# Patient Record
Sex: Male | Born: 1950
Health system: Southern US, Community
[De-identification: ages and names within clinical notes are randomized; demographics above are authoritative.]

## PROBLEM LIST (undated history)

## (undated) DIAGNOSIS — J40 Bronchitis, not specified as acute or chronic: Secondary | ICD-10-CM

## (undated) DIAGNOSIS — F5104 Psychophysiologic insomnia: Secondary | ICD-10-CM

## (undated) DIAGNOSIS — R609 Edema, unspecified: Secondary | ICD-10-CM

## (undated) DIAGNOSIS — M25512 Pain in left shoulder: Secondary | ICD-10-CM

## (undated) DIAGNOSIS — I10 Essential (primary) hypertension: Secondary | ICD-10-CM

## (undated) DIAGNOSIS — Z9889 Other specified postprocedural states: Secondary | ICD-10-CM

## (undated) DIAGNOSIS — Z87442 Personal history of urinary calculi: Secondary | ICD-10-CM

## (undated) DIAGNOSIS — M1990 Unspecified osteoarthritis, unspecified site: Secondary | ICD-10-CM

## (undated) DIAGNOSIS — I499 Cardiac arrhythmia, unspecified: Secondary | ICD-10-CM

## (undated) DIAGNOSIS — G4733 Obstructive sleep apnea (adult) (pediatric): Secondary | ICD-10-CM

## (undated) DIAGNOSIS — R030 Elevated blood-pressure reading, without diagnosis of hypertension: Secondary | ICD-10-CM

## (undated) DIAGNOSIS — I4891 Unspecified atrial fibrillation: Secondary | ICD-10-CM

## (undated) DIAGNOSIS — R6889 Other general symptoms and signs: Secondary | ICD-10-CM

## (undated) DIAGNOSIS — G8929 Other chronic pain: Secondary | ICD-10-CM

## (undated) DIAGNOSIS — R112 Nausea with vomiting, unspecified: Secondary | ICD-10-CM

## (undated) DIAGNOSIS — J449 Chronic obstructive pulmonary disease, unspecified: Secondary | ICD-10-CM

## (undated) DIAGNOSIS — J45909 Unspecified asthma, uncomplicated: Secondary | ICD-10-CM

## (undated) DIAGNOSIS — G4730 Sleep apnea, unspecified: Secondary | ICD-10-CM

## (undated) DIAGNOSIS — M1710 Unilateral primary osteoarthritis, unspecified knee: Secondary | ICD-10-CM

## (undated) HISTORY — DX: Other chronic pain: G89.29

## (undated) HISTORY — DX: Sleep apnea, unspecified: G47.30

## (undated) HISTORY — DX: Other general symptoms and signs: R68.89

## (undated) HISTORY — DX: Chronic obstructive pulmonary disease, unspecified: J44.9

## (undated) HISTORY — PX: OTHER SURGICAL HISTORY: SHX169

## (undated) HISTORY — DX: Pain in left shoulder: M25.512

## (undated) HISTORY — DX: Unilateral primary osteoarthritis, unspecified knee: M17.10

## (undated) HISTORY — DX: Unspecified asthma, uncomplicated: J45.909

## (undated) HISTORY — PX: BILATERAL KNEE ARTHROSCOPY: SUR91

## (undated) HISTORY — DX: Unspecified atrial fibrillation: I48.91

## (undated) HISTORY — DX: Elevated blood-pressure reading, without diagnosis of hypertension: R03.0

## (undated) HISTORY — PX: TONSILLECTOMY AND ADENOIDECTOMY: SHX28

## (undated) HISTORY — DX: Psychophysiologic insomnia: F51.04

## (undated) HISTORY — DX: Morbid (severe) obesity due to excess calories: E66.01

---

## 2002-05-05 ENCOUNTER — Emergency Department (HOSPITAL_COMMUNITY): Admission: EM | Admit: 2002-05-05 | Discharge: 2002-05-05 | Payer: Self-pay | Admitting: Emergency Medicine

## 2002-05-05 ENCOUNTER — Encounter: Payer: Self-pay | Admitting: Emergency Medicine

## 2004-10-08 ENCOUNTER — Ambulatory Visit (HOSPITAL_BASED_OUTPATIENT_CLINIC_OR_DEPARTMENT_OTHER): Admission: RE | Admit: 2004-10-08 | Discharge: 2004-10-08 | Payer: Self-pay | Admitting: Orthopedic Surgery

## 2004-10-08 ENCOUNTER — Ambulatory Visit (HOSPITAL_COMMUNITY): Admission: RE | Admit: 2004-10-08 | Discharge: 2004-10-08 | Payer: Self-pay | Admitting: Orthopedic Surgery

## 2007-04-10 ENCOUNTER — Encounter: Payer: Self-pay | Admitting: Internal Medicine

## 2007-11-30 ENCOUNTER — Ambulatory Visit (HOSPITAL_BASED_OUTPATIENT_CLINIC_OR_DEPARTMENT_OTHER): Admission: RE | Admit: 2007-11-30 | Discharge: 2007-11-30 | Payer: Self-pay | Admitting: Orthopedic Surgery

## 2008-02-26 ENCOUNTER — Encounter: Payer: Self-pay | Admitting: Internal Medicine

## 2008-02-29 ENCOUNTER — Encounter: Payer: Self-pay | Admitting: Pulmonary Disease

## 2008-02-29 DIAGNOSIS — R6889 Other general symptoms and signs: Secondary | ICD-10-CM

## 2008-03-14 ENCOUNTER — Ambulatory Visit: Payer: Self-pay | Admitting: Pulmonary Disease

## 2008-03-14 DIAGNOSIS — J449 Chronic obstructive pulmonary disease, unspecified: Secondary | ICD-10-CM

## 2008-03-14 DIAGNOSIS — J4489 Other specified chronic obstructive pulmonary disease: Secondary | ICD-10-CM

## 2008-03-14 HISTORY — DX: Chronic obstructive pulmonary disease, unspecified: J44.9

## 2008-03-14 HISTORY — DX: Other specified chronic obstructive pulmonary disease: J44.89

## 2008-04-10 ENCOUNTER — Ambulatory Visit: Payer: Self-pay | Admitting: Internal Medicine

## 2008-04-18 ENCOUNTER — Ambulatory Visit: Payer: Self-pay | Admitting: Internal Medicine

## 2008-05-23 ENCOUNTER — Ambulatory Visit: Payer: Self-pay | Admitting: Internal Medicine

## 2008-05-24 ENCOUNTER — Telehealth (INDEPENDENT_AMBULATORY_CARE_PROVIDER_SITE_OTHER): Payer: Self-pay | Admitting: *Deleted

## 2009-01-15 ENCOUNTER — Telehealth (INDEPENDENT_AMBULATORY_CARE_PROVIDER_SITE_OTHER): Payer: Self-pay | Admitting: *Deleted

## 2009-01-24 ENCOUNTER — Telehealth (INDEPENDENT_AMBULATORY_CARE_PROVIDER_SITE_OTHER): Payer: Self-pay | Admitting: *Deleted

## 2010-06-30 NOTE — Op Note (Signed)
NAME:  Brent Dougherty, Brent Dougherty NO.:  [id number]   MEDICAL RECORD NO.:  [id number]          PATIENT TYPE:  AMB   LOCATION:  DSC                          FACILITY:  [facility]   PHYSICIAN:  Loreta Ave, M.D. DATE OF BIRTH:  03/08/1950   DATE OF PROCEDURE:  11/30/2007  DATE OF DISCHARGE:                               OPERATIVE REPORT   PREOPERATIVE DIAGNOSIS:  Right knee medial meniscus tear.   POSTOPERATIVE DIAGNOSIS:  Right knee medial and lateral meniscus tears  with tricompartmental grade 3 chondromalacia and grade 4 changes of  lateral compartment.   PROCEDURES:  Right knee exam under anesthesia, arthroscopy, partial  medial and lateral meniscectomy.  Tricompartmental chondroplasty,  partial synovectomy.  Removal of chondral loose bodies.   SURGEON:  Loreta Ave, MD   ASSISTANT:  Genene Churn. Barry Dienes, PA   ANESTHESIA:  General.   ESTIMATED BLOOD LOSS:  Minimal.   SPECIMENS:  None.   COUNTS:  None.   COMPLICATIONS:  None.   DRESSING:  Sterile compressive.   TOURNIQUET:  Not employed.   PROCEDURE IN DETAIL:  The patient was brought to the operating room and  placed on operating table in supine position.  After adequate anesthesia  had been obtained, the knee was examined.  Because of his size, it was  difficult but eventually full motion with stable knee.  Tourniquet and  leg holder applied.  Leg prepped and draped in the usual sterile  fashion.  Three portals were created, one superolateral, one each medial  and lateral parapatellar.  Inflow catheter was introduced into the knee.  Standard arthroscope was introduced and the knee was inspected.  Extensive grade 3 changes in all 3 compartments.  Chondroplasty to a  stable surface in all compartments.  Most marked changes were laterally  where he actually has some grade 4 about the tibia and femur on the  medial half.  Medial meniscus, he had a previous partial medial  meniscectomy.  What was left on the  posterior middle third had some  degenerative tearing and fraying throughout.  Taken out to a stable rim  and tapered down smoothly.  Cruciate ligament was intact.  A lot of  periarticular spurring throughout the knee.  On the lateral side,  detachment of the posterior and lateral meniscus with intrameniscal  tearing throughout.  Most of posterior half removed and tapered into  remaining meniscus removing all uneven unstable flaps.  Chondroplasty to  a stable surface throughout.  At completion, all recess examined to make  sure all meniscal fragments and loose bodies were removed.  On the  lateral  side, the changes were on both sides.  The joint and microfracturing not  indicated.  Instruments and fluid removed.  Portals of knee injected  with Marcaine.  Portals were closed with 4-0 nylon.  Sterile compressive  dressing applied.  Anesthesia was reversed.  Brought to recovery room.  Tolerated the surgery well.  No complications.      Loreta Ave, M.D.  Electronically Signed     DFM/MEDQ  D:  11/30/2007  T:  11/30/2007  Job:  [id number]

## 2010-07-03 NOTE — Op Note (Signed)
NAME:  Brent Dougherty, Brent Dougherty NO.:  [id number]   MEDICAL RECORD NO.:  [id number]          PATIENT TYPE:  AMB   LOCATION:  DSC                          FACILITY:  [facility]   PHYSICIAN:  Loreta Ave, M.D. DATE OF BIRTH:  06-Jun-1950   DATE OF PROCEDURE:  10/08/2004  DATE OF DISCHARGE:                                 OPERATIVE REPORT   PREOPERATIVE DIAGNOSIS:  Medial meniscal tear, left knee.   POSTOPERATIVE DIAGNOSIS:  Medial meniscal tear, left knee, with fraying of  anterior horn of lateral meniscus and grade 2/mild grade 3 changes, lateral  compartment.  Also focal grade 2-3 chondromalacia of lateral patellar facet.   PROCEDURE:  Left knee exam under anesthesia, arthroscopy with debridement of  medial and lateral meniscus tear.  Chondroplasty of lateral tibial plateau  and patella.   SURGEON:  Loreta Ave, M.D.   ASSISTANT:  Genene Churn. Denton Meek.   ANESTHESIA:  Knee block with sedation.   SPECIMENS:  None.   CULTURES:  None.   COMPLICATIONS:  None.   DRESSINGS:  Soft compressive.   PROCEDURE:  Patient brought to the operating room and placed on operating  table in supine position.  After adequate anesthesia had been obtained, left  knee examined.  Full motion, good stability.  A little patellofemoral  crepitus and positive medial McMurray's.  Tourniquet and leg holder applied,  leg prepped and draped in the usual sterile fashion.  Three portals created,  one superolateral, one each medial and lateral parapatellar.  Inflow  catheter introduced, knee distended, arthroscope introduced, knee inspected.  Some hypertrophic grade 3 chondromalacia of lateral patellar facet debrided.  Trochlea looked good.  Remaining cartilage looked good.  Tracking good.  Cruciate ligament was intact.  Medial compartment had some grade 2 changes,  not extensive.  Marked complex tear of medial meniscus, entire posterior  half, with some of the fragments flipped up behind the  posterior cruciate  ligament in the notch.  Entire posterior half removed after a stable rim  tapered into remaining meniscus.  Laterally, there were some grade 3 changes  of the plateau debrided.  Frayed flap tears off the anterior horn, lateral  meniscus debrided.  Remaining meniscus entire.  Lateral femoral condyle  looked good.  At completion, all recess examined.  No findings  appreciated.  Instruments and fluid removed.  Portals in knee injected with  Marcaine.  Portals closed with 4-0 nylon.  Sterile compressive dressing  applied.  Anesthesia reversed.  Brought to recovery room.  Tolerated surgery  well with no complications.      Loreta Ave, M.D.  Electronically Signed     DFM/MEDQ  D:  10/08/2004  T:  10/09/2004  Job:  [id number]

## 2010-11-16 LAB — POCT HEMOGLOBIN-HEMACUE: Hemoglobin: 15.6

## 2011-11-18 ENCOUNTER — Other Ambulatory Visit: Payer: Self-pay | Admitting: Orthopedic Surgery

## 2011-11-18 MED ORDER — BUPIVACAINE 0.25 % ON-Q PUMP SINGLE CATH 300ML: 300.0000 mL | INJECTION | Status: DC

## 2011-11-18 MED ORDER — DEXAMETHASONE SODIUM PHOSPHATE 10 MG/ML IJ SOLN: 10.0000 mg | Freq: Once | INTRAMUSCULAR | Status: DC

## 2011-11-18 NOTE — Progress Notes (Signed)
Preoperative surgical orders have been place into the [facility] hospital system for Brent Dougherty on 11/18/2011, 2:09 PM  by Patrica Duel for surgery on 12/27/2011.  Preop Total Knee orders including Bupivacaine On-Q pump, IV Tylenol, and IV Decadron as long as there are no contraindications to the above medications. Avel Peace, PA-C

## 2012-02-07 ENCOUNTER — Other Ambulatory Visit: Payer: Self-pay | Admitting: Orthopedic Surgery

## 2012-02-07 MED ORDER — DEXAMETHASONE SODIUM PHOSPHATE 10 MG/ML IJ SOLN: 10.0000 mg | Freq: Once | INTRAMUSCULAR | Status: DC

## 2012-02-07 MED ORDER — BUPIVACAINE LIPOSOME 1.3 % IJ SUSP: 20.0000 mL | Freq: Once | INTRAMUSCULAR | Status: DC

## 2012-02-07 NOTE — Progress Notes (Signed)
Preoperative surgical orders have been place into the Epic hospital system for Brent Dougherty on 02/07/2012, 12:55 PM  by Patrica Duel for surgery on 03/08/2012.  Preop Total Knee orders including Experal, IV Tylenol, and IV Decadron as long as there are no contraindications to the above medications. Avel Peace, PA-C

## 2012-05-23 ENCOUNTER — Other Ambulatory Visit: Payer: Self-pay | Admitting: Orthopedic Surgery

## 2012-05-23 MED ORDER — TRANEXAMIC ACID 100 MG/ML IV SOLN: 1000.0000 mg | INTRAVENOUS | Status: DC

## 2012-05-31 NOTE — Progress Notes (Signed)
Need orders please - pt coming for preop 06/13/12 thank you 

## 2012-06-01 ENCOUNTER — Other Ambulatory Visit: Payer: Self-pay | Admitting: Orthopedic Surgery

## 2012-06-01 MED ORDER — DEXAMETHASONE SODIUM PHOSPHATE 10 MG/ML IJ SOLN: 10.0000 mg | Freq: Once | INTRAMUSCULAR | Status: DC

## 2012-06-01 MED ORDER — BUPIVACAINE LIPOSOME 1.3 % IJ SUSP: 20.0000 mL | Freq: Once | INTRAMUSCULAR | Status: DC

## 2012-06-01 NOTE — Progress Notes (Signed)
Preoperative surgical orders have been place into the Epic hospital system for Brent Dougherty on 06/01/2012, 1:01 PM  by Patrica Duel for surgery on 06/19/2012.  Preop Total Knee orders including Experal, IV Tylenol, and IV Decadron as long as there are no contraindications to the above medications. Avel Peace, PA-C

## 2012-06-02 ENCOUNTER — Encounter (HOSPITAL_COMMUNITY): Payer: Self-pay | Admitting: Pharmacy Technician

## 2012-06-13 ENCOUNTER — Encounter (HOSPITAL_COMMUNITY)
Admission: RE | Admit: 2012-06-13 | Discharge: 2012-06-13 | Disposition: A | Discharge status: Home / Self Care | Payer: BC Managed Care – PPO | Source: Ambulatory Visit | Attending: Orthopedic Surgery | Admitting: Orthopedic Surgery

## 2012-06-13 ENCOUNTER — Ambulatory Visit (HOSPITAL_COMMUNITY)
Admission: RE | Admit: 2012-06-13 | Discharge: 2012-06-13 | Disposition: A | Discharge status: Home / Self Care | Payer: BC Managed Care – PPO | Source: Ambulatory Visit | Attending: Orthopedic Surgery | Admitting: Orthopedic Surgery

## 2012-06-13 ENCOUNTER — Encounter (HOSPITAL_COMMUNITY): Payer: Self-pay

## 2012-06-13 DIAGNOSIS — Z01810 Encounter for preprocedural cardiovascular examination: Secondary | ICD-10-CM | POA: Insufficient documentation

## 2012-06-13 DIAGNOSIS — Z01818 Encounter for other preprocedural examination: Secondary | ICD-10-CM | POA: Insufficient documentation

## 2012-06-13 DIAGNOSIS — M47814 Spondylosis without myelopathy or radiculopathy, thoracic region: Secondary | ICD-10-CM | POA: Insufficient documentation

## 2012-06-13 DIAGNOSIS — I10 Essential (primary) hypertension: Secondary | ICD-10-CM | POA: Insufficient documentation

## 2012-06-13 HISTORY — DX: Other specified postprocedural states: Z98.890

## 2012-06-13 HISTORY — DX: Personal history of urinary calculi: Z87.442

## 2012-06-13 HISTORY — DX: Unspecified osteoarthritis, unspecified site: M19.90

## 2012-06-13 HISTORY — DX: Other specified postprocedural states: R11.2

## 2012-06-13 HISTORY — DX: Bronchitis, not specified as acute or chronic: J40

## 2012-06-13 LAB — COMPREHENSIVE METABOLIC PANEL
ALT: 18 U/L (ref 0–53)
AST: 18 U/L (ref 0–37)
Alkaline Phosphatase: 56 U/L (ref 39–117)
CO2: 29 mEq/L (ref 19–32)
Calcium: 9.7 mg/dL (ref 8.4–10.5)
GFR calc non Af Amer: >90 mL/min (ref >90)
Glucose, Bld: 93 mg/dL (ref 70–99)
Potassium: 4.4 mEq/L (ref 3.5–5.1)
Sodium: 140 mEq/L (ref 135–145)

## 2012-06-13 LAB — URINALYSIS, ROUTINE W REFLEX MICROSCOPIC
Glucose, UA: NEGATIVE mg/dL
Hgb urine dipstick: NEGATIVE
Ketones, ur: NEGATIVE mg/dL
Leukocytes, UA: NEGATIVE
pH: 6.5 (ref 5.0–8.0)

## 2012-06-13 LAB — APTT: aPTT: 33 seconds (ref 24–37)

## 2012-06-13 LAB — CBC
Hemoglobin: 15.2 g/dL (ref 13.0–17.0)
MCH: 29.8 pg (ref 26.0–34.0)
Platelets: 306 10*3/uL (ref 150–400)
RBC: 5.1 MIL/uL (ref 4.22–5.81)
WBC: 6.2 10*3/uL (ref 4.0–10.5)

## 2012-06-13 LAB — SURGICAL PCR SCREEN
MRSA, PCR: NEGATIVE
Staphylococcus aureus: POSITIVE — AB

## 2012-06-13 NOTE — Patient Instructions (Signed)
YOUR SURGERY IS SCHEDULED AT [facility]  ON:  Monday 5/5  REPORT TO [place] [facility] AT:  5:00 AM      PHONE # FOR SHORT STAY IS [phone]  DO NOT EAT OR DRINK ANYTHING AFTER MIDNIGHT THE NIGHT BEFORE YOUR SURGERY.  YOU MAY BRUSH YOUR TEETH, RINSE OUT YOUR MOUTH--BUT NO WATER, NO FOOD, NO CHEWING GUM, NO MINTS, NO CANDIES, NO CHEWING TOBACCO.  PLEASE TAKE THE FOLLOWING MEDICATIONS THE AM OF YOUR SURGERY WITH A FEW SIPS OF WATER:  NO MEDICINES TO TAKE    DO NOT BRING VALUABLES, MONEY, CREDIT CARDS.  DO NOT WEAR JEWELRY, MAKE-UP, NAIL POLISH AND NO METAL PINS OR CLIPS IN YOUR HAIR. CONTACT LENS, DENTURES / PARTIALS, GLASSES SHOULD NOT BE WORN TO SURGERY AND IN MOST CASES-HEARING AIDS WILL NEED TO BE REMOVED.  BRING YOUR GLASSES CASE, ANY EQUIPMENT NEEDED FOR YOUR CONTACT LENS. FOR PATIENTS ADMITTED TO THE HOSPITAL--CHECK OUT TIME THE DAY OF DISCHARGE IS 11:00 AM.  ALL INPATIENT ROOMS ARE PRIVATE - WITH BATHROOM, TELEPHONE, TELEVISION AND WIFI INTERNET.                              PLEASE READ OVER ANY  FACT SHEETS THAT YOU WERE GIVEN: MRSA INFORMATION, BLOOD TRANSFUSION INFORMATION, INCENTIVE SPIROMETER INFORMATION. FAILURE TO FOLLOW THESE INSTRUCTIONS MAY RESULT IN THE CANCELLATION OF YOUR SURGERY.   PATIENT SIGNATURE_________________________________

## 2012-06-13 NOTE — Progress Notes (Signed)
06/13/12 1627  OBSTRUCTIVE SLEEP APNEA  Have you ever been diagnosed with sleep apnea through a sleep study? No  Do you snore loudly (loud enough to be heard through closed doors)?  0  Do you often feel tired, fatigued, or sleepy during the daytime? 0  Has anyone observed you stop breathing during your sleep? 0  Do you have, or are you being treated for high blood pressure? 0  BMI more than 35 kg/m2? 1  Age over 61 years old? 1  Neck circumference greater than 40 cm/18 inches? 1  Gender: 1  Obstructive Sleep Apnea Score 4  Score 4 or greater  Results sent to PCP

## 2012-06-13 NOTE — Pre-Procedure Instructions (Signed)
EKG AND CXR WERE DONE TODAY - PREOP - AT [facility] - PT'S BMI 44.34, B/P ELEVATED AT 147/92 - NOT ON ANY B/P MEDICATIONS, HX OF PROBLEMS WITH BRONCHITIS.

## 2012-06-15 NOTE — Pre-Procedure Instructions (Signed)
NOTE OF MEDICAL CLEARANCE FROM DR. Andrey Campanile  FOR KNEE SURGERY -FAXED BY DR. ALUISIO'S OFFICE AND PLACED ON PT'S CHART.

## 2012-06-18 ENCOUNTER — Other Ambulatory Visit: Payer: Self-pay | Admitting: Orthopedic Surgery

## 2012-06-18 MED ORDER — TRANEXAMIC ACID 100 MG/ML IV SOLN: 1000.0000 mg | INTRAVENOUS | Status: DC

## 2012-06-18 NOTE — H&P (Signed)
Clarene Dougherty. Dura  DOB: December 07, 1950 Married / Language: English / Race: White Male  Date of Admission:  06/19/2012  Chief Complaint:  Right Knee Pain  History of Present Illness( The patient is a 62 year old male who comes in for a preoperative History and Physical. The patient is scheduled for a right total knee arthroplasty to be performed by Dr. Gus Rankin. Aluisio, MD at [facility] on 06/19/2012. The patient is a 62 year old male who presents with knee complaints. The patient is seen for a second opinion. The patient reports right knee symptoms including: pain and swelling which began 13 year(s) ago following a specific injury. Note for "Knee pain": He had a knee scope by Dr. Eulah Pont in 2009. More recently, he had a cortisone injection last June and was told he would need to have a total knee surgery. He was initially seen by Dr. Eulah Pont under workman's comp. He had the scope to try to prolong total knee replacement. He only got relief for a year with the scope. He had cortisone injections over the course of a year and 1/2. He states he had aspirations each time, but he would swell up the very next day. He is here now, under his personal insurance, seeking help. He states that the knee is getting progressively worse. It hurts at all times. It is starting to limit what he can and cannot do. The knee wants to give out at times. He has had cortisone and viscosupplement injections and they have not helped. He states that the knee occasionally will swell up on him. It feels like it wants to give out. It has not actually locked up or completely given out. Left knee hurts some but no where near as bad as the right. He is not having any hip pain, back pain, lower extremity weakness or paresthesia currently. He is ready to proceed with surgery. They have been treated conservatively in the past for the above stated problem and despite conservative measures, they continue to have  progressive pain and severe functional limitations and dysfunction. They have failed non-operative management including home exercise, medications, and injections. It is felt that they would benefit from undergoing total joint replacement. Risks and benefits of the procedure have been discussed with the patient and they elect to proceed with surgery. There are no active contraindications to surgery such as ongoing infection or rapidly progressive neurological disease.     Problem List Osteoarthritis, Knee (715.96)   Allergies Morphine Sulfate (Concentrate) *ANALGESICS - OPIOID*. Mild Nausea   Family History Rheumatoid Arthritis. sister Heart disease in male family member before age 54 Heart Disease. 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 4-5 Marital status. married Illicit drug use. no Living situation. live with spouse Tobacco use. never smoker Pain Contract. no Previously in rehab. no Drug/Alcohol Rehab (Currently). no Exercise. Exercises weekly; does other Current work status. working full time Alcohol use. never consumed alcohol Children. 3   Medication History Advil (200MG  Capsule, Oral) Active. (PRN)   Past Surgical History Arthroscopy of Knee. bilateral   Medical History Kidney Stone Bronchitis Measles   Review of Systems General:Not Present- Chills, Fever, Night Sweats, Fatigue, Weight Gain, Weight Loss and Memory Loss. Skin:Not Present- Hives, Itching, Rash, Eczema and Lesions. HEENT:Not Present- Tinnitus, Headache, Double Vision, Visual Loss, Hearing Loss and Dentures. Respiratory:Not Present- Shortness of breath with exertion, Shortness of breath at rest, Allergies,  Coughing up blood and Chronic Cough. Cardiovascular:Not Present- Chest Pain, Racing/skipping heartbeats, Difficulty Breathing Lying Down, Murmur, Swelling  and Palpitations. Gastrointestinal:Not Present- Bloody Stool, Heartburn, Abdominal Pain, Vomiting, Nausea, Constipation, Diarrhea, Difficulty Swallowing, Jaundice and Loss of appetitie. Male Genitourinary:Not Present- Urinary frequency, Blood in Urine, Weak urinary stream, Discharge, Flank Pain, Incontinence, Painful Urination, Urgency, Urinary Retention and Urinating at Night. Musculoskeletal:Present- Joint Swelling and Joint Pain. Not Present- Muscle Weakness, Muscle Pain, Back Pain, Morning Stiffness and Spasms. Neurological:Not Present- Tremor, Dizziness, Blackout spells, Paralysis, Difficulty with balance and Weakness. Psychiatric:Not Present- Insomnia.   Vitals Weight: 335 lb Height: 72 in Weight was reported by patient. Height was reported by patient. Body Surface Area: 2.78 m Body Mass Index: 45.43 kg/m Pulse: 64 (Regular) Resp.: 12 (Unlabored) BP: 150/78 (Sitting, Right Arm, Standard)    Physical Exam The physical exam findings are as follows:  Note: Patient is a 62 year old male with continued knee pain. Patient is accompanied today by his wife, Neysa Bonito.   General Mental Status - Alert, cooperative and good historian. General Appearance- pleasant. Not in acute distress. Orientation- Oriented X3. Build & Nutrition- Well nourished and Well developed.   Head and Neck Head- normocephalic, atraumatic . Neck Global Assessment- supple. no bruit auscultated on the right and no bruit auscultated on the left.   Eye Pupil- Bilateral- Regular and Round. Motion- Bilateral- EOMI.   Chest and Lung Exam Auscultation: Breath sounds:- clear at anterior chest wall and - clear at posterior chest wall. Adventitious sounds:- No Adventitious sounds.   Cardiovascular Auscultation:Rhythm- Regular rate and rhythm. Heart Sounds- S1 WNL and S2 WNL. Murmurs & Other Heart Sounds:Auscultation of the heart reveals - No  Murmurs.   Abdomen Inspection:Contour- Generalized moderate distention. Palpation/Percussion:Tenderness- Abdomen is non-tender to palpation. Rigidity (guarding)- Abdomen is soft. Auscultation:Auscultation of the abdomen reveals - Bowel sounds normal.   Male Genitourinary Not done, not pertinent to present illness  Musculoskeletal Alert and oriented in no apparent distress. Both hips have normal range of motion with no discomfort. Left knee no effusion. Range is 0-125. Minimal crepitus on range of motion. He has no joint line tenderness or instability. Right knee no effusion. There is some soft tissue thickening about the knee. Range is about 5-115. Marked crepitus on range of motion with tenderness medial greater than lateral and no instability noted. He has 1+ peripheral edema both legs. There is no evidence of any stasis changes.  RADIOGRAPHS: AP both knees and lateral show advanced end stage arthritis of the right knee. He is bone on bone medial and significant narrowing laterally and bone on bone patellofemoral. The left knee has some changes but no where near as bad.  Assessment & Plan Osteoarthritis, Knee (715.96) Impression: Right Knee  Note: Plan is for a Right Total Knee Replacement by Dr. Lequita Halt.  Plan is to go home.   Signed electronically by Roberts Gaudy, PA-C

## 2012-06-19 ENCOUNTER — Encounter (HOSPITAL_COMMUNITY): Payer: Self-pay | Admitting: *Deleted

## 2012-06-19 ENCOUNTER — Inpatient Hospital Stay (HOSPITAL_COMMUNITY): Payer: BC Managed Care – PPO | Admitting: *Deleted

## 2012-06-19 ENCOUNTER — Inpatient Hospital Stay (HOSPITAL_COMMUNITY)
Admission: RE | Admit: 2012-06-19 | Discharge: 2012-06-21 | DRG: 209 | Disposition: A | Discharge status: Home Health | Payer: BC Managed Care – PPO | Source: Ambulatory Visit | Attending: Orthopedic Surgery | Admitting: Orthopedic Surgery

## 2012-06-19 ENCOUNTER — Encounter (HOSPITAL_COMMUNITY)
Admission: RE | Disposition: A | Discharge status: Home Health | Payer: Self-pay | Source: Ambulatory Visit | Attending: Orthopedic Surgery

## 2012-06-19 DIAGNOSIS — M1710 Unilateral primary osteoarthritis, unspecified knee: Principal | ICD-10-CM | POA: Diagnosis present

## 2012-06-19 DIAGNOSIS — M179 Osteoarthritis of knee, unspecified: Secondary | ICD-10-CM | POA: Diagnosis present

## 2012-06-19 DIAGNOSIS — Z96651 Presence of right artificial knee joint: Secondary | ICD-10-CM

## 2012-06-19 HISTORY — PX: TOTAL KNEE ARTHROPLASTY: SHX125

## 2012-06-19 LAB — TYPE AND SCREEN: ABO/RH(D): A POS

## 2012-06-19 LAB — ABO/RH: ABO/RH(D): A POS

## 2012-06-19 SURGERY — ARTHROPLASTY, KNEE, TOTAL
Anesthesia: Spinal | Site: Knee | Laterality: Right | Wound class: Clean

## 2012-06-19 MED ORDER — ONDANSETRON HCL 4 MG PO TABS: 4.0000 mg | ORAL_TABLET | Freq: Four times a day (QID) | ORAL | Status: DC | PRN

## 2012-06-19 MED ORDER — OXYCODONE HCL 5 MG PO TABS
5.0000 mg | ORAL_TABLET | ORAL | Status: DC | PRN
  Administered 2012-06-19: 10 mg via ORAL
  Administered 2012-06-19 (×2): 5 mg via ORAL
  Administered 2012-06-19 – 2012-06-20 (×7): 10 mg via ORAL
  Administered 2012-06-21 (×2): 5 mg via ORAL
  Filled 2012-06-19 (×2): qty 2
  Filled 2012-06-19: qty 1
  Filled 2012-06-19 (×3): qty 2
  Filled 2012-06-19: qty 1
  Filled 2012-06-19 (×2): qty 2
  Filled 2012-06-19 (×2): qty 1
  Filled 2012-06-19 (×2): qty 2

## 2012-06-19 MED ORDER — METHOCARBAMOL 500 MG PO TABS
500.0000 mg | ORAL_TABLET | Freq: Four times a day (QID) | ORAL | Status: DC | PRN
  Administered 2012-06-19 – 2012-06-21 (×6): 500 mg via ORAL
  Filled 2012-06-19 (×6): qty 1

## 2012-06-19 MED ORDER — METOCLOPRAMIDE HCL 10 MG PO TABS: 5.0000 mg | ORAL_TABLET | Freq: Three times a day (TID) | ORAL | Status: DC | PRN

## 2012-06-19 MED ORDER — FLEET ENEMA 7-19 GM/118ML RE ENEM: 1.0000 | ENEMA | Freq: Once | RECTAL | Status: AC | PRN

## 2012-06-19 MED ORDER — PHENOL 1.4 % MT LIQD: 1.0000 | OROMUCOSAL | Status: DC | PRN

## 2012-06-19 MED ORDER — FENTANYL CITRATE 0.05 MG/ML IJ SOLN
INTRAMUSCULAR | Status: DC | PRN
  Administered 2012-06-19: 100 ug via INTRAVENOUS

## 2012-06-19 MED ORDER — DEXAMETHASONE SODIUM PHOSPHATE 10 MG/ML IJ SOLN
10.0000 mg | Freq: Every day | INTRAMUSCULAR | Status: AC
  Administered 2012-06-20: 10 mg via INTRAVENOUS
  Filled 2012-06-19: qty 1

## 2012-06-19 MED ORDER — CEFAZOLIN SODIUM-DEXTROSE 2-3 GM-% IV SOLR
2.0000 g | Freq: Four times a day (QID) | INTRAVENOUS | Status: AC
  Administered 2012-06-19 (×2): 2 g via INTRAVENOUS
  Filled 2012-06-19 (×2): qty 50

## 2012-06-19 MED ORDER — ACETAMINOPHEN 10 MG/ML IV SOLN
1000.0000 mg | Freq: Once | INTRAVENOUS | Status: AC
  Administered 2012-06-19: 1000 mg via INTRAVENOUS

## 2012-06-19 MED ORDER — BISACODYL 10 MG RE SUPP: 10.0000 mg | Freq: Every day | RECTAL | Status: DC | PRN

## 2012-06-19 MED ORDER — SODIUM CHLORIDE 0.9 % IR SOLN
Status: DC | PRN
  Administered 2012-06-19: 1000 mL

## 2012-06-19 MED ORDER — TRAMADOL HCL 50 MG PO TABS: 50.0000 mg | ORAL_TABLET | Freq: Four times a day (QID) | ORAL | Status: DC | PRN

## 2012-06-19 MED ORDER — RIVAROXABAN 10 MG PO TABS
10.0000 mg | ORAL_TABLET | Freq: Every day | ORAL | Status: DC
  Administered 2012-06-20 – 2012-06-21 (×2): 10 mg via ORAL
  Filled 2012-06-19 (×3): qty 1

## 2012-06-19 MED ORDER — DOCUSATE SODIUM 100 MG PO CAPS
100.0000 mg | ORAL_CAPSULE | Freq: Two times a day (BID) | ORAL | Status: DC
  Administered 2012-06-19 – 2012-06-21 (×5): 100 mg via ORAL

## 2012-06-19 MED ORDER — DEXAMETHASONE 6 MG PO TABS
10.0000 mg | ORAL_TABLET | Freq: Every day | ORAL | Status: AC
  Filled 2012-06-19: qty 1

## 2012-06-19 MED ORDER — PROPOFOL 10 MG/ML IV BOLUS
INTRAVENOUS | Status: DC | PRN
  Administered 2012-06-19: 50 mg via INTRAVENOUS

## 2012-06-19 MED ORDER — MEPERIDINE HCL 50 MG/ML IJ SOLN: 6.2500 mg | INTRAMUSCULAR | Status: DC | PRN

## 2012-06-19 MED ORDER — LACTATED RINGERS IV SOLN: INTRAVENOUS | Status: DC

## 2012-06-19 MED ORDER — ONDANSETRON HCL 4 MG/2ML IJ SOLN
4.0000 mg | Freq: Four times a day (QID) | INTRAMUSCULAR | Status: DC | PRN
  Administered 2012-06-19: 4 mg via INTRAVENOUS
  Filled 2012-06-19: qty 2

## 2012-06-19 MED ORDER — POLYETHYLENE GLYCOL 3350 17 G PO PACK: 17.0000 g | PACK | Freq: Every day | ORAL | Status: DC | PRN

## 2012-06-19 MED ORDER — BUPIVACAINE HCL 0.25 % IJ SOLN
INTRAMUSCULAR | Status: DC | PRN
  Administered 2012-06-19: 20 mL

## 2012-06-19 MED ORDER — PROPOFOL 10 MG/ML IV EMUL
INTRAVENOUS | Status: DC | PRN
  Administered 2012-06-19: 100 ug/kg/min via INTRAVENOUS

## 2012-06-19 MED ORDER — DIPHENHYDRAMINE HCL 12.5 MG/5ML PO ELIX: 12.5000 mg | ORAL_SOLUTION | ORAL | Status: DC | PRN

## 2012-06-19 MED ORDER — MENTHOL 3 MG MT LOZG: 1.0000 | LOZENGE | OROMUCOSAL | Status: DC | PRN

## 2012-06-19 MED ORDER — HYDROMORPHONE HCL PF 1 MG/ML IJ SOLN
0.5000 mg | INTRAMUSCULAR | Status: DC | PRN
  Administered 2012-06-19: 0.5 mg via INTRAVENOUS
  Administered 2012-06-19: 1 mg via INTRAVENOUS
  Filled 2012-06-19 (×2): qty 1

## 2012-06-19 MED ORDER — BUPIVACAINE LIPOSOME 1.3 % IJ SUSP
20.0000 mL | Freq: Once | INTRAMUSCULAR | Status: DC
  Filled 2012-06-19: qty 20

## 2012-06-19 MED ORDER — DEXTROSE 5 % IV SOLN
3.0000 g | INTRAVENOUS | Status: AC
  Administered 2012-06-19: 3 g via INTRAVENOUS
  Filled 2012-06-19: qty 3000

## 2012-06-19 MED ORDER — ACETAMINOPHEN 650 MG RE SUPP: 650.0000 mg | Freq: Four times a day (QID) | RECTAL | Status: DC | PRN

## 2012-06-19 MED ORDER — HYDROMORPHONE HCL PF 1 MG/ML IJ SOLN: 0.2500 mg | INTRAMUSCULAR | Status: DC | PRN

## 2012-06-19 MED ORDER — DEXTROSE-NACL 5-0.9 % IV SOLN
INTRAVENOUS | Status: DC
  Administered 2012-06-19 – 2012-06-20 (×3): via INTRAVENOUS

## 2012-06-19 MED ORDER — ACETAMINOPHEN 10 MG/ML IV SOLN
1000.0000 mg | Freq: Four times a day (QID) | INTRAVENOUS | Status: AC
  Administered 2012-06-19 – 2012-06-20 (×4): 1000 mg via INTRAVENOUS
  Filled 2012-06-19 (×9): qty 100

## 2012-06-19 MED ORDER — PROMETHAZINE HCL 25 MG/ML IJ SOLN: 6.2500 mg | INTRAMUSCULAR | Status: DC | PRN

## 2012-06-19 MED ORDER — CHLORHEXIDINE GLUCONATE 4 % EX LIQD
60.0000 mL | Freq: Once | CUTANEOUS | Status: DC
  Filled 2012-06-19: qty 60

## 2012-06-19 MED ORDER — SODIUM CHLORIDE 0.9 % IJ SOLN
INTRAMUSCULAR | Status: DC | PRN
  Administered 2012-06-19: 08:00:00

## 2012-06-19 MED ORDER — ACETAMINOPHEN 325 MG PO TABS: 650.0000 mg | ORAL_TABLET | Freq: Four times a day (QID) | ORAL | Status: DC | PRN

## 2012-06-19 MED ORDER — METHOCARBAMOL 100 MG/ML IJ SOLN: 500.0000 mg | Freq: Four times a day (QID) | INTRAVENOUS | Status: DC | PRN

## 2012-06-19 MED ORDER — SODIUM CHLORIDE 0.9 % IV SOLN: INTRAVENOUS | Status: DC

## 2012-06-19 MED ORDER — TRANEXAMIC ACID 100 MG/ML IV SOLN
1000.0000 mg | INTRAVENOUS | Status: DC
  Filled 2012-06-19: qty 10

## 2012-06-19 MED ORDER — METOCLOPRAMIDE HCL 5 MG/ML IJ SOLN: 5.0000 mg | Freq: Three times a day (TID) | INTRAMUSCULAR | Status: DC | PRN

## 2012-06-19 MED ORDER — SCOPOLAMINE 1 MG/3DAYS TD PT72
MEDICATED_PATCH | TRANSDERMAL | Status: DC | PRN
  Administered 2012-06-19: 1 via TRANSDERMAL

## 2012-06-19 MED ORDER — 0.9 % SODIUM CHLORIDE (POUR BTL) OPTIME
TOPICAL | Status: DC | PRN
  Administered 2012-06-19: 1000 mL

## 2012-06-19 MED ORDER — MIDAZOLAM HCL 5 MG/5ML IJ SOLN
INTRAMUSCULAR | Status: DC | PRN
  Administered 2012-06-19 (×2): 2 mg via INTRAVENOUS

## 2012-06-19 MED ORDER — BUPIVACAINE IN DEXTROSE 0.75-8.25 % IT SOLN
INTRATHECAL | Status: DC | PRN
  Administered 2012-06-19: 2 mL via INTRATHECAL

## 2012-06-19 MED ORDER — LACTATED RINGERS IV SOLN
INTRAVENOUS | Status: DC | PRN
  Administered 2012-06-19 (×2): via INTRAVENOUS

## 2012-06-19 SURGICAL SUPPLY — 53 items
BAG SPEC THK2 15X12 ZIP CLS (MISCELLANEOUS) ×1
BAG ZIPLOCK 12X15 (MISCELLANEOUS) ×2 IMPLANT
BANDAGE ELASTIC 6 VELCRO ST LF (GAUZE/BANDAGES/DRESSINGS) ×2 IMPLANT
BANDAGE ESMARK 6X9 LF (GAUZE/BANDAGES/DRESSINGS) ×1 IMPLANT
BLADE SAG 18X100X1.27 (BLADE) ×2 IMPLANT
BLADE SAW SGTL 11.0X1.19X90.0M (BLADE) ×2 IMPLANT
BNDG CMPR 9X6 STRL LF SNTH (GAUZE/BANDAGES/DRESSINGS) ×1
BNDG ESMARK 6X9 LF (GAUZE/BANDAGES/DRESSINGS) ×2
BOWL SMART MIX CTS (DISPOSABLE) ×2 IMPLANT
CEMENT HV SMART SET (Cement) ×4 IMPLANT
CLOTH BEACON ORANGE TIMEOUT ST (SAFETY) ×2 IMPLANT
CUFF TOURN SGL QUICK 34 (TOURNIQUET CUFF) ×2
CUFF TRNQT CYL 34X4X40X1 (TOURNIQUET CUFF) ×1 IMPLANT
DRAPE EXTREMITY T 121X128X90 (DRAPE) ×2 IMPLANT
DRAPE POUCH INSTRU U-SHP 10X18 (DRAPES) ×2 IMPLANT
DRAPE U-SHAPE 47X51 STRL (DRAPES) ×2 IMPLANT
DRSG ADAPTIC 3X8 NADH LF (GAUZE/BANDAGES/DRESSINGS) ×2 IMPLANT
DRSG PAD ABDOMINAL 8X10 ST (GAUZE/BANDAGES/DRESSINGS) ×1 IMPLANT
DURAPREP 26ML APPLICATOR (WOUND CARE) ×2 IMPLANT
ELECT REM PT RETURN 9FT ADLT (ELECTROSURGICAL) ×2
ELECTRODE REM PT RTRN 9FT ADLT (ELECTROSURGICAL) ×1 IMPLANT
EVACUATOR 1/8 PVC DRAIN (DRAIN) ×2 IMPLANT
FACESHIELD LNG OPTICON STERILE (SAFETY) ×10 IMPLANT
GLOVE BIO SURGEON STRL SZ8 (GLOVE) ×2 IMPLANT
GLOVE BIOGEL PI IND STRL 8 (GLOVE) ×2 IMPLANT
GLOVE BIOGEL PI INDICATOR 8 (GLOVE) ×1
GLOVE SURG SS PI 6.5 STRL IVOR (GLOVE) ×4 IMPLANT
GOWN STRL NON-REIN LRG LVL3 (GOWN DISPOSABLE) ×4 IMPLANT
GOWN STRL REIN XL XLG (GOWN DISPOSABLE) ×3 IMPLANT
HANDPIECE INTERPULSE COAX TIP (DISPOSABLE) ×2
IMMOBILIZER KNEE 22 UNIV (SOFTGOODS) ×1 IMPLANT
KIT BASIN OR (CUSTOM PROCEDURE TRAY) ×2 IMPLANT
MANIFOLD NEPTUNE II (INSTRUMENTS) ×2 IMPLANT
NDL SAFETY ECLIPSE 18X1.5 (NEEDLE) ×1 IMPLANT
NEEDLE HYPO 18GX1.5 SHARP (NEEDLE) ×2
NS IRRIG 1000ML POUR BTL (IV SOLUTION) ×2 IMPLANT
PACK TOTAL JOINT (CUSTOM PROCEDURE TRAY) ×2 IMPLANT
PADDING CAST COTTON 6X4 STRL (CAST SUPPLIES) ×6 IMPLANT
POSITIONER SURGICAL ARM (MISCELLANEOUS) ×2 IMPLANT
SET HNDPC FAN SPRY TIP SCT (DISPOSABLE) ×1 IMPLANT
SPONGE GAUZE 4X4 12PLY (GAUZE/BANDAGES/DRESSINGS) ×2 IMPLANT
STRIP CLOSURE SKIN 1/2X4 (GAUZE/BANDAGES/DRESSINGS) ×4 IMPLANT
SUCTION FRAZIER 12FR DISP (SUCTIONS) ×2 IMPLANT
SUT MNCRL AB 4-0 PS2 18 (SUTURE) ×2 IMPLANT
SUT VIC AB 2-0 CT1 27 (SUTURE) ×6
SUT VIC AB 2-0 CT1 TAPERPNT 27 (SUTURE) ×3 IMPLANT
SUT VLOC 180 0 24IN GS25 (SUTURE) ×2 IMPLANT
SYR 20CC LL (SYRINGE) ×1 IMPLANT
SYR 50ML LL SCALE MARK (SYRINGE) ×2 IMPLANT
TOWEL OR 17X26 10 PK STRL BLUE (TOWEL DISPOSABLE) ×4 IMPLANT
TRAY FOLEY CATH 14FRSI W/METER (CATHETERS) ×2 IMPLANT
WATER STERILE IRR 1500ML POUR (IV SOLUTION) ×2 IMPLANT
WRAP KNEE MAXI GEL POST OP (GAUZE/BANDAGES/DRESSINGS) ×3 IMPLANT

## 2012-06-19 NOTE — Interval H&P Note (Signed)
History and Physical Interval Note:  06/19/2012 6:56 AM  Brent Dougherty  has presented today for surgery, with the diagnosis of osteoarthritis right knee  The various methods of treatment have been discussed with the patient and family. After consideration of risks, benefits and other options for treatment, the patient has consented to  Procedure(s): RIGHT TOTAL KNEE ARTHROPLASTY (Right) as a surgical intervention .  The patient's history has been reviewed, patient examined, no change in status, stable for surgery.  I have reviewed the patient's chart and labs.  Questions were answered to the patient's satisfaction.     [name]

## 2012-06-19 NOTE — Op Note (Signed)
Pre-operative diagnosis- Osteoarthritis  Right knee(s)  Post-operative diagnosis- Osteoarthritis Right knee(s)  Procedure-  Right  Total Knee Arthroplasty  Surgeon- Gus Rankin. Reshunda Strider, MD  Assistant- Dimitri Ped, PA-C   Anesthesia-  Spinal EBL-* No blood loss amount entered *  Drains Hemovac  Tourniquet time-  Total Tourniquet Time Documented: Thigh (Right) - 41 minutes Total: Thigh (Right) - 41 minutes    Complications- None  Condition-PACU - hemodynamically stable.   Brief Clinical Note  Brent Dougherty is a 62 y.o. year old male with end stage OA of his right knee with progressively worsening pain and dysfunction. He has constant pain, with activity and at rest and significant functional deficits with difficulties even with ADLs. He has had extensive non-op management including analgesics, injections of cortisone, and home exercise program, but remains in significant pain with significant dysfunction. Radiographs show bone on bone arthritis all 3 compartmentsHe presents now for right Total Knee Arthroplasty.    Procedure in detail---   The patient is brought into the operating room and positioned supine on the operating table. After successful administration of  Spinal,   a tourniquet is placed high on the  Right thigh(s) and the lower extremity is prepped and draped in the usual sterile fashion. Time out is performed by the operating team and then the  Right lower extremity is wrapped in Esmarch, knee flexed and the tourniquet inflated to 300 mmHg.       A midline incision is made with a ten blade through the subcutaneous tissue to the level of the extensor mechanism. A fresh blade is used to make a medial parapatellar arthrotomy. Soft tissue over the proximal medial tibia is subperiosteally elevated to the joint line with a knife and into the semimembranosus bursa with a Cobb elevator. Soft tissue over the proximal lateral tibia is elevated with attention being paid to avoiding the  patellar tendon on the tibial tubercle. The patella is everted, knee flexed 90 degrees and the ACL and PCL are removed. Findings are bone on bone all 3 compartments with large global osteophytes.        The drill is used to create a starting hole in the distal femur and the canal is thoroughly irrigated with sterile saline to remove the fatty contents. The 5 degree Right  valgus alignment guide is placed into the femoral canal and the distal femoral cutting block is pinned to remove 11 mm off the distal femur. Resection is made with an oscillating saw.      The tibia is subluxed forward and the menisci are removed. The extramedullary alignment guide is placed referencing proximally at the medial aspect of the tibial tubercle and distally along the second metatarsal axis and tibial crest. The block is pinned to remove 2mm off the more deficient medial  side. Resection is made with an oscillating saw. Size 5is the most appropriate size for the tibia and the proximal tibia is prepared with the modular drill and keel punch for that size.      The femoral sizing guide is placed and size 5 is most appropriate. Rotation is marked off the epicondylar axis and confirmed by creating a rectangular flexion gap at 90 degrees. The size 5 cutting block is pinned in this rotation and the anterior, posterior and chamfer cuts are made with the oscillating saw. The intercondylar block is then placed and that cut is made.      Trial size 5 tibial component, trial size 5 posterior stabilized femur  and a 10  mm posterior stabilized rotating platform insert trial is placed. Full extension is achieved with excellent varus/valgus and anterior/posterior balance throughout full range of motion. The patella is everted and thickness measured to be 27  mm. Free hand resection is taken to 15 mm, a 41 template is placed, lug holes are drilled, trial patella is placed, and it tracks normally. Osteophytes are removed off the posterior femur with  the trial in place. All trials are removed and the cut bone surfaces prepared with pulsatile lavage. Cement is mixed and once ready for implantation, the size 5 tibial implant, size  5 posterior stabilized femoral component, and the size 41 patella are cemented in place and the patella is held with the clamp. The trial insert is placed and the knee held in full extension. The Exparel (20 ml mixed with 50 ml saline) is injected into the extensor mechanism, posterior capsule, medial and lateral gutters and subcutaneous tissues.  All extruded cement is removed and once the cement is hard the permanent 10 mm posterior stabilized rotating platform insert is placed into the tibial tray.      The wound is copiously irrigated with saline solution and the extensor mechanism closed over a hemovac drain with #1 PDS suture. The tourniquet is released for a total tourniquet time of 40  minutes. Flexion against gravity is 140 degrees and the patella tracks normally. Subcutaneous tissue is closed with 2.0 vicryl and subcuticular with running 4.0 Monocryl. The incision is cleaned and dried and steri-strips and a bulky sterile dressing are applied. The limb is placed into a knee immobilizer and the patient is awakened and transported to recovery in stable condition.      Please note that a surgical assistant was a medical necessity for this procedure in order to perform it in a safe and expeditious manner. Surgical assistant was necessary to retract the ligaments and vital neurovascular structures to prevent injury to them and also necessary for proper positioning of the limb to allow for anatomic placement of the prosthesis.   Gus Rankin Brent Mcelhiney, MD    06/19/2012, 8:14 AM

## 2012-06-19 NOTE — H&P (View-Only) (Signed)
Brent Dougherty  DOB: 03/21/1950 Married / Language: English / Race: White Male  Date of Admission:  06/19/2012  Chief Complaint:  Right Knee Pain  History of Present Illness( The patient is a 62 year old male who comes in for a preoperative History and Physical. The patient is scheduled for a right total knee arthroplasty to be performed by Dr. Frank V. Aluisio, MD at [facility] on 06/19/2012. The patient is a 62 year old male who presents with knee complaints. The patient is seen for a second opinion. The patient reports right knee symptoms including: pain and swelling which began 13 year(s) ago following a specific injury. Note for "Knee pain": He had a knee scope by Dr. Murphy in 2009. More recently, he had a cortisone injection last June and was told he would need to have a total knee surgery. He was initially seen by Dr. Murphy under workman's comp. He had the scope to try to prolong total knee replacement. He only got relief for a year with the scope. He had cortisone injections over the course of a year and 1/2. He states he had aspirations each time, but he would swell up the very next day. He is here now, under his personal insurance, seeking help. He states that the knee is getting progressively worse. It hurts at all times. It is starting to limit what he can and cannot do. The knee wants to give out at times. He has had cortisone and viscosupplement injections and they have not helped. He states that the knee occasionally will swell up on him. It feels like it wants to give out. It has not actually locked up or completely given out. Left knee hurts some but no where near as bad as the right. He is not having any hip pain, back pain, lower extremity weakness or paresthesia currently. He is ready to proceed with surgery. They have been treated conservatively in the past for the above stated problem and despite conservative measures, they continue to have  progressive pain and severe functional limitations and dysfunction. They have failed non-operative management including home exercise, medications, and injections. It is felt that they would benefit from undergoing total joint replacement. Risks and benefits of the procedure have been discussed with the patient and they elect to proceed with surgery. There are no active contraindications to surgery such as ongoing infection or rapidly progressive neurological disease.     Problem List Osteoarthritis, Knee (715.96)   Allergies Morphine Sulfate (Concentrate) *ANALGESICS - OPIOID*. Mild Nausea   Family History Rheumatoid Arthritis. sister Heart disease in male family member before age 55 Heart Disease. 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 4-5 Marital status. married Illicit drug use. no Living situation. live with spouse Tobacco use. never smoker Pain Contract. no Previously in rehab. no Drug/Alcohol Rehab (Currently). no Exercise. Exercises weekly; does other Current work status. working full time Alcohol use. never consumed alcohol Children. 3   Medication History Advil (200MG Capsule, Oral) Active. (PRN)   Past Surgical History Arthroscopy of Knee. bilateral   Medical History Kidney Stone Bronchitis Measles   Review of Systems General:Not Present- Chills, Fever, Night Sweats, Fatigue, Weight Gain, Weight Loss and Memory Loss. Skin:Not Present- Hives, Itching, Rash, Eczema and Lesions. HEENT:Not Present- Tinnitus, Headache, Double Vision, Visual Loss, Hearing Loss and Dentures. Respiratory:Not Present- Shortness of breath with exertion, Shortness of breath at rest, Allergies,   Coughing up blood and Chronic Cough. Cardiovascular:Not Present- Chest Pain, Racing/skipping heartbeats, Difficulty Breathing Lying Down, Murmur, Swelling  and Palpitations. Gastrointestinal:Not Present- Bloody Stool, Heartburn, Abdominal Pain, Vomiting, Nausea, Constipation, Diarrhea, Difficulty Swallowing, Jaundice and Loss of appetitie. Male Genitourinary:Not Present- Urinary frequency, Blood in Urine, Weak urinary stream, Discharge, Flank Pain, Incontinence, Painful Urination, Urgency, Urinary Retention and Urinating at Night. Musculoskeletal:Present- Joint Swelling and Joint Pain. Not Present- Muscle Weakness, Muscle Pain, Back Pain, Morning Stiffness and Spasms. Neurological:Not Present- Tremor, Dizziness, Blackout spells, Paralysis, Difficulty with balance and Weakness. Psychiatric:Not Present- Insomnia.   Vitals Weight: 335 lb Height: 72 in Weight was reported by patient. Height was reported by patient. Body Surface Area: 2.78 m Body Mass Index: 45.43 kg/m Pulse: 64 (Regular) Resp.: 12 (Unlabored) BP: 150/78 (Sitting, Right Arm, Standard)    Physical Exam The physical exam findings are as follows:  Note: Patient is a 62 year old male with continued knee pain. Patient is accompanied today by his wife, Christy.   General Mental Status - Alert, cooperative and good historian. General Appearance- pleasant. Not in acute distress. Orientation- Oriented X3. Build & Nutrition- Well nourished and Well developed.   Head and Neck Head- normocephalic, atraumatic . Neck Global Assessment- supple. no bruit auscultated on the right and no bruit auscultated on the left.   Eye Pupil- Bilateral- Regular and Round. Motion- Bilateral- EOMI.   Chest and Lung Exam Auscultation: Breath sounds:- clear at anterior chest wall and - clear at posterior chest wall. Adventitious sounds:- No Adventitious sounds.   Cardiovascular Auscultation:Rhythm- Regular rate and rhythm. Heart Sounds- S1 WNL and S2 WNL. Murmurs & Other Heart Sounds:Auscultation of the heart reveals - No  Murmurs.   Abdomen Inspection:Contour- Generalized moderate distention. Palpation/Percussion:Tenderness- Abdomen is non-tender to palpation. Rigidity (guarding)- Abdomen is soft. Auscultation:Auscultation of the abdomen reveals - Bowel sounds normal.   Male Genitourinary Not done, not pertinent to present illness  Musculoskeletal Alert and oriented in no apparent distress. Both hips have normal range of motion with no discomfort. Left knee no effusion. Range is 0-125. Minimal crepitus on range of motion. He has no joint line tenderness or instability. Right knee no effusion. There is some soft tissue thickening about the knee. Range is about 5-115. Marked crepitus on range of motion with tenderness medial greater than lateral and no instability noted. He has 1+ peripheral edema both legs. There is no evidence of any stasis changes.  RADIOGRAPHS: AP both knees and lateral show advanced end stage arthritis of the right knee. He is bone on bone medial and significant narrowing laterally and bone on bone patellofemoral. The left knee has some changes but no where near as bad.  Assessment & Plan Osteoarthritis, Knee (715.96) Impression: Right Knee  Note: Plan is for a Right Total Knee Replacement by Dr. Aluisio.  Plan is to go home.   Signed electronically by DREW L Katti Pelle, PA-C  

## 2012-06-19 NOTE — Transfer of Care (Signed)
Immediate Anesthesia Transfer of Care Note  Patient: Brent Dougherty  Procedure(s) Performed: Procedure(s): RIGHT TOTAL KNEE ARTHROPLASTY (Right)  Patient Location: PACU  Anesthesia Type:Spinal  Level of Consciousness: awake, alert , oriented and patient cooperative  Airway & Oxygen Therapy: Patient Spontanous Breathing and Patient connected to face mask oxygen  Post-op Assessment: Report given to PACU RN and Post -op Vital signs reviewed and stable  Post vital signs: stable  Complications: No apparent anesthesia complications

## 2012-06-19 NOTE — Anesthesia Preprocedure Evaluation (Addendum)
Anesthesia Evaluation  Patient identified by MRN, date of birth, ID band Patient awake    Reviewed: Allergy & Precautions, H&P , NPO status , Patient's Chart, lab work & pertinent test results  History of Anesthesia Complications (+) PONV  Airway Mallampati: II TM Distance: >3 FB Neck ROM: Full    Dental no notable dental hx.    Pulmonary neg pulmonary ROS,  breath sounds clear to auscultation  Pulmonary exam normal       Cardiovascular negative cardio ROS  Rhythm:Regular Rate:Normal     Neuro/Psych negative neurological ROS  negative psych ROS   GI/Hepatic negative GI ROS, Neg liver ROS,   Endo/Other  negative endocrine ROS  Renal/GU negative Renal ROS  negative genitourinary   Musculoskeletal negative musculoskeletal ROS (+)   Abdominal   Peds negative pediatric ROS (+)  Hematology negative hematology ROS (+)   Anesthesia Other Findings   Reproductive/Obstetrics negative OB ROS                           Anesthesia Physical Anesthesia Plan  ASA: II  Anesthesia Plan: Spinal   Post-op Pain Management:    Induction:   Airway Management Planned: Simple Face Mask  Additional Equipment:   Intra-op Plan:   Post-operative Plan:   Informed Consent: I have reviewed the patients History and Physical, chart, labs and discussed the procedure including the risks, benefits and alternatives for the proposed anesthesia with the patient or authorized representative who has indicated his/her understanding and acceptance.   Dental advisory given  Plan Discussed with: CRNA  Anesthesia Plan Comments:         Anesthesia Quick Evaluation

## 2012-06-19 NOTE — Anesthesia Postprocedure Evaluation (Deleted)
  Anesthesia Post-op Note  Patient: Brent Dougherty  Procedure(s) Performed: Procedure(s) (LRB): RIGHT TOTAL KNEE ARTHROPLASTY (Right)  Patient Location: PACU  Anesthesia Type: General  Level of Consciousness: awake and alert   Airway and Oxygen Therapy: Patient Spontanous Breathing  Post-op Pain: mild  Post-op Assessment: Post-op Vital signs reviewed, Patient's Cardiovascular Status Stable, Respiratory Function Stable, Patent Airway and No signs of Nausea or vomiting  Last Vitals:  Filed Vitals:   06/19/12 1130  BP: 146/76  Pulse: 63  Temp: 36.7 C  Resp: 15    Post-op Vital Signs: stable   Complications: No apparent anesthesia complications

## 2012-06-19 NOTE — Anesthesia Procedure Notes (Signed)
Spinal  Patient location during procedure: OR End time: 06/19/2012 7:09 AM Staffing CRNA/Resident: Annalysa Mohammad E Performed by: anesthesiologist and resident/CRNA  Preanesthetic Checklist Completed: patient identified, site marked, surgical consent, pre-op evaluation, timeout performed, IV checked, risks and benefits discussed and monitors and equipment checked Spinal Block Patient position: sitting Prep: Betadine Patient monitoring: heart rate, continuous pulse ox and blood pressure Approach: midline Location: L3-4 Injection technique: single-shot Needle Needle type: Spinocan  Needle gauge: 22 G Needle length: 9 cm Assessment Sensory level: T6 Additional Notes Expiration date of kit checked and confirmed. Patient tolerated procedure well, without complications.

## 2012-06-19 NOTE — Evaluation (Signed)
Physical Therapy Evaluation Patient Details Name: Brent Dougherty MRN: [id number] DOB: [birth year] Today's Date: 06/19/2012 Time: 1530-1600 PT Time Calculation (min): 30 min  PT Assessment / Plan / Recommendation Clinical Impression  62 yo male admitted 06/19/12 for RTKA. Pt. able to ambulate today x 140 '. Pt will benefit from PT while in acute care. Pt. plans DC to home with caregivers.    PT Assessment  Patient needs continued PT services    Follow Up Recommendations  Home health PT    Does the patient have the potential to tolerate intense rehabilitation      Barriers to Discharge        Equipment Recommendations  Rolling walker with 5" wheels;Other (comment) (crutches)    Recommendations for Other Services     Frequency 7X/week    Precautions / Restrictions Precautions Precautions: Knee Required Braces or Orthoses: Knee Immobilizer - Right Knee Immobilizer - Right: Discontinue once straight leg raise with < 10 degree lag   Pertinent Vitals/Pain Pain 2 R knee.      Mobility  Bed Mobility Bed Mobility: Supine to Sit;Sitting - Scoot to Edge of Bed Supine to Sit: 4: Min assist;With rails;HOB elevated Sitting - Scoot to [place] of Bed: With rail;5: Supervision Transfers Transfers: Sit to Stand;Stand to Sit Sit to Stand: 4: Min assist;From bed;With upper extremity assist;From elevated surface Stand to Sit: To chair/3-in-1;With armrests;With upper extremity assist;4: Min assist Details for Transfer Assistance: cues for RLE position, hand placement. Ambulation/Gait Ambulation/Gait Assistance: 4: Min assist Ambulation Distance (Feet): 140 Feet Assistive device: Rolling walker Ambulation/Gait Assistance Details: cues to slow pace, pt walks qickly Gait Pattern: Step-through pattern    Exercises     PT Diagnosis: Difficulty walking  PT Problem List: Decreased strength;Decreased range of motion;Decreased activity tolerance;Decreased mobility;Decreased knowledge of  precautions;Decreased safety awareness;Decreased knowledge of use of DME PT Treatment Interventions: DME instruction;Gait training;Stair training;Functional mobility training;Therapeutic activities;Therapeutic exercise;Patient/family education   PT Goals Acute Rehab PT Goals PT Goal Formulation: With patient/family Time For Goal Achievement: 06/26/12 Potential to Achieve Goals: Good Pt will go Supine/Side to Sit: with supervision PT Goal: Supine/Side to Sit - Progress: Goal set today Pt will go Sit to Supine/Side: with supervision PT Goal: Sit to Supine/Side - Progress: Goal set today Pt will go Sit to Stand: with supervision PT Goal: Sit to Stand - Progress: Goal set today Pt will go Stand to Sit: with supervision PT Goal: Stand to Sit - Progress: Goal set today Pt will Ambulate: >150 feet;with supervision;with least restrictive assistive device PT Goal: Ambulate - Progress: Goal set today Pt will Go Up / Down Stairs: 3-5 stairs;with min assist;with least restrictive assistive device PT Goal: Up/Down Stairs - Progress: Goal set today Pt will Perform Home Exercise Program: with supervision, verbal cues required/provided PT Goal: Perform Home Exercise Program - Progress: Goal set today  Visit Information  Last PT Received On: 06/19/12 Assistance Needed: +1    Subjective Data  Subjective: I am doing better 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   Prior Functioning  Home Living Lives With: Spouse Available Help at Discharge: Family Type of Home: House Home Access: Stairs to enter Secretary/administrator of Steps: 4 Entrance Stairs-Rails: Right;Left Home Layout: One level Bathroom Shower/Tub: Engineer, manufacturing systems: Standard Home Adaptive Equipment: Straight cane Prior Function Level of Independence: Independent Able to Take Stairs?: Yes Driving: Yes Vocation: Full time employment Communication Communication: No difficulties    Cognition   Cognition Arousal/Alertness: Awake/alert  Behavior During Therapy: WFL for tasks assessed/performed Overall Cognitive Status: Within Functional Limits for tasks assessed    Extremity/Trunk Assessment Right Lower Extremity Assessment RLE ROM/Strength/Tone: Deficits RLE ROM/Strength/Tone Deficits: able to perform SLR   Balance    End of Session PT - End of Session Equipment Utilized During Treatment: Right knee immobilizer Activity Tolerance: Patient tolerated treatment well Patient left: in chair;with call bell/phone within reach Nurse Communication: Mobility status  GP     Rada Hay 06/19/2012, 4:40 PM  Blanchard Kelch PT [phone]

## 2012-06-19 NOTE — Anesthesia Postprocedure Evaluation (Signed)
  Anesthesia Post-op Note  Patient: Brent Dougherty  Procedure(s) Performed: Procedure(s) (LRB): RIGHT TOTAL KNEE ARTHROPLASTY (Right)  Patient Location: PACU  Anesthesia Type: Spinal  Level of Consciousness: awake and alert   Airway and Oxygen Therapy: Patient Spontanous Breathing  Post-op Pain: mild  Post-op Assessment: Post-op Vital signs reviewed, Patient's Cardiovascular Status Stable, Respiratory Function Stable, Patent Airway and No signs of Nausea or vomiting  Last Vitals:  Filed Vitals:   06/19/12 1130  BP: 146/76  Pulse: 63  Temp: 36.7 C  Resp: 15    Post-op Vital Signs: stable   Complications: No apparent anesthesia complications

## 2012-06-19 NOTE — Plan of Care (Signed)
Problem: Consults Goal: Diagnosis- Total Joint Replacement Primary Total Knee     

## 2012-06-20 ENCOUNTER — Encounter (HOSPITAL_COMMUNITY): Payer: Self-pay | Admitting: Orthopedic Surgery

## 2012-06-20 LAB — CBC
HCT: 35.8 % — ABNORMAL LOW (ref 39.0–52.0)
Hemoglobin: 11.7 g/dL — ABNORMAL LOW (ref 13.0–17.0)
MCV: 88.6 fL (ref 78.0–100.0)
RBC: 4.04 MIL/uL — ABNORMAL LOW (ref 4.22–5.81)
RDW: 12.7 % (ref 11.5–15.5)
WBC: 8.3 10*3/uL (ref 4.0–10.5)

## 2012-06-20 LAB — BASIC METABOLIC PANEL
BUN: 9 mg/dL (ref 6–23)
CO2: 29 mEq/L (ref 19–32)
Chloride: 105 mEq/L (ref 96–112)
Creatinine, Ser: 0.84 mg/dL (ref 0.50–1.35)
Glucose, Bld: 122 mg/dL — ABNORMAL HIGH (ref 70–99)
Potassium: 3.8 mEq/L (ref 3.5–5.1)

## 2012-06-20 NOTE — Progress Notes (Signed)
Received orders for rw and commode.  Will be delivered to patients room prior to d/c.

## 2012-06-20 NOTE — Evaluation (Signed)
Occupational Therapy Evaluation Patient Details Name: DIAMONTE STAVELY MRN: [id number] DOB: 08/07/1950 Today's Date: 06/20/2012 Time: 2130-8657 OT Time Calculation (min): 17 min  OT Assessment / Plan / Recommendation Clinical Impression  Pt presents to OT s/p TKR. All education regarding ADL activity complete. Will sign off    OT Assessment  Patient does not need any further OT services    Follow Up Recommendations  No OT follow up       Equipment Recommendations  Toilet riser          Precautions / Restrictions Precautions Precautions: Knee Required Braces or Orthoses: Knee Immobilizer - Right Knee Immobilizer - Right: Discontinue once straight leg raise with < 10 degree lag       ADL  Lower Body Dressing: Simulated;Minimal assistance;Other (comment) (wife will assist as needed) Where Assessed - Lower Body Dressing: Unsupported sit to stand Toilet Transfer: Simulated;Supervision/safety Toilet Transfer Method: Sit to stand Toilet Transfer Equipment: Comfort height toilet Toileting - Clothing Manipulation and Hygiene: Performed;Supervision/safety Where Assessed - Toileting Clothing Manipulation and Hygiene: Standing Transfers/Ambulation Related to ADLs: Pt will benefit from an elevated toilet seat. Wife will A with ADL activity as needed. instructed on safety with ADL activity ADL Comments: Pt does not feel he needs any further OT          Visit Information  Last OT Received On: 06/20/12 Assistance Needed: +1    Subjective Data  Subjective: I will need something to elevate my toilet   Prior Functioning     Home Living Lives With: Spouse Available Help at Discharge: Family Type of Home: House Home Access: Stairs to enter Secretary/administrator of Steps: 4 Entrance Stairs-Rails: Right;Left Home Layout: One level Bathroom Shower/Tub: Engineer, manufacturing systems: Standard Home Adaptive Equipment: Straight cane Prior Function Level of Independence:  Independent Able to Take Stairs?: Yes Driving: Yes Vocation: Full time employment Communication Communication: No difficulties         Vision/Perception Vision - History Patient Visual Report: No change from baseline   Cognition  Cognition Arousal/Alertness: Awake/alert Behavior During Therapy: WFL for tasks assessed/performed Overall Cognitive Status: Within Functional Limits for tasks assessed    Extremity/Trunk Assessment Right Upper Extremity Assessment RUE ROM/Strength/Tone: Within functional levels Left Upper Extremity Assessment LUE ROM/Strength/Tone: Within functional levels     Mobility Bed Mobility Supine to Sit: 4: Min assist;HOB elevated;With rails Sitting - Scoot to Edge of Bed: 5: Supervision;With rail Details for Bed Mobility Assistance: encouraged to try not to use the bar but pt does have difficulty coming up to sitting. will practice with bed flat next visit. Transfers Transfers: Sit to Stand;Stand to Sit Sit to Stand: 5: Supervision;With upper extremity assist;From chair/3-in-1 Stand to Sit: 5: Supervision;With upper extremity assist;To chair/3-in-1 Details for Transfer Assistance: cues for RLE position, hand placement.     Exercise Total Joint Exercises Quad Sets: AROM;Both;10 reps;Supine Heel Slides: AAROM;Right;10 reps;Supine Straight Leg Raises: AAROM;Right;10 reps;Supine Goniometric ROM: 10-50 knee ROM      End of Session OT - End of Session Equipment Utilized During Treatment: Right knee immobilizer Activity Tolerance: Patient tolerated treatment well Patient left: in chair;with call bell/phone within reach  GO     Sophia Sperry, [place] Kung 06/20/2012, 2:20 PM

## 2012-06-20 NOTE — Progress Notes (Signed)
   Subjective: 1 Day Post-Op Procedure(s) (LRB): RIGHT TOTAL KNEE ARTHROPLASTY (Right) Patient reports pain as mild.   Patient seen in rounds with Dr. Lequita Halt. Patient is well, and has had no acute complaints or problems We will start therapy today.  Plan is to go Home after hospital stay.  Objective: Vital signs in last 24 hours: Temp:  [96.5 F (35.8 C)-99 F (37.2 C)] 98 F (36.7 C) (05/06 0637) Pulse Rate:  [48-77] 64 (05/06 0637) Resp:  [14-21] 16 (05/06 0637) BP: (111-146)/(64-82) 118/64 mmHg (05/06 0637) SpO2:  [96 %-100 %] 99 % (05/06 0637) Weight:  [148.326 kg (327 lb)] 148.326 kg (327 lb) (05/05 1030)  Intake/Output from previous day:  Intake/Output Summary (Last 24 hours) at 06/20/12 0820 Last data filed at 06/20/12 0555  Gross per 24 hour  Intake   3920 ml  Output   3020 ml  Net    900 ml    Intake/Output this shift: UOP 500 since MN +1900  Labs:  Recent Labs  06/20/12 0452  HGB 11.7*    Recent Labs  06/20/12 0452  WBC 8.3  RBC 4.04*  HCT 35.8*  PLT 204    Recent Labs  06/20/12 0452  NA 139  K 3.8  CL 105  CO2 29  BUN 9  CREATININE 0.84  GLUCOSE 122*  CALCIUM 8.3*   No results found for this basename: LABPT, INR,  in the last 72 hours  EXAM General - Patient is Alert, Appropriate and Oriented Extremity - Neurovascular intact Sensation intact distally Dorsiflexion/Plantar flexion intact Dressing - dressing C/D/I Motor Function - intact, moving foot and toes well on exam.  Hemovac pulled without difficulty.  Past Medical History  Diagnosis Date  . History of kidney stones   . Bronchitis     LAST TIME WAS JAN 2014  . Arthritis     BOTH KNEES  . PONV (postoperative nausea and vomiting)     Assessment/Plan: 1 Day Post-Op Procedure(s) (LRB): RIGHT TOTAL KNEE ARTHROPLASTY (Right) Principal Problem:   OA (osteoarthritis) of knee  Estimated body mass index is 44.34 kg/(m^2) as calculated from the following:   Height as of  this encounter: 6' (1.829 m).   Weight as of this encounter: 148.326 kg (327 lb). Advance diet Up with therapy Plan for discharge tomorrow Discharge home with home health  DVT Prophylaxis - Xarelto Weight-Bearing as tolerated to right leg No vaccines. D/C O2 and Pulse OX and try on Room Air  Rieley Hausman 06/20/2012, 8:20 AM

## 2012-06-20 NOTE — Progress Notes (Signed)
Physical Therapy Treatment Patient Details Name: Brent Dougherty MRN: [id number] DOB: [birth year] Today's Date: 06/20/2012 Time: 1000-1026 PT Time Calculation (min): 26 min  PT Assessment / Plan / Recommendation Comments on Treatment Session  Pt not able to perform I SLR today. tends to keep R leg in external rotation with knee flexed. Instructed pt/wife on how to prop R leg to facilitate extension. continue PT. will need to practice steps before DC tomorrow.    Follow Up Recommendations  Home health PT     Does the patient have the potential to tolerate intense rehabilitation     Barriers to Discharge        Equipment Recommendations  Rolling walker with 5" wheels;Other (comment)    Recommendations for Other Services    Frequency 7X/week   Plan Discharge plan remains appropriate;Frequency remains appropriate    Precautions / Restrictions Precautions Precautions: Knee Required Braces or Orthoses: Knee Immobilizer - Right Knee Immobilizer - Right: Discontinue once straight leg raise with < 10 degree lag   Pertinent Vitals/Pain 2 at rest, increased with weight.    Mobility  Bed Mobility Supine to Sit: 4: Min assist;HOB elevated;With rails Sitting - Scoot to Edge of Bed: 5: Supervision;With rail Details for Bed Mobility Assistance: encouraged to try not to use the bar but pt does have difficulty coming up to sitting. will practice with bed flat next visit. Transfers Sit to Stand: 4: Min assist;From bed;With upper extremity assist;From elevated surface Stand to Sit: To chair/3-in-1;With armrests;With upper extremity assist;4: Min assist Details for Transfer Assistance: cues for RLE position, hand placement. Ambulation/Gait Ambulation/Gait Assistance: 4: Min guard Ambulation Distance (Feet): 140 Feet Assistive device: Rolling walker Ambulation/Gait Assistance Details: removed KI after 70 ft, decreased weight tolerated, knee flexed in stance. Gait Pattern: Step-through  pattern;Antalgic General Gait Details: encouraged to slow speed.    Exercises Total Joint Exercises Quad Sets: AROM;Both;10 reps;Supine Heel Slides: AAROM;Right;10 reps;Supine Straight Leg Raises: AAROM;Right;10 reps;Supine Goniometric ROM: 10-50 knee ROM   PT Diagnosis:    PT Problem List:   PT Treatment Interventions:     PT Goals Acute Rehab PT Goals Pt will go Supine/Side to Sit: with supervision PT Goal: Supine/Side to Sit - Progress: Progressing toward goal Pt will go Sit to Stand: with supervision PT Goal: Sit to Stand - Progress: Progressing toward goal Pt will go Stand to Sit: with supervision PT Goal: Stand to Sit - Progress: Progressing toward goal Pt will Ambulate: >150 feet;with supervision;with least restrictive assistive device PT Goal: Ambulate - Progress: Progressing toward goal Pt will Perform Home Exercise Program: with supervision, verbal cues required/provided PT Goal: Perform Home Exercise Program - Progress: Progressing toward goal  Visit Information  Last PT Received On: 06/20/12 Assistance Needed: +1    Subjective Data  Subjective: I am ready to go.   Cognition  Cognition Arousal/Alertness: Awake/alert    Balance     End of Session PT - End of Session Equipment Utilized During Treatment: Right knee immobilizer Activity Tolerance: Patient tolerated treatment well Patient left: in chair;with call bell/phone within reach   GP     Rada Hay 06/20/2012, 11:38 AM

## 2012-06-20 NOTE — Progress Notes (Signed)
Utilization review completed.  

## 2012-06-20 NOTE — Progress Notes (Signed)
Physical Therapy Treatment Patient Details Name: Brent Dougherty MRN: [id number] DOB: 02-Oct-1950 Today's Date: 06/20/2012 Time: 5621-3086 PT Time Calculation (min): 23 min  PT Assessment / Plan / Recommendation Comments on Treatment Session  Pt is improving in weight oon R leg , knee continues to be flexed. Plans DC tomorrow. needs to practice steps.    Follow Up Recommendations  Home health PT     Does the patient have the potential to tolerate intense rehabilitation     Barriers to Discharge        Equipment Recommendations  Rolling walker with 5" wheels;Other (comment)    Recommendations for Other Services    Frequency 7X/week   Plan Discharge plan remains appropriate;Frequency remains appropriate    Precautions / Restrictions Precautions Precautions: Knee Required Braces or Orthoses: Knee Immobilizer - Right Knee Immobilizer - Right: Discontinue once straight leg raise with < 10 degree lag   Pertinent Vitals/Pain     Mobility  Bed Mobility Bed Mobility: Not assessed Transfers Sit to Stand: 5: Supervision;With upper extremity assist;From chair/3-in-1 Stand to Sit: 5: Supervision;With upper extremity assist;To chair/3-in-1 Details for Transfer Assistance: cues for RLE position, hand placement. Ambulation/Gait Ambulation/Gait Assistance: 4: Min guard Ambulation Distance (Feet): 140 Feet Assistive device: Rolling walker Ambulation/Gait Assistance Details: ambulated with no KI , encouraged heel strike, decr step length R, and step past with L Gait Pattern: Step-through pattern;Decreased stance time - right;Antalgic Gait velocity: improved speed--slower.    Exercises Total Joint Exercises Quad Sets: AROM;Both;10 reps;Supine Heel Slides: AAROM;Right;10 reps;Supine Straight Leg Raises: AAROM;Right;10 reps;Supine Long Arc Quad: AAROM;Right;10 reps;Seated Knee Flexion: AAROM;Right;20 reps;Seated Goniometric ROM: 10-50 knee ROM   PT Diagnosis:    PT Problem List:   PT  Treatment Interventions:     PT Goals Acute Rehab PT Goals Pt will go Supine/Side to Sit: with supervision PT Goal: Supine/Side to Sit - Progress: Progressing toward goal Pt will go Sit to Stand: with supervision PT Goal: Sit to Stand - Progress: Progressing toward goal Pt will go Stand to Sit: with supervision PT Goal: Stand to Sit - Progress: Progressing toward goal Pt will Ambulate: >150 feet;with supervision;with least restrictive assistive device PT Goal: Ambulate - Progress: Progressing toward goal Pt will Perform Home Exercise Program: with supervision, verbal cues required/provided PT Goal: Perform Home Exercise Program - Progress: Progressing toward goal  Visit Information  Last PT Received On: 06/20/12 Assistance Needed: +1    Subjective Data  Subjective: I feel it is better. How should I walk?   Cognition  Cognition Arousal/Alertness: Awake/alert Behavior During Therapy: WFL for tasks assessed/performed Overall Cognitive Status: Within Functional Limits for tasks assessed    Balance     End of Session PT - End of Session Equipment Utilized During Treatment: Right knee immobilizer Activity Tolerance: Patient tolerated treatment well Patient left: in chair;with call bell/phone within reach   GP     Rada Hay 06/20/2012, 3:31 PM

## 2012-06-21 LAB — BASIC METABOLIC PANEL
BUN: 8 mg/dL (ref 6–23)
Creatinine, Ser: 0.75 mg/dL (ref 0.50–1.35)
GFR calc Af Amer: >90 mL/min (ref >90)
GFR calc non Af Amer: >90 mL/min (ref >90)
Glucose, Bld: 114 mg/dL — ABNORMAL HIGH (ref 70–99)

## 2012-06-21 LAB — CBC
HCT: 37 % — ABNORMAL LOW (ref 39.0–52.0)
Hemoglobin: 12.2 g/dL — ABNORMAL LOW (ref 13.0–17.0)
MCHC: 33 g/dL (ref 30.0–36.0)
MCV: 87.1 fL (ref 78.0–100.0)
RDW: 12.4 % (ref 11.5–15.5)

## 2012-06-21 MED ORDER — TRAMADOL HCL 50 MG PO TABS: 50.0000 mg | ORAL_TABLET | Freq: Four times a day (QID) | ORAL | Status: DC | PRN

## 2012-06-21 MED ORDER — METHOCARBAMOL 500 MG PO TABS: 500.0000 mg | ORAL_TABLET | Freq: Four times a day (QID) | ORAL | Status: DC | PRN

## 2012-06-21 MED ORDER — RIVAROXABAN 10 MG PO TABS: 10.0000 mg | ORAL_TABLET | Freq: Every day | ORAL | Status: DC

## 2012-06-21 MED ORDER — OXYCODONE HCL 5 MG PO TABS: 5.0000 mg | ORAL_TABLET | ORAL | Status: DC | PRN

## 2012-06-21 NOTE — Progress Notes (Signed)
Discharge summary sent to payer through [facility]  

## 2012-06-21 NOTE — Progress Notes (Signed)
   Subjective: 2 Days Post-Op Procedure(s) (LRB): RIGHT TOTAL KNEE ARTHROPLASTY (Right) Patient reports pain as mild.   Patient seen in rounds with Dr. Lequita Halt.  Wife in room. Patient is well, and has had no acute complaints or problems Patient is ready to go home later today.  Objective: Vital signs in last 24 hours: Temp:  [98.3 F (36.8 C)-99.6 F (37.6 C)] 98.3 F (36.8 C) (05/07 0601) Pulse Rate:  [65-81] 81 (05/07 0601) Resp:  [16-18] 16 (05/07 0601) BP: (126-143)/(66-78) 142/76 mmHg (05/07 0601) SpO2:  [93 %-100 %] 94 % (05/07 0601)  Intake/Output from previous day:  Intake/Output Summary (Last 24 hours) at 06/21/12 0733 Last data filed at 06/21/12 0601  Gross per 24 hour  Intake 1408.33 ml  Output   4410 ml  Net -3001.67 ml    Intake/Output this shift:    Labs:  Recent Labs  06/20/12 0452 06/21/12 0455  HGB 11.7* 12.2*    Recent Labs  06/20/12 0452 06/21/12 0455  WBC 8.3 10.8*  RBC 4.04* 4.25  HCT 35.8* 37.0*  PLT 204 231    Recent Labs  06/20/12 0452 06/21/12 0455  NA 139 138  K 3.8 4.7  CL 105 103  CO2 29 31  BUN 9 8  CREATININE 0.84 0.75  GLUCOSE 122* 114*  CALCIUM 8.3* 9.2   No results found for this basename: LABPT, INR,  in the last 72 hours  EXAM: General - Patient is Alert, Appropriate and Oriented Extremity - Neurovascular intact Sensation intact distally Dorsiflexion/Plantar flexion intact No cellulitis present Incision - clean, dry, no drainage, healing Motor Function - intact, moving foot and toes well on exam.   Assessment/Plan: 2 Days Post-Op Procedure(s) (LRB): RIGHT TOTAL KNEE ARTHROPLASTY (Right) Procedure(s) (LRB): RIGHT TOTAL KNEE ARTHROPLASTY (Right) Past Medical History  Diagnosis Date  . History of kidney stones   . Bronchitis     LAST TIME WAS JAN 2014  . Arthritis     BOTH KNEES  . PONV (postoperative nausea and vomiting)    Principal Problem:   OA (osteoarthritis) of knee  Estimated body mass  index is 44.34 kg/(m^2) as calculated from the following:   Height as of this encounter: 6' (1.829 m).   Weight as of this encounter: 148.326 kg (327 lb). Up with therapy Discharge home with home health Diet - Regular diet Follow up - in 2 weeks Activity - WBAT Disposition - Home Condition Upon Discharge - Good D/C Meds - See DC Summary DVT Prophylaxis - Xarelto  PERKINS, ALEXZANDREW 06/21/2012, 7:33 AM

## 2012-06-21 NOTE — Progress Notes (Signed)
Physical Therapy Treatment Patient Details Name: Brent Dougherty MRN: [id number] DOB: June 11, 1950 Today's Date: 06/21/2012 Time: 4098-1191 PT Time Calculation (min): 48 min  PT Assessment / Plan / Recommendation Comments on Treatment Session  POD # 2 R TKR pt plans to D/C to home today with spouse.  Amb in hallway, instructed on KI benefits, practiced stairs then performed HEP.  Pt given a handout HEP.  Pt issued on crutch for stairs. All therapy questions addressed.    Follow Up Recommendations  Home health PT     Does the patient have the potential to tolerate intense rehabilitation     Barriers to Discharge        Equipment Recommendations  Rolling walker with 5" wheels;Other (comment) (beriatric 3:1)    Recommendations for Other Services    Frequency 7X/week   Plan Discharge plan remains appropriate;Frequency remains appropriate    Precautions / Restrictions Precautions Precautions: Knee Precaution Comments: Instructed pt and spouse on KI use for amb and stairs plus proper application Required Braces or Orthoses: Knee Immobilizer - Right Knee Immobilizer - Right: Discontinue once straight leg raise with < 10 degree lag Restrictions Weight Bearing Restrictions: No Other Position/Activity Restrictions: WBAT   Pertinent Vitals/Pain C/o 6/10 R knee pain during TE's meds requested ICE applied    Mobility  Bed Mobility Bed Mobility: Sit to Supine Sit to Supine: 4: Min guard Details for Bed Mobility Assistance: MinGuard assist to support R LE on bed Transfers Transfers: Sit to Stand;Stand to Sit Sit to Stand: 5: Supervision;With upper extremity assist;From chair/3-in-1 Stand to Sit: 5: Supervision;With upper extremity assist;To chair/3-in-1 Details for Transfer Assistance: cues for RLE position, hand placement.to increase safety. Ambulation/Gait Ambulation/Gait Assistance: 4: Min guard Ambulation Distance (Feet): 20 Feet Assistive device: Rolling walker Ambulation/Gait  Assistance Details: 75% VC's to decrease gait speed and increase WB thru R LE. Instructed on benefits of equal  weightshift and safety of slower gait speed.  Gait Pattern: Step-through pattern;Decreased stance time - right;Antalgic Gait velocity: improved speed--slower. General Gait Details: encouraged to slow speed. Stairs: Yes Stairs Assistance: 5: Supervision Stairs Assistance Details (indicate cue type and reason): with spouse and VC's on safety esp to slow down and caution to foot placement on step Stair Management Technique: One rail Right;With crutches;Forwards Number of Stairs: 4    Exercises   Total Knee Replacement TE's 10 reps B LE ankle pumps 10 reps knee presses 10 reps heel slides  10 reps SAQ's 10 reps SLR's 10 reps ABD Followed by ICE    PT Goals                                                    progressing    Visit Information  Last PT Received On: 06/21/12 Assistance Needed: +1    Subjective Data      Cognition    good   Balance   fair  End of Session PT - End of Session Equipment Utilized During Treatment: Right knee immobilizer;Gait belt Activity Tolerance: Patient tolerated treatment well Patient left: in bed;with call bell/phone within reach Nurse Communication: Mobility status   Felecia Shelling  PTA [facility]  Acute  Rehab Pager      [phone]

## 2012-06-21 NOTE — Care Management Note (Signed)
    Page 1 of 2   06/21/2012     5:03:29 PM   CARE MANAGEMENT NOTE 06/21/2012  Patient:  ZYLAN, ALMQUIST   Account Number:  [id number]  Date Initiated:  06/21/2012  Documentation initiated by:  Colleen Can  Subjective/Objective Assessment:   dx total knee replacemnt right     Action/Plan:   Cm spoke with patient. Plans are for patient to to return to his home in [place] ,[place] where spouse and daughter will be caregivers. Genevieve Norlander will provide [facility] services. RW has been delivered to pt's room and 3n1 will be delivered to home.   Anticipated DC Date:  06/21/2012   Anticipated DC Plan:  HOME W HOME HEALTH SERVICES      DC Planning Services  CM consult      [facility] Choice  HOME HEALTH  DURABLE MEDICAL EQUIPMENT   Choice offered to / List presented to:  C-3 Spouse   DME arranged  3-N-1  Levan Hurst      DME agency  [facility]     HH arranged  HH-2 PT      [facility] agency  [facility]   Status of service:  Completed, signed off Medicare Important Message given?   (If response is "NO", the following Medicare IM given date fields will be blank) Date Medicare IM given:   Date Additional Medicare IM given:    Discharge Disposition:  HOME W HOME HEALTH SERVICES  Per UR Regulation:    If discussed at Long Length of Stay Meetings, dates discussed:    Comments:  06/21/2012 Colleen Can BSN RN CCM [phone] Genevieve Norlander will start HHpt services tomorrow 06/22/2012.

## 2012-06-21 NOTE — Discharge Summary (Signed)
Physician Discharge Summary   Patient ID: Brent Dougherty MRN: [id number] DOB/AGE: [birth year] 62 y.o.  Admit date: 06/19/2012 Discharge date: 06/21/2012  Primary Diagnosis: Osteoarthritis Right knee   Admission Diagnoses:  Past Medical History  Diagnosis Date  . History of kidney stones   . Bronchitis     LAST TIME WAS JAN 2014  . Arthritis     BOTH KNEES  . PONV (postoperative nausea and vomiting)    Discharge Diagnoses:   Principal Problem:   OA (osteoarthritis) of knee  Estimated body mass index is 44.34 kg/(m^2) as calculated from the following:   Height as of this encounter: 6' (1.829 m).   Weight as of this encounter: 148.326 kg (327 lb).  Procedure:  Procedure(s) (LRB): RIGHT TOTAL KNEE ARTHROPLASTY (Right)   Consults: None  HPI: Brent Dougherty is a 62 y.o. year old male with end stage OA of his right knee with progressively worsening pain and dysfunction. He has constant pain, with activity and at rest and significant functional deficits with difficulties even with ADLs. He has had extensive non-op management including analgesics, injections of cortisone, and home exercise program, but remains in significant pain with significant dysfunction. Radiographs show bone on bone arthritis all 3 compartmentsHe presents now for right Total Knee Arthroplasty.   Laboratory Data: Admission on 06/19/2012  Component Date Value Range Status  . ABO/RH(D) 06/19/2012 A POS   Final  . Antibody Screen 06/19/2012 NEG   Final  . Sample Expiration 06/19/2012 06/22/2012   Final  . ABO/RH(D) 06/19/2012 A POS   Final  . WBC 06/20/2012 8.3  4.0 - 10.5 K/uL Final  . RBC 06/20/2012 4.04* 4.22 - 5.81 MIL/uL Final  . Hemoglobin 06/20/2012 11.7* 13.0 - 17.0 g/dL Final  . HCT 30/86/5784 35.8* 39.0 - 52.0 % Final  . MCV 06/20/2012 88.6  78.0 - 100.0 fL Final  . MCH 06/20/2012 29.0  26.0 - 34.0 pg Final  . MCHC 06/20/2012 32.7  30.0 - 36.0 g/dL Final  . RDW 69/62/9528 12.7  11.5 - 15.5 % Final    . Platelets 06/20/2012 204  150 - 400 K/uL Final  . Sodium 06/20/2012 139  135 - 145 mEq/L Final  . Potassium 06/20/2012 3.8  3.5 - 5.1 mEq/L Final  . Chloride 06/20/2012 105  96 - 112 mEq/L Final  . CO2 06/20/2012 29  19 - 32 mEq/L Final  . Glucose, Bld 06/20/2012 122* 70 - 99 mg/dL Final  . BUN 41/32/4401 9  6 - 23 mg/dL Final  . Creatinine, Ser 06/20/2012 0.84  0.50 - 1.35 mg/dL Final  . Calcium 02/72/5366 8.3* 8.4 - 10.5 mg/dL Final  . GFR calc non Af Amer 06/20/2012 >90  >90 mL/min Final  . GFR calc Af Amer 06/20/2012 >90  >90 mL/min Final   Comment:                                 The eGFR has been calculated                          using the CKD EPI equation.                          This calculation has not been  validated in all clinical                          situations.                          eGFR's persistently                          <90 mL/min signify                          possible Chronic Kidney Disease.  . WBC 06/21/2012 10.8* 4.0 - 10.5 K/uL Final  . RBC 06/21/2012 4.25  4.22 - 5.81 MIL/uL Final  . Hemoglobin 06/21/2012 12.2* 13.0 - 17.0 g/dL Final  . HCT 16/11/9602 37.0* 39.0 - 52.0 % Final  . MCV 06/21/2012 87.1  78.0 - 100.0 fL Final  . MCH 06/21/2012 28.7  26.0 - 34.0 pg Final  . MCHC 06/21/2012 33.0  30.0 - 36.0 g/dL Final  . RDW 54/10/8117 12.4  11.5 - 15.5 % Final  . Platelets 06/21/2012 231  150 - 400 K/uL Final  . Sodium 06/21/2012 138  135 - 145 mEq/L Final  . Potassium 06/21/2012 4.7  3.5 - 5.1 mEq/L Final   Comment: RESULT REPEATED AND VERIFIED                          DELTA CHECK NOTED  . Chloride 06/21/2012 103  96 - 112 mEq/L Final  . CO2 06/21/2012 31  19 - 32 mEq/L Final  . Glucose, Bld 06/21/2012 114* 70 - 99 mg/dL Final  . BUN 14/78/2956 8  6 - 23 mg/dL Final  . Creatinine, Ser 06/21/2012 0.75  0.50 - 1.35 mg/dL Final  . Calcium 21/30/8657 9.2  8.4 - 10.5 mg/dL Final  . GFR calc non Af Amer 06/21/2012 >90   >90 mL/min Final  . GFR calc Af Amer 06/21/2012 >90  >90 mL/min Final   Comment:                                 The eGFR has been calculated                          using the CKD EPI equation.                          This calculation has not been                          validated in all clinical                          situations.                          eGFR's persistently                          <90 mL/min signify                          possible Chronic Kidney Disease.  Hospital Outpatient Visit on 06/13/2012  Component Date Value Range Status  . MRSA, PCR 06/13/2012 NEGATIVE  NEGATIVE Final  . Staphylococcus aureus 06/13/2012 POSITIVE* NEGATIVE Final   Comment:                                 The Xpert SA Assay (FDA                          approved for NASAL specimens                          in patients over 68 years of age),                          is one component of                          a comprehensive surveillance                          program.  Test performance has                          been validated by [employer] for patients greater                          than or equal to 10 year old.                          It is not intended                          to diagnose infection nor to                          guide or monitor treatment.  Marland Kitchen aPTT 06/13/2012 33  24 - 37 seconds Final  . WBC 06/13/2012 6.2  4.0 - 10.5 K/uL Final  . RBC 06/13/2012 5.10  4.22 - 5.81 MIL/uL Final  . Hemoglobin 06/13/2012 15.2  13.0 - 17.0 g/dL Final  . HCT 04/54/0981 43.6  39.0 - 52.0 % Final  . MCV 06/13/2012 85.5  78.0 - 100.0 fL Final  . MCH 06/13/2012 29.8  26.0 - 34.0 pg Final  . MCHC 06/13/2012 34.9  30.0 - 36.0 g/dL Final  . RDW 19/14/7829 12.3  11.5 - 15.5 % Final  . Platelets 06/13/2012 306  150 - 400 K/uL Final  . Sodium 06/13/2012 140  135 - 145 mEq/L Final  . Potassium 06/13/2012 4.4  3.5 - 5.1 mEq/L Final  . Chloride 06/13/2012 104   96 - 112 mEq/L Final  . CO2 06/13/2012 29  19 - 32 mEq/L Final  . Glucose, Bld 06/13/2012 93  70 - 99 mg/dL Final  . BUN 56/21/3086 10  6 - 23 mg/dL Final  . Creatinine, Ser 06/13/2012 0.86  0.50 - 1.35 mg/dL Final  . Calcium 57/84/6962 9.7  8.4 - 10.5 mg/dL Final  . Total Protein 06/13/2012  7.0  6.0 - 8.3 g/dL Final  . Albumin 16/11/9602 3.8  3.5 - 5.2 g/dL Final  . AST 54/10/8117 18  0 - 37 U/L Final  . ALT 06/13/2012 18  0 - 53 U/L Final  . Alkaline Phosphatase 06/13/2012 56  39 - 117 U/L Final  . Total Bilirubin 06/13/2012 0.6  0.3 - 1.2 mg/dL Final  . GFR calc non Af Amer 06/13/2012 >90  >90 mL/min Final  . GFR calc Af Amer 06/13/2012 >90  >90 mL/min Final   Comment:                                 The eGFR has been calculated                          using the CKD EPI equation.                          This calculation has not been                          validated in all clinical                          situations.                          eGFR's persistently                          <90 mL/min signify                          possible Chronic Kidney Disease.  Marland Kitchen Prothrombin Time 06/13/2012 12.7  11.6 - 15.2 seconds Final  . INR 06/13/2012 0.96  0.00 - 1.49 Final  . Color, Urine 06/13/2012 YELLOW  YELLOW Final  . APPearance 06/13/2012 CLEAR  CLEAR Final  . Specific Gravity, Urine 06/13/2012 1.010  1.005 - 1.030 Final  . pH 06/13/2012 6.5  5.0 - 8.0 Final  . Glucose, UA 06/13/2012 NEGATIVE  NEGATIVE mg/dL Final  . Hgb urine dipstick 06/13/2012 NEGATIVE  NEGATIVE Final  . Bilirubin Urine 06/13/2012 NEGATIVE  NEGATIVE Final  . Ketones, ur 06/13/2012 NEGATIVE  NEGATIVE mg/dL Final  . Protein, ur 14/78/2956 NEGATIVE  NEGATIVE mg/dL Final  . Urobilinogen, UA 06/13/2012 0.2  0.0 - 1.0 mg/dL Final  . Nitrite 21/30/8657 NEGATIVE  NEGATIVE Final  . Leukocytes, UA 06/13/2012 NEGATIVE  NEGATIVE Final   MICROSCOPIC NOT DONE ON URINES WITH NEGATIVE PROTEIN, BLOOD, LEUKOCYTES, NITRITE,  OR GLUCOSE <1000 mg/dL.     X-Rays:Dg Chest 2 View  06/13/2012  *RADIOLOGY REPORT*  Clinical Data: Preop for right total knee arthroplasty.  Frequent bronchitis.  Hypertension.  CHEST - 2 VIEW  Comparison: None.  Findings: Hyperinflation.  Moderate thoracic spondylosis.  Patient rotated right on the frontal. Midline trachea.  Normal heart size and mediastinal contours. No pleural effusion or pneumothorax. Mild left hemidiaphragm elevation, with volume loss in the adjacent left lung base.  Right lung clear.  IMPRESSION: No acute cardiopulmonary disease.   Original Report Authenticated By: Jeronimo Greaves, M.D.     EKG: Orders placed during the hospital encounter of 06/13/12  . EKG 12-LEAD  . EKG  12-LEAD     Hospital Course: Brent Dougherty is a 62 y.o. who was admitted to [facility]. They were brought to the operating room on 06/19/2012 and underwent Procedure(s): RIGHT TOTAL KNEE ARTHROPLASTY.  Patient tolerated the procedure well and was later transferred to the recovery room and then to the orthopaedic floor for postoperative care.  They were given PO and IV analgesics for pain control following their surgery.  They were given 24 hours of postoperative antibiotics of  Anti-infectives   Start     Dose/Rate Route Frequency Ordered Stop   06/19/12 1400  ceFAZolin (ANCEF) IVPB 2 g/50 mL premix     2 g 100 mL/hr over 30 Minutes Intravenous Every 6 hours 06/19/12 0951 06/19/12 2002   06/19/12 0512  ceFAZolin (ANCEF) 3 g in dextrose 5 % 50 mL IVPB     3 g 160 mL/hr over 30 Minutes Intravenous On call to O.R. 06/19/12 1610 06/19/12 0703     and started on DVT prophylaxis in the form of Xarelto.   PT and OT were ordered for total joint protocol.  Discharge planning consulted to help with postop disposition and equipment needs.  Patient had a decent night on the evening of surgery.  They started to get up OOB with therapy on day one. Hemovac drain was pulled without difficulty.  Continued to work  with therapy into day two.  Dressing was changed on day two and the incision was healing well.  Patient was seen in rounds by Dr. Lequita Halt on day 2 and was ready to go home.   Discharge Medications: Prior to Admission medications   Medication Sig Start Date End Date Taking? Authorizing Provider  methocarbamol (ROBAXIN) 500 MG tablet Take 1 tablet (500 mg total) by mouth every 6 (six) hours as needed. 06/21/12   Milessa Hogan, PA-C  oxyCODONE (OXY IR/ROXICODONE) 5 MG immediate release tablet Take 1-2 tablets (5-10 mg total) by mouth every 3 (three) hours as needed. 06/21/12   Jhonny Calixto Julien Girt, PA-C  rivaroxaban (XARELTO) 10 MG TABS tablet Take 1 tablet (10 mg total) by mouth daily with breakfast. 06/21/12   Dequante Tremaine Julien Girt, PA-C  traMADol (ULTRAM) 50 MG tablet Take 1-2 tablets (50-100 mg total) by mouth every 6 (six) hours as needed (mild pain). 06/21/12   Mattheo Swindle Julien Girt, PA-C    Diet: Regular diet Activity:WBAT Follow-up:in 2 weeks Disposition - Home Discharged Condition: good   Discharge Orders   Future Orders Complete By Expires     Call MD / Call 911  As directed     Comments:      If you experience chest pain or shortness of breath, CALL 911 and be transported to the hospital emergency room.  If you develope a fever above 101 F, pus (white drainage) or increased drainage or redness at the wound, or calf pain, call your surgeon's office.    Change dressing  As directed     Comments:      Change dressing daily with sterile 4 x 4 inch gauze dressing and apply TED hose. Do not submerge the incision under water.    Constipation Prevention  As directed     Comments:      Drink plenty of fluids.  Prune juice may be helpful.  You may use a stool softener, such as Colace (over the counter) 100 mg twice a day.  Use MiraLax (over the counter) for constipation as needed.    Diet general  As directed  Discharge instructions  As directed     Comments:      Pick up stool softner and  laxative for home. Do not submerge incision under water. May shower. Continue to use ice for pain and swelling from surgery.  Take Xarelto for two and a half more weeks, then discontinue Xarelto.    Do not put a pillow under the knee. Place it under the heel.  As directed     Do not sit on low chairs, stoools or toilet seats, as it may be difficult to get up from low surfaces  As directed     Driving restrictions  As directed     Comments:      No driving until released by the physician.    Increase activity slowly as tolerated  As directed     Lifting restrictions  As directed     Comments:      No lifting until released by the physician.    Patient may shower  As directed     Comments:      You may shower without a dressing once there is no drainage.  Do not wash over the wound.  If drainage remains, do not shower until drainage stops.    TED hose  As directed     Comments:      Use stockings (TED hose) for 3 weeks on both leg(s).  You may remove them at night for sleeping.    Weight bearing as tolerated  As directed         Medication List    STOP taking these medications       ibuprofen 200 MG tablet  Commonly known as:  ADVIL,MOTRIN      TAKE these medications       methocarbamol 500 MG tablet  Commonly known as:  ROBAXIN  Take 1 tablet (500 mg total) by mouth every 6 (six) hours as needed.     oxyCODONE 5 MG immediate release tablet  Commonly known as:  Oxy IR/ROXICODONE  Take 1-2 tablets (5-10 mg total) by mouth every 3 (three) hours as needed.     rivaroxaban 10 MG Tabs tablet  Commonly known as:  XARELTO  Take 1 tablet (10 mg total) by mouth daily with breakfast.     traMADol 50 MG tablet  Commonly known as:  ULTRAM  Take 1-2 tablets (50-100 mg total) by mouth every 6 (six) hours as needed (mild pain).           Follow-up Information   Follow up with [name], MD. Schedule an appointment as soon as possible for a visit on 07/04/2012.   Contact  information:   [address], [address] [place] [address] [phone]       Signed: Patrica Duel 06/21/2012, 8:01 AM

## 2013-06-14 ENCOUNTER — Encounter (HOSPITAL_COMMUNITY): Payer: Self-pay | Admitting: Emergency Medicine

## 2013-06-14 ENCOUNTER — Emergency Department (HOSPITAL_COMMUNITY)
Admission: EM | Admit: 2013-06-14 | Discharge: 2013-06-14 | Disposition: A | Discharge status: Home / Self Care | Payer: BC Managed Care – PPO | Attending: Emergency Medicine | Admitting: Emergency Medicine

## 2013-06-14 DIAGNOSIS — Z96659 Presence of unspecified artificial knee joint: Secondary | ICD-10-CM | POA: Insufficient documentation

## 2013-06-14 DIAGNOSIS — S199XXA Unspecified injury of neck, initial encounter: Secondary | ICD-10-CM

## 2013-06-14 DIAGNOSIS — Y9389 Activity, other specified: Secondary | ICD-10-CM | POA: Insufficient documentation

## 2013-06-14 DIAGNOSIS — S0993XA Unspecified injury of face, initial encounter: Secondary | ICD-10-CM | POA: Insufficient documentation

## 2013-06-14 DIAGNOSIS — Z87442 Personal history of urinary calculi: Secondary | ICD-10-CM | POA: Insufficient documentation

## 2013-06-14 DIAGNOSIS — Z79899 Other long term (current) drug therapy: Secondary | ICD-10-CM | POA: Insufficient documentation

## 2013-06-14 DIAGNOSIS — R110 Nausea: Secondary | ICD-10-CM | POA: Insufficient documentation

## 2013-06-14 DIAGNOSIS — Z7901 Long term (current) use of anticoagulants: Secondary | ICD-10-CM | POA: Insufficient documentation

## 2013-06-14 DIAGNOSIS — IMO0002 Reserved for concepts with insufficient information to code with codable children: Secondary | ICD-10-CM | POA: Insufficient documentation

## 2013-06-14 DIAGNOSIS — Z8709 Personal history of other diseases of the respiratory system: Secondary | ICD-10-CM | POA: Insufficient documentation

## 2013-06-14 DIAGNOSIS — Y9289 Other specified places as the place of occurrence of the external cause: Secondary | ICD-10-CM | POA: Insufficient documentation

## 2013-06-14 DIAGNOSIS — M1710 Unilateral primary osteoarthritis, unspecified knee: Secondary | ICD-10-CM | POA: Insufficient documentation

## 2013-06-14 MED ORDER — NAPROXEN 500 MG PO TABS: 500.0000 mg | ORAL_TABLET | Freq: Two times a day (BID) | ORAL | Status: DC

## 2013-06-14 MED ORDER — HYDROCODONE-ACETAMINOPHEN 5-325 MG PO TABS: 1.0000 | ORAL_TABLET | ORAL | Status: DC | PRN
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
## 2013-06-15 NOTE — ED Provider Notes (Signed)
Medical screening examination/treatment/procedure(s) were performed by non-physician practitioner and as supervising physician I was immediately available for consultation/collaboration.  Chantea Surace T Tausha Milhoan, MD 06/15/13 2311 

## 2014-11-05 ENCOUNTER — Encounter: Payer: Self-pay | Admitting: Internal Medicine

## 2014-11-05 ENCOUNTER — Ambulatory Visit (INDEPENDENT_AMBULATORY_CARE_PROVIDER_SITE_OTHER): Payer: BLUE CROSS/BLUE SHIELD | Admitting: Internal Medicine

## 2014-11-05 VITALS — BP 144/80 | HR 102 | Ht 73.0 in | Wt 344.6 lb

## 2014-11-05 DIAGNOSIS — G4700 Insomnia, unspecified: Secondary | ICD-10-CM | POA: Diagnosis not present

## 2014-11-05 DIAGNOSIS — J4489 Other specified chronic obstructive pulmonary disease: Secondary | ICD-10-CM

## 2014-11-05 DIAGNOSIS — J449 Chronic obstructive pulmonary disease, unspecified: Secondary | ICD-10-CM

## 2014-11-05 DIAGNOSIS — E669 Obesity, unspecified: Secondary | ICD-10-CM

## 2014-11-05 DIAGNOSIS — G4733 Obstructive sleep apnea (adult) (pediatric): Secondary | ICD-10-CM | POA: Diagnosis not present

## 2014-11-05 NOTE — Assessment & Plan Note (Signed)
BMI 45.7. Weight loss is advised.

## 2014-11-05 NOTE — Assessment & Plan Note (Signed)
He complains of difficulty maintaining sleep times many years. Work concern was that his supervisor found in falling asleep at his desk and he feels his job is not secure. Physical exam strongly suggests risk for obstructive sleep apnea which may contribute to nighttime waking. We will arrange a sleep study. His wife doesn't notice his snoring but is admitted deep sleeper. At best he is only getting 6 hours of sleep he goes to bed at 9 and gets up at 3 AM so insufficient nighttime sleep may be part of the problem. Plan-sleep hygiene education, continue trazodone as prescribed by his physician for nighttime sleep, schedule sleep study, try to get at least 30 minutes more sleep per night. Letter written for him to show his employer that he is making an effort to seek professional help for his sleep issues.

## 2014-11-05 NOTE — Assessment & Plan Note (Addendum)
Symptoms are well controlled now. Never smoked.

## 2014-11-05 NOTE — Progress Notes (Signed)
:  03/14/08- Chief Complaint: Allergy Consult-Dr. Wilson-"extreme SOB with Rattles"-sleeps sitting up. Able to cough up green sputum yesterday; normally has a dry cough(usually happens mid afternoon and at night). Uses Nebulizer; has relief however pt doesnt have a rescue inhaler in hand. . History of Present Illness: 03/14/08- 57 yoM seen in allergy/pulmonary consult for Dr Andrey Campanile because of persistent wheeze and cough. Each year in Fall he catches colds, going back to 1990. There has usually been chest and head congestion. Strong odors and smoke tend to add irritation. He feels well once the episode clears. This year a similar acute illness is finally clearing after Avelox ending 1 week ago. Sputum is clearing from yellow. He was having to sleep sitting up. He has used a sample Advair and started a prednisone taper 2-3 weeks ago.he says usually a prednisone tapeer and depo shot have been sufficient before.  05/23/08- Dyspnea, bronchitis Hx of recurrent asthmatic bronchits each Fall with cold, persisting for long periods each winter. Got steadily better after initial visit in January. Finds now that he gets chest congestion quickly if he eats ice cream or a bagel. No problem with milk. Not bothered by pollen. Reviewed PFT- Mild reversible asthma /bronchitis pattern in small airways. He was off prednisone by that time and has remaned off. He ran out of Advair. Discussed pneumonia vaccine- never had it.  CONSULT 11/05/14- 64 yo M never smoker referred courtesy of Dr Benedetto Goad for sleep medicine evaluation; pt dozes off at any time-has been noticed at work.  Pt has hard time staying asleep-wakes up often and hard to get back to sleep (sometimes). Wife states pt snores and gasps for air; has never heard or seen patient stop breathing.  Wife here Previously seen at this office for asthmatic bronchitis LOV 2010  gets flu shot at work He complains of poor quality sleep. 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 He has had a chronic problem for years of repeated waking. Sleep onset is easy but he wakes 3 or 4 times each night without recognized discomfort. He may lie awake for hours. No sleep medicines until recently Dr. Andrey Campanile gave trazodone to try. No naps area daytime sleepiness if he sits quietly and he can fall asleep without realizing it. Denies problems driving. 4-5 cups of coffee before noon. ENT surgery-may be tonsils. Past history of bronchitis but no significant ongoing lung disease and no heart disease.  Prior to Admission medications   Medication Sig Start Date End Date Taking? Authorizing Provider  Ibuprofen (ADVIL) 200 MG CAPS Take by mouth.   Yes Historical Provider, MD  traZODone (DESYREL) 50 MG tablet Take 1-2 tablets at bedtime as needed for insomnia 11/04/14  Yes Historical Provider, MD   Past Medical History  Diagnosis Date  . History of kidney stones   . Bronchitis     LAST TIME WAS JAN 2014  . Arthritis     BOTH KNEES  . PONV (postoperative nausea and vomiting)    Past Surgical History  Procedure Laterality Date  . Bilateral knee arthroscopy    . 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 Total knee arthroplasty Right 06/19/2012    Procedure: RIGHT TOTAL KNEE ARTHROPLASTY;  Surgeon: [name], MD;  Location: [facility];  Service: Orthopedics;  Laterality: Right;   Family History  Problem Relation Age of Onset  . Heart disease Father   . Cancer Mother    Social History   Social History  . Marital Status: Married    Spouse Name: N/A  . Number of Children: N/A  . Years of Education: N/A   Occupational History  . Nature conservation officer    Social History Main Topics  . Smoking status: Never Smoker   . Smokeless tobacco: Never Used  . Alcohol Use: No  . Drug Use: No   . Sexual Activity: Not on file   Other Topics Concern  . Not on file   Social History Narrative   ROS-see HPI   Negative unless "+" Constitutional:    weight loss, night sweats, fevers, chills, + fatigue, lassitude. HEENT:    headaches, difficulty swallowing, tooth/dental problems, sore throat,       sneezing, itching, ear ache, nasal congestion, post nasal drip, snoring CV:    chest pain, orthopnea, PND, swelling in lower extremities, anasarca,                                                       dizziness, palpitations Resp:   shortness of breath with exertion or at rest.                productive cough,   non-productive cough, coughing up of blood.              change in color of mucus.  wheezing.   Skin:    rash or lesions. GI:  No-   heartburn, indigestion, abdominal pain, nausea, vomiting, diarrhea,                 change in bowel habits, loss of appetite GU: dysuria, change in color of urine, no urgency or frequency.   flank pain. MS:   joint pain, stiffness, decreased range of motion, back pain. Neuro-     nothing unusual Psych:  change in mood or affect.  depression or anxiety.   memory loss.  OBJ- Physical Exam General- Alert, Oriented, Affect-appropriate, Distress- none acute, + obese Skin- rash-none, lesions- none, excoriation- none Lymphadenopathy- none Head- atraumatic            Eyes- Gross vision intact, PERRLA, conjunctivae and secretions clear            Ears- Hearing, canals-normal            Nose- Clear, no-Septal dev, mucus, polyps, erosion, perforation             Throat- Mallampati IV, mucosa clear , drainage- none, tonsils- atrophic Neck- flexible , trachea midline, no stridor , thyroid nl, carotid no bruit Chest - symmetrical excursion , unlabored           Heart/CV- RRR , no murmur , no gallop  , no rub, nl s1 s2                           - JVD- none , edema- none, stasis changes- none, varices- none           Lung- clear to P&A, wheeze- none, cough-  none , dullness-none, rub- none  Chest wall-  Abd-  Br/ Gen/ Rectal- Not done, not indicated Extrem- cyanosis- none, clubbing, none, atrophy- none, strength- nl Neuro- grossly intact to observation

## 2014-11-05 NOTE — Patient Instructions (Addendum)
Order- Unattended home sleep test      Dx OSA  Ok to continue the trazodone and see how it affects your daytime sleepiness  I suggest you start trying to get an additional 30-60 minutes sleep per night.  Letter for you to show your employer

## 2014-11-23 DIAGNOSIS — G4733 Obstructive sleep apnea (adult) (pediatric): Secondary | ICD-10-CM | POA: Diagnosis not present

## 2014-11-25 ENCOUNTER — Other Ambulatory Visit: Payer: Self-pay | Admitting: *Deleted

## 2014-11-25 DIAGNOSIS — G4733 Obstructive sleep apnea (adult) (pediatric): Secondary | ICD-10-CM | POA: Diagnosis not present

## 2014-12-06 ENCOUNTER — Encounter: Payer: Self-pay | Admitting: Internal Medicine

## 2014-12-06 ENCOUNTER — Ambulatory Visit (INDEPENDENT_AMBULATORY_CARE_PROVIDER_SITE_OTHER): Payer: BLUE CROSS/BLUE SHIELD | Admitting: Internal Medicine

## 2014-12-06 VITALS — BP 128/72 | HR 57 | Ht 73.0 in | Wt 340.8 lb

## 2014-12-06 DIAGNOSIS — G4733 Obstructive sleep apnea (adult) (pediatric): Secondary | ICD-10-CM

## 2014-12-06 DIAGNOSIS — G4700 Insomnia, unspecified: Secondary | ICD-10-CM | POA: Diagnosis not present

## 2014-12-06 MED ORDER — ZOLPIDEM TARTRATE 10 MG PO TABS: 10.0000 mg | ORAL_TABLET | Freq: Every evening | ORAL | Status: DC | PRN

## 2014-12-06 NOTE — Assessment & Plan Note (Signed)
Treatment options, sleep hygiene, medical concerns, weight and driving responsibility reviewed. Plan-start CPAP with auto titration

## 2014-12-06 NOTE — Patient Instructions (Signed)
Order- new DME, new CPAP auto 4-2-, mask of choice, humidifier, supplies     Dx OSA  Script for ambien to try for sleep as needed  Please call as needed

## 2014-12-06 NOTE — Assessment & Plan Note (Signed)
He now says Main problem is waking after sleep onset with difficulty regaining sleep. I anticipate some difficulty adjusting to CPAP because he doesn't like things on his face. We will try Ambien to help him during the adaptation phase. Medication talk done.

## 2014-12-06 NOTE — Progress Notes (Signed)
:  03/14/08- Chief Complaint: Allergy Consult-Dr. Wilson-"extreme SOB with Rattles"-sleeps sitting up. Able to cough up green sputum yesterday; normally has a dry cough(usually happens mid afternoon and at night). Uses Nebulizer; has relief however pt doesnt have a rescue inhaler in hand. . History of Present Illness: 03/14/08- 57 yoM seen in allergy/pulmonary consult for Dr Andrey Campanile because of persistent wheeze and cough. Each year in Fall he catches colds, going back to 1990. There has usually been chest and head congestion. Strong odors and smoke tend to add irritation. He feels well once the episode clears. This year a similar acute illness is finally clearing after Avelox ending 1 week ago. Sputum is clearing from yellow. He was having to sleep sitting up. He has used a sample Advair and started a prednisone taper 2-3 weeks ago.he says usually a prednisone tapeer and depo shot have been sufficient before.  05/23/08- Dyspnea, bronchitis Hx of recurrent asthmatic bronchits each Fall with cold, persisting for long periods each winter. Got steadily better after initial visit in January. Finds now that he gets chest congestion quickly if he eats ice cream or a bagel. No problem with milk. Not bothered by pollen. Reviewed PFT- Mild reversible asthma /bronchitis pattern in small airways. He was off prednisone by that time and has remaned off. He ran out of Advair. Discussed pneumonia vaccine- never had it.  CONSULT 11/05/14- 5 yo M never smoker referred courtesy of Dr Benedetto Goad for sleep medicine evaluation; pt dozes off at any time-has been noticed at work.  Pt has hard time staying asleep-wakes up often and hard to get back to sleep (sometimes). Wife states pt snores and gasps for air; has never heard or seen patient stop breathing.  Wife here Previously seen at this office for asthmatic bronchitis LOV 2010  gets flu shot at work He complains of poor quality sleep. 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 He has had a chronic problem for years of repeated waking. Sleep onset is easy but he wakes 3 or 4 times each night without recognized discomfort. He may lie awake for hours. No sleep medicines until recently Dr. Andrey Campanile gave trazodone to try. No naps area daytime sleepiness if he sits quietly and he can fall asleep without realizing it. Denies problems driving. 4-5 cups of coffee before noon. ENT surgery-may be tonsils. Past history of bronchitis but no significant ongoing lung disease and no heart disease.  12/06/14- 63 yoM never smoker followed for obstructive sleep apnea, insomnia, complicated by morbid obesity FOLLOWS FOR: Review HST results with patient. Pt states that 1 tab of Trazodone did not help enough and 2 tabs make him drowsy the next morning. Unattended Home Sleep Test- 11/23/14- severe obstructive sleep apnea, AHI 68.3/ hr, desat to 63%, weight 344 lbs We discussed treatment options and/or starting CPAP with auto titration.  ROS-see HPI   Negative unless "+" Constitutional:    weight loss, night sweats, fevers, chills, + fatigue, lassitude. HEENT:    headaches, difficulty swallowing, tooth/dental problems, sore throat,       sneezing, itching, ear ache, nasal congestion, post nasal drip, snoring CV:    chest pain, orthopnea, PND, swelling in lower extremities, anasarca,  dizziness, palpitations Resp:   shortness of breath with exertion or at rest.                productive cough,   non-productive cough, coughing up of blood.              change in color of mucus.  wheezing.   Skin:    rash or lesions. GI:  No-   heartburn, indigestion, abdominal pain, nausea, vomiting,  GU:  MS:   joint pain, stiffness, decreased range of motion, back  pain. Neuro-     nothing unusual Psych:  change in mood or affect.  depression or anxiety.   memory loss.  OBJ- Physical Exam General- Alert, Oriented, Affect-appropriate, Distress- none acute, + morbidly obese Skin- rash-none, lesions- none, excoriation- none Lymphadenopathy- none Head- atraumatic            Eyes- Gross vision intact, PERRLA, conjunctivae and secretions clear            Ears- Hearing, canals-normal            Nose- Clear, no-Septal dev, mucus, polyps, erosion, perforation             Throat- Mallampati IV, mucosa clear , drainage- none, tonsils- atrophic Neck- flexible , trachea midline, no stridor , thyroid nl, carotid no bruit Chest - symmetrical excursion , unlabored           Heart/CV- RRR , no murmur , no gallop  , no rub, nl s1 s2                           - JVD- none , edema- none, stasis changes- none, varices- none           Lung- clear to P&A, wheeze- none, cough- none , dullness-none, rub- none           Chest wall-  Abd-  Br/ Gen/ Rectal- Not done, not indicated Extrem- cyanosis- none, clubbing, none, atrophy- none, strength- nl Neuro- grossly intact to observation

## 2014-12-06 NOTE — Assessment & Plan Note (Signed)
Once seated abscess to CPAP we will consider bariatric referral

## 2015-01-27 ENCOUNTER — Inpatient Hospital Stay (HOSPITAL_COMMUNITY)
Admission: RE | Admit: 2015-01-27 | Discharge: 2015-01-27 | Disposition: A | Discharge status: Home / Self Care | Payer: BLUE CROSS/BLUE SHIELD | Source: Ambulatory Visit

## 2015-01-27 NOTE — Progress Notes (Signed)
Pt not here for his PAT appointment message left for him to return call.

## 2015-02-06 ENCOUNTER — Encounter (HOSPITAL_COMMUNITY): Payer: Self-pay

## 2015-02-06 ENCOUNTER — Ambulatory Visit (HOSPITAL_COMMUNITY): Admit: 2015-02-06 | Payer: No Typology Code available for payment source | Admitting: Orthopedic Surgery

## 2015-02-06 SURGERY — ARTHROPLASTY, SHOULDER, TOTAL
Anesthesia: General | Site: Shoulder | Laterality: Left

## 2015-02-19 ENCOUNTER — Ambulatory Visit: Payer: BLUE CROSS/BLUE SHIELD | Admitting: Internal Medicine

## 2015-05-29 ENCOUNTER — Encounter: Payer: Self-pay | Admitting: Internal Medicine

## 2015-05-29 ENCOUNTER — Ambulatory Visit (INDEPENDENT_AMBULATORY_CARE_PROVIDER_SITE_OTHER): Payer: BLUE CROSS/BLUE SHIELD | Admitting: Internal Medicine

## 2015-05-29 VITALS — BP 126/74 | HR 84 | Ht 73.0 in | Wt 347.6 lb

## 2015-05-29 DIAGNOSIS — G4733 Obstructive sleep apnea (adult) (pediatric): Secondary | ICD-10-CM | POA: Diagnosis not present

## 2015-05-29 DIAGNOSIS — G4700 Insomnia, unspecified: Secondary | ICD-10-CM | POA: Diagnosis not present

## 2015-05-29 MED ORDER — ZOLPIDEM TARTRATE 10 MG PO TABS: 10.0000 mg | ORAL_TABLET | Freq: Every evening | ORAL | Status: DC | PRN

## 2015-05-29 NOTE — Progress Notes (Signed)
:  03/14/08- Chief Complaint: Allergy Consult-Dr. Wilson-"extreme SOB with Rattles"-sleeps sitting up. Able to cough up green sputum yesterday; normally has a dry cough(usually happens mid afternoon and at night). Uses Nebulizer; has relief however pt doesnt have a rescue inhaler in hand. . History of Present Illness: 03/14/08- 57 yoM seen in allergy/pulmonary consult for Dr Andrey Campanile because of persistent wheeze and cough. Each year in Fall he catches colds, going back to 1990. There has usually been chest and head congestion. Strong odors and smoke tend to add irritation. He feels well once the episode clears. This year a similar acute illness is finally clearing after Avelox ending 1 week ago. Sputum is clearing from yellow. He was having to sleep sitting up. He has used a sample Advair and started a prednisone taper 2-3 weeks ago.he says usually a prednisone tapeer and depo shot have been sufficient before.  05/23/08- Dyspnea, bronchitis Hx of recurrent asthmatic bronchits each Fall with cold, persisting for long periods each winter. Got steadily better after initial visit in January. Finds now that he gets chest congestion quickly if he eats ice cream or a bagel. No problem with milk. Not bothered by pollen. Reviewed PFT- Mild reversible asthma /bronchitis pattern in small airways. He was off prednisone by that time and has remaned off. He ran out of Advair. Discussed pneumonia vaccine- never had it.  CONSULT 11/05/14- 44 yo M never smoker referred courtesy of Dr Benedetto Goad for sleep medicine evaluation; pt dozes off at any time-has been noticed at work.  Pt has hard time staying asleep-wakes up often and hard to get back to sleep (sometimes). Wife states pt snores and gasps for air; has never heard or seen patient stop breathing.  Wife here Previously seen at this office for asthmatic bronchitis LOV 2010  gets flu shot at work He complains of poor quality sleep. 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 He has had a chronic problem for years of repeated waking. Sleep onset is easy but he wakes 3 or 4 times each night without recognized discomfort. He may lie awake for hours. No sleep medicines until recently Dr. Andrey Campanile gave trazodone to try. No naps area daytime sleepiness if he sits quietly and he can fall asleep without realizing it. Denies problems driving. 4-5 cups of coffee before noon. ENT surgery-may be tonsils. Past history of bronchitis but no significant ongoing lung disease and no heart disease.  12/06/14- 63 yoM never smoker followed for obstructive sleep apnea, insomnia, complicated by morbid obesity FOLLOWS FOR: Review HST results with patient. Pt states that 1 tab of Trazodone did not help enough and 2 tabs make him drowsy the next morning. Unattended Home Sleep Test- 11/23/14- severe obstructive sleep apnea, AHI 68.3/ hr, desat to 63%, weight 344 lbs We discussed treatment options and/or starting CPAP with auto titration.  05/29/2015-65 year old male never smoker followed for OSA, insomnia, complicated by morbid obesity CPAP auto 4-20/[facility] FOLLOWS FOR: Pt wears CPAP every night for about 3- 4 hours-increased stress at this time. Able to rest better with Ambien. DME [employer]. Reminded importance of weight loss.  ROS-see HPI   Negative unless "+" Constitutional:    weight loss, night sweats, fevers, chills, + fatigue, lassitude. HEENT:    headaches, difficulty  swallowing, tooth/dental problems, sore throat,       sneezing, itching, ear ache, nasal congestion, post nasal drip, snoring CV:    chest pain, orthopnea, PND, swelling in lower extremities, anasarca,                                                       dizziness, palpitations Resp:   shortness of breath with  exertion or at rest.                productive cough,   non-productive cough, coughing up of blood.              change in color of mucus.  wheezing.   Skin:    rash or lesions. GI:  No-   heartburn, indigestion, abdominal pain, nausea, vomiting,  GU:  MS:   joint pain, stiffness, decreased range of motion, back pain. Neuro-     nothing unusual Psych:  change in mood or affect.  depression or anxiety.   memory loss.  OBJ- Physical Exam General- Alert, Oriented, Affect-appropriate, Distress- none acute, + morbidly obese Skin- rash-none, lesions- none, excoriation- none Lymphadenopathy- none Head- atraumatic            Eyes- Gross vision intact, PERRLA, conjunctivae and secretions clear            Ears- Hearing, canals-normal            Nose- Clear, no-Septal dev, mucus, polyps, erosion, perforation             Throat- Mallampati IV, mucosa clear , drainage- none, tonsils- atrophic Neck- flexible , trachea midline, no stridor , thyroid nl, carotid no bruit Chest - symmetrical excursion , unlabored           Heart/CV- RRR , no murmur , no gallop  , no rub, nl s1 s2                           - JVD- none , edema- none, stasis changes- none, varices- none           Lung- clear to P&A, wheeze- none, cough- none , dullness-none, rub- none           Chest wall-  Abd-  Br/ Gen/ Rectal- Not done, not indicated Extrem- cyanosis- none, clubbing, none, atrophy- none, strength- nl Neuro- grossly intact to observation

## 2015-05-29 NOTE — Patient Instructions (Signed)
Our goal is to use CPAP any time you are sleeping  We can leave the pressure set auto 4-20 for now  Please call if we can do anything to make CPAP more comfortable for you

## 2015-06-01 NOTE — Assessment & Plan Note (Signed)
Discussed importance of basic sleep hygiene and support role of occasional Ambien use.

## 2015-06-01 NOTE — Assessment & Plan Note (Signed)
We discussed guidelines for compliance and control, emphasizing that he will cope better if he gets enough sleep.

## 2015-06-06 ENCOUNTER — Encounter: Payer: Self-pay | Admitting: Internal Medicine

## 2015-10-09 ENCOUNTER — Encounter: Payer: Self-pay | Admitting: Internal Medicine

## 2015-10-09 ENCOUNTER — Ambulatory Visit (INDEPENDENT_AMBULATORY_CARE_PROVIDER_SITE_OTHER): Payer: BLUE CROSS/BLUE SHIELD | Admitting: Internal Medicine

## 2015-10-09 VITALS — BP 128/74 | HR 96 | Ht 73.0 in | Wt 354.6 lb

## 2015-10-09 DIAGNOSIS — G4733 Obstructive sleep apnea (adult) (pediatric): Secondary | ICD-10-CM | POA: Diagnosis not present

## 2015-10-09 MED ORDER — ZOLPIDEM TARTRATE 10 MG PO TABS: 10.0000 mg | ORAL_TABLET | Freq: Every evening | ORAL | 5 refills | Status: DC | PRN

## 2015-10-09 NOTE — Assessment & Plan Note (Addendum)
He needs better compliance with CPAP but is comfortable with auto titration 4-20 and gets excellent control. We reviewed goals. Plan-he is encouraged to be more compliant

## 2015-10-09 NOTE — Assessment & Plan Note (Signed)
He is much too heavy but doesn't seem motivated to make lifestyle changes needed to improve this.

## 2015-10-09 NOTE — Progress Notes (Signed)
:  03/14/08- Chief Complaint: Allergy Consult-Dr. Wilson-"extreme SOB with Rattles"-sleeps sitting up. Able to cough up green sputum yesterday; normally has a dry cough(usually happens mid afternoon and at night). Uses Nebulizer; has relief however pt doesnt have a rescue inhaler in hand. . History of Present Illness: 03/14/08- 57 yoM seen in allergy/pulmonary consult for Dr Andrey CampanileWilson because of persistent wheeze and cough. Each year in Fall he catches colds, going back to 1990. There has usually been chest and head congestion. Strong odors and smoke tend to add irritation. He feels well once the episode clears. This year a similar acute illness is finally clearing after Avelox ending 1 week ago. Sputum is clearing from yellow. He was having to sleep sitting up. He has used a sample Advair and started a prednisone taper 2-3 weeks ago.he says usually a prednisone tapeer and depo shot have been sufficient before.  05/23/08- Dyspnea, bronchitis Hx of recurrent asthmatic bronchits each Fall with cold, persisting for long periods each winter. Got steadily better after initial visit in January. Finds now that he gets chest congestion quickly if he eats ice cream or a bagel. No problem with milk. Not bothered by pollen. Reviewed PFT- Mild reversible asthma /bronchitis pattern in small airways. He was off prednisone by that time and has remaned off. He ran out of Advair. Discussed pneumonia vaccine- never had it.  CONSULT 11/05/14- 65 yo M never smoker referred courtesy of Dr Benedetto GoadFred Wilson for sleep medicine evaluation; pt dozes off at any time-has been noticed at work.  Pt has hard time staying asleep-wakes up often and hard to get back to sleep (sometimes). Wife states pt snores and gasps for air; has never heard or seen patient stop breathing.  Wife here Previously seen at this office for asthmatic bronchitis LOV 2010  gets flu shot at work He complains of poor quality sleep. 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 He has had a chronic problem for years of repeated waking. Sleep onset is easy but he wakes 3 or 4 times each night without recognized discomfort. He may lie awake for hours. No sleep medicines until recently Dr. Andrey CampanileWilson gave trazodone to try. No naps area daytime sleepiness if he sits quietly and he can fall asleep without realizing it. Denies problems driving. 4-5 cups of coffee before noon. ENT surgery-may be tonsils. Past history of bronchitis but no significant ongoing lung disease and no heart disease.  12/06/14- 63 yoM never smoker followed for obstructive sleep apnea, insomnia, complicated by morbid obesity FOLLOWS FOR: Review HST results with patient. Pt states that 1 tab of Trazodone did not help enough and 2 tabs make him drowsy the next morning. Unattended Home Sleep Test- 11/23/14- severe obstructive sleep apnea, AHI 68.3/ hr, desat to 63%, weight 344 lbs We discussed treatment options and/or starting CPAP with auto titration.  05/29/2015-65 year old male never smoker followed for OSA, insomnia, complicated by morbid obesity, asthma/bronchitis CPAP auto 4-20/[facility] FOLLOWS FOR: Pt wears CPAP every night for about 3- 4 hours-increased stress at this time. Able to rest better with Ambien. DME [facility]. Reminded importance of weight loss.  10/09/2015-65 year old male never smoker followed for OSA, insomnia, complicated by morbid obesity, asthma/bronchitis CPAP auto 4-20/[facility] FOLLOWS FOR: WRU:[id number]:[facility]; DL attached. Pt states  he wears CPAP nightly for about 4-5 hours. Pt needs order for new supplies and nasal pillows. He says life is better with CPAP but offers various reasons why download compliance is less than desired-53%/4 hour. Control is good-AHI  1.7. Sometimes falls asleep before he puts it on. Uses an occasional Ambien. Says many nights sleep time is short, limited by his work schedule and work clear doing to build new home.  ROS-see HPI   Negative unless "+" Constitutional:    weight loss, night sweats, fevers, chills, + fatigue, lassitude. HEENT:    headaches, difficulty swallowing, tooth/dental problems, sore throat,       sneezing, itching, ear ache, nasal congestion, post nasal drip, snoring CV:    chest pain, orthopnea, PND, swelling in lower extremities, anasarca,                                                       dizziness, palpitations Resp:   shortness of breath with exertion or at rest.                productive cough,   non-productive cough, coughing up of blood.              change in color of mucus.  wheezing.   Skin:    rash or lesions. GI:  No-   heartburn, indigestion, abdominal pain, nausea, vomiting,  GU:  MS:   joint pain, stiffness, decreased range of motion, back pain. Neuro-     nothing unusual Psych:  change in mood or affect.  depression or anxiety.   memory loss.  OBJ- Physical Exam General- Alert, Oriented, Affect-appropriate, Distress- none acute, + morbidly obese Skin- rash-none, lesions- none, excoriation- none Lymphadenopathy- none Head- atraumatic            Eyes- Gross vision intact, PERRLA, conjunctivae and secretions clear            Ears- Hearing, canals-normal            Nose- Clear, no-Septal dev, mucus, polyps, erosion, perforation             Throat- Mallampati IV, mucosa clear , drainage- none, tonsils- atrophic Neck- flexible , trachea midline, no stridor , thyroid nl, carotid no bruit Chest - symmetrical excursion , unlabored           Heart/CV- RRR , no murmur , no gallop  , no rub, nl s1 s2                           - JVD- none , edema- none, stasis changes- none, varices- none           Lung- clear to P&A, wheeze- none, cough- none , dullness-none, rub- none           Chest  wall-  Abd-  Br/ Gen/ Rectal- Not done, not indicated Extrem- cyanosis- none, clubbing, none, atrophy- none, strength- nl Neuro- grossly intact to observation

## 2015-10-09 NOTE — Patient Instructions (Signed)
Keep trying to use the CPAP whenever you sleep- our minimum goal is at least 4 hours on at least 70% of nights.  Order- DME [facility]-  continue CPAP auto 5-20,needing replacement mask of choice and supplies, humidifier, AirView   Please call if needed

## 2015-11-03 ENCOUNTER — Encounter: Payer: Self-pay | Admitting: Internal Medicine

## 2015-11-14 DIAGNOSIS — F5104 Psychophysiologic insomnia: Secondary | ICD-10-CM

## 2015-11-14 DIAGNOSIS — M1710 Unilateral primary osteoarthritis, unspecified knee: Secondary | ICD-10-CM

## 2015-11-14 DIAGNOSIS — M179 Osteoarthritis of knee, unspecified: Secondary | ICD-10-CM

## 2015-11-14 HISTORY — DX: Osteoarthritis of knee, unspecified: M17.9

## 2015-11-14 HISTORY — DX: Psychophysiologic insomnia: F51.04

## 2015-11-14 HISTORY — DX: Morbid (severe) obesity due to excess calories: E66.01

## 2015-11-14 HISTORY — DX: Unilateral primary osteoarthritis, unspecified knee: M17.10

## 2016-01-19 DIAGNOSIS — M25512 Pain in left shoulder: Secondary | ICD-10-CM

## 2016-01-19 DIAGNOSIS — G8929 Other chronic pain: Secondary | ICD-10-CM

## 2016-01-19 HISTORY — DX: Other chronic pain: G89.29

## 2016-04-14 ENCOUNTER — Other Ambulatory Visit: Payer: Self-pay | Admitting: Internal Medicine

## 2016-04-14 NOTE — Telephone Encounter (Signed)
CY please advise on refill. Thanks. 

## 2016-04-14 NOTE — Telephone Encounter (Signed)
Ok to refill 6 months 

## 2016-04-15 NOTE — Telephone Encounter (Signed)
Rx has been called in per CY. Nothing further was needed. 

## 2016-07-28 ENCOUNTER — Other Ambulatory Visit (HOSPITAL_COMMUNITY): Payer: Self-pay | Admitting: Orthopedic Surgery

## 2016-07-28 DIAGNOSIS — Z96651 Presence of right artificial knee joint: Secondary | ICD-10-CM

## 2016-08-03 ENCOUNTER — Ambulatory Visit (HOSPITAL_COMMUNITY): Payer: BLUE CROSS/BLUE SHIELD

## 2016-08-03 ENCOUNTER — Other Ambulatory Visit (HOSPITAL_COMMUNITY): Payer: BLUE CROSS/BLUE SHIELD

## 2016-08-09 ENCOUNTER — Encounter (HOSPITAL_COMMUNITY)
Admission: RE | Admit: 2016-08-09 | Discharge: 2016-08-09 | Disposition: A | Discharge status: Home / Self Care | Payer: Worker's Compensation | Source: Ambulatory Visit | Attending: Orthopedic Surgery | Admitting: Orthopedic Surgery

## 2016-08-09 DIAGNOSIS — Z96651 Presence of right artificial knee joint: Secondary | ICD-10-CM | POA: Diagnosis not present

## 2016-08-09 MED ORDER — TECHNETIUM TC 99M MEDRONATE IV KIT
21.8000 | PACK | Freq: Once | INTRAVENOUS | Status: AC | PRN
  Administered 2016-08-09: 21.8 via INTRAVENOUS

## 2016-08-24 ENCOUNTER — Encounter: Payer: Self-pay | Admitting: Gastroenterology

## 2016-10-26 ENCOUNTER — Encounter: Payer: Self-pay | Admitting: Gastroenterology

## 2016-11-03 ENCOUNTER — Other Ambulatory Visit: Payer: Self-pay | Admitting: Internal Medicine

## 2016-11-03 NOTE — Telephone Encounter (Signed)
OK refill total 6 months

## 2016-11-03 NOTE — Telephone Encounter (Signed)
CY please advise on refill. Thanks. 

## 2016-11-05 ENCOUNTER — Encounter: Payer: Self-pay | Admitting: Gastroenterology

## 2016-12-10 ENCOUNTER — Ambulatory Visit (AMBULATORY_SURGERY_CENTER): Payer: Self-pay | Admitting: *Deleted

## 2016-12-10 VITALS — Ht 73.0 in | Wt 349.6 lb

## 2016-12-10 DIAGNOSIS — Z1211 Encounter for screening for malignant neoplasm of colon: Secondary | ICD-10-CM

## 2016-12-10 MED ORDER — NA SULFATE-K SULFATE-MG SULF 17.5-3.13-1.6 GM/177ML PO SOLN: 1.0000 | Freq: Once | ORAL | 0 refills | Status: AC

## 2016-12-10 NOTE — Progress Notes (Signed)
No egg or soy allergy known to patient  No issues with past sedation with any surgeries  or procedures, no intubation problems  No diet pills per patient No home 02 use per patient  No blood thinners per patient  Pt denies issues with constipation  No A fib or A flutter  EMMI video sent to pt's e mail pt declined   

## 2016-12-15 ENCOUNTER — Encounter: Payer: Self-pay | Admitting: Gastroenterology

## 2016-12-24 ENCOUNTER — Ambulatory Visit: Payer: BLUE CROSS/BLUE SHIELD | Admitting: Gastroenterology

## 2016-12-24 ENCOUNTER — Encounter: Payer: Self-pay | Admitting: Gastroenterology

## 2016-12-24 VITALS — BP 159/105 | HR 90 | Temp 97.3°F | Ht 73.0 in | Wt 349.0 lb

## 2016-12-24 MED ORDER — SODIUM CHLORIDE 0.9 % IV SOLN: 500.0000 mL | INTRAVENOUS | Status: DC

## 2016-12-24 NOTE — Progress Notes (Signed)
Pt attached to monitor- - irregular fast HR-90-125- he has A Fib per the strip.  New onset- no A Fib in past medical history Notified Dr Adela LankArmbruster.  Notified CRNA-   Dr Adela LankArmbruster to bedside with pt and wife-  MD informed pt about find of new onset A Fib and need to cancel colon today for pt's safety.   Dr Merlyn AlbertFred Wilson's office called and pt to come straight to his office today to be seen . Will see PA but Dr Andrey CampanileWilson is there. Strips printed and sent with pt for the PCP office.  Dr Adela LankArmbruster aware pt to be seen now- he is to leave here and go there-  Dr Adela LankArmbruster ok with that. Wife and pt left admitting- pt in no distress at this time   Bufford SpikesMarie McCraw RN

## 2016-12-24 NOTE — Progress Notes (Signed)
Patient came in for screening colonoscopy today. He feels normal - no palpitations, shortness of breath, or chest pains. He is in new onset atrial fibrillation when on the monitor today - (HR low 100s - 100-120s), BP actually high (160/90s), he feels well. Unclear how long this has been going on as he's asymptomatic. Unfortunately he we can't proceed with colonoscopy in this setting. We have contacted his primary care physician, this does appear new, and they have agreed to see him right away in their office. If they could not see him it would be recommended he go to the ER. Discussed atrial fibrillation with the patient and his wife, workup and likely treatment that is needed. Given he is asymptomatic and feels well I think okay for him to go directly to his primary care office rather than ED. He agreed and will reschedule once this acute issue has been addressed.

## 2016-12-27 ENCOUNTER — Telehealth: Payer: Self-pay | Admitting: *Deleted

## 2016-12-27 NOTE — Telephone Encounter (Signed)
NOTES SENT TO SCHEDULING.  °

## 2016-12-29 ENCOUNTER — Telehealth: Payer: Self-pay

## 2016-12-29 NOTE — Telephone Encounter (Signed)
SENT NOTES TO SCHEDULING 

## 2017-01-05 ENCOUNTER — Encounter: Payer: Self-pay | Admitting: Cardiovascular Disease

## 2017-01-13 NOTE — Progress Notes (Signed)
Cardiology Office Note   Date:  01/17/2017   ID:  Brent Dougherty, DOB 12/06/1950, MRN [id number]  PCP:  [name], [name], MD  Cardiologist:   Charlton HawsPeter Virgen Belland, MD   No chief complaint on file.     History of Present Illness: Brent Dougherty is a 66 y.o. male who presents for consultation regarding new onset atrial fibrillation. Referred by Dr Andrey CampanileWilson and Rueben BashBreejante Williams PA-C [facility]. Reviewed office note from 12/24/16 BP  Poorly controlled 162/110 mmHg. Rhythm noted when he showed up to have colonoscopy. No symptoms of palpitations, syncope, chest pain or dyspnea Lots of caffeine and mountain dew BP has not been Rx. Sedentary with poor diet Some dependant edema. OSA wears CPAP. Father had MI with premature CAD. Started on Cardizem 120 mg but no anticoagulation   This patients CHA2DS2-VASc Score and unadjusted Ischemic Stroke Rate (% per year) is equal to 2.2 % stroke rate/year from a score of 2  Above score calculated as 1 point each if present [CHF, HTN, DM, Vascular=MI/PAD/Aortic Plaque, Age if 65-74, or Male] Above score calculated as 2 points each if present [Age > 75, or Stroke/TIA/TE]        Past Medical History:  Diagnosis Date  . Arthritis    BOTH KNEES  . Asthma    uncomplicated  . Bronchitis    LAST TIME WAS JAN 2014  . Chronic insomnia 11/14/2015  . Chronic left shoulder pain 01/19/2016  . Elevated blood pressure reading   . History of kidney stones   . Morbid obesity (HCC) 11/14/2015  . New onset a-fib (HCC)   . Obstructive chronic bronchitis without exacerbation (HCC) 03/14/2008  . Osteoarthritis of knee 11/14/2015  . Other general symptoms and signs 02/29/2008  . PONV (postoperative nausea and vomiting)   . Sleep apnea    cpap wears at times not nightly     Past Surgical History:  Procedure Laterality Date  . BILATERAL KNEE ARTHROSCOPY    . PARTIAL AMPUTATION LEFT THUMB AND TENDON REPAIR    . TONSILLECTOMY AND  ADENOIDECTOMY    . TOTAL KNEE ARTHROPLASTY Right 06/19/2012   Procedure: RIGHT TOTAL KNEE ARTHROPLASTY;  Surgeon: [name], MD;  Location: [facility];  Service: Orthopedics;  Laterality: Right;     Current Outpatient Medications  Medication Sig Dispense Refill  . diltiazem (TIAZAC) 120 MG 24 hr capsule Take 120 mg by mouth daily.    . Ibuprofen (ADVIL) 200 MG CAPS Take by mouth.    . zolpidem (AMBIEN) 10 MG tablet TAKE 1 TABLET BY MOUTH AT BEDTIME 30 tablet 5   Current Facility-Administered Medications  Medication Dose Route Frequency Provider Last Rate Last Dose  . 0.9 %  sodium chloride infusion  500 mL Intravenous Continuous Armbruster, Willaim RayasSteven P, MD        Allergies:   Morphine and related    Social History:  The patient  reports that  has never smoked. he has never used smokeless tobacco. He reports that he does not drink alcohol or use drugs.   Family History:  The patient's family history includes Cancer in his mother; Cancer - Other in his father and paternal grandfather; Diabetes in his father and paternal grandfather; Heart disease in his father and paternal grandfather; Prostate cancer in his paternal grandfather.    ROS:  Please see the history of present illness.   Otherwise, review of systems are positive for none.   All other systems are reviewed  and negative.    PHYSICAL EXAM: VS:  BP (!) 164/102   Pulse 92   Ht 6\' 1"  (1.854 m)   Wt (!) 352 lb (159.7 kg)   SpO2 97%   BMI 46.44 kg/m  , BMI Body mass index is 46.44 kg/m. Affect appropriate Healthy:  appears stated age HEENT: normal Neck supple with no adenopathy JVP normal no bruits no thyromegaly Lungs clear with no wheezing and good diaphragmatic motion Heart:  S1/S2 no murmur, no rub, gallop or click PMI normal Abdomen: benighn, BS positve, no tenderness, no AAA no bruit.  No HSM or HJR Distal pulses intact with no bruits No edema Neuro non-focal Skin warm and dry No muscular weakness    EKG:   12/24/16 Afibr rate 105 LAD Possible old IMI Q waves 3,F 12/3/8 Afib rate 89 LAD    Recent Labs: No results found for requested labs within last 8760 hours.    Lipid Panel No results found for: CHOL, TRIG, HDL, CHOLHDL, VLDL, LDLCALC, LDLDIRECT    Wt Readings from Last 3 Encounters:  01/17/17 (!) 352 lb (159.7 kg)  12/24/16 (!) 349 lb (158.3 kg)  12/10/16 (!) 349 lb 9.6 oz (158.6 kg)      Other studies Reviewed: Additional studies/ records that were reviewed today include: [facility] labs ECG.    ASSESSMENT AND PLAN:  1.  Afib:  Likely more chronic. Risk factors include OSA, obesity and poorly Rx HTN. Start xarelto, add Lopressor for rate control Echo to assess EF and atrial size and assess feasibility of [facility]  Discussion with wife and patient about diagnosis , natural history rate control, anticoagulation and conversion Strategies.  2. HTN poor control adding beta blocker and diuretic 3. Edema: from venous insuficiency and obesity with some skin breakdown and stasis Adding HCTZ Encouraged to wear compression stockings 4. Ortho: post right TKR. Larey SeatFell and needs revision with Dr [name] in February Will need to hold xarelto 2 days before surgery.  5. OSA:  Encouraged him to wear nightly and not " when I need it" Link to PAF discussed 6. Obesity:  Discussed bariatric surgery. Has lost weight in past with weight watchers.   Had labs with Dr Andrey CampanileWilson today will get to see what Cr/K and blood counts were .    Current medicines are reviewed at length with the patient today.  The patient does not have concerns regarding medicines.  The following changes have been made:  HCTZ, 12.5 , lopressor 25 bid xarelto 20 mg   Labs/ tests ordered today include: Echo  No orders of the defined types were placed in this encounter.    Disposition:   FU with me next available      Signed, Charlton HawsPeter Lyn Joens, MD  01/17/2017 4:17 PM    [facility] [address], [place], [place]  [id number] Phone: [phone]; Fax: [phone]

## 2017-01-15 DIAGNOSIS — I4891 Unspecified atrial fibrillation: Secondary | ICD-10-CM

## 2017-01-15 HISTORY — DX: Unspecified atrial fibrillation: I48.91

## 2017-01-17 ENCOUNTER — Ambulatory Visit (INDEPENDENT_AMBULATORY_CARE_PROVIDER_SITE_OTHER): Payer: BLUE CROSS/BLUE SHIELD | Admitting: Cardiovascular Disease

## 2017-01-17 ENCOUNTER — Encounter: Payer: Self-pay | Admitting: Cardiovascular Disease

## 2017-01-17 VITALS — BP 164/102 | HR 92 | Ht 73.0 in | Wt 352.0 lb

## 2017-01-17 DIAGNOSIS — I4891 Unspecified atrial fibrillation: Secondary | ICD-10-CM

## 2017-01-17 MED ORDER — HYDROCHLOROTHIAZIDE 12.5 MG PO CAPS: 12.5000 mg | ORAL_CAPSULE | Freq: Every day | ORAL | 3 refills | Status: DC

## 2017-01-17 MED ORDER — METOPROLOL TARTRATE 25 MG PO TABS: 25.0000 mg | ORAL_TABLET | Freq: Two times a day (BID) | ORAL | 3 refills | Status: DC

## 2017-01-17 MED ORDER — RIVAROXABAN 20 MG PO TABS: 20.0000 mg | ORAL_TABLET | Freq: Every day | ORAL | 3 refills | Status: DC

## 2017-01-17 NOTE — Patient Instructions (Addendum)
Medication Instructions:  Your physician has recommended you make the following change in your medication:  1-START Hydrochlorothiazide 12.5 mg by mouth daily (take when you get home from work or in the evening) 2-START Metoprolol 25 mg by mouth twice daily  (take when you wake up) 3-START Xarelto 20 mg by mouth daily (take with the biggest meal of the day) 4-Take your diltiazem in the middle of your day.  Labwork: NONE  Testing/Procedures: Your physician has requested that you have an echocardiogram. Echocardiography is a painless test that uses sound waves to create images of your heart. It provides your doctor with information about the size and shape of your heart and how well your heart's chambers and valves are working. This procedure takes approximately one hour. There are no restrictions for this procedure.  Follow-Up: Your physician wants you to follow-up next available  with Dr. Eden EmmsNishan.    If you need a refill on your cardiac medications before your next appointment, please call your pharmacy.   Hydrochlorothiazide, HCTZ capsules or tablets What is this medicine? HYDROCHLOROTHIAZIDE (hye droe klor oh THYE a zide) is a diuretic. It increases the amount of urine passed, which causes the body to lose salt and water. This medicine is used to treat high blood pressure. It is also reduces the swelling and water retention caused by various medical conditions, such as heart, liver, or kidney disease. This medicine may be used for other purposes; ask your health care provider or pharmacist if you have questions. COMMON BRAND NAME(S): Esidrix, Ezide, HydroDIURIL, Microzide, Oretic, Zide What should I tell my health care provider before I take this medicine? They need to know if you have any of these conditions: -diabetes -gout -immune system problems, like lupus -kidney disease or kidney stones -liver disease -pancreatitis -small amount of urine or difficulty passing urine -an  unusual or allergic reaction to hydrochlorothiazide, sulfa drugs, other medicines, foods, dyes, or preservatives -pregnant or trying to get pregnant -breast-feeding How should I use this medicine? Take this medicine by mouth with a glass of water. Follow the directions on the prescription label. Take your medicine at regular intervals. Remember that you will need to pass urine frequently after taking this medicine. Do not take your doses at a time of day that will cause you problems. Do not stop taking your medicine unless your doctor tells you to. Talk to your pediatrician regarding the use of this medicine in children. Special care may be needed. Overdosage: If you think you have taken too much of this medicine contact a poison control center or emergency room at once. NOTE: This medicine is only for you. Do not share this medicine with others. What if I miss a dose? If you miss a dose, take it as soon as you can. If it is almost time for your next dose, take only that dose. Do not take double or extra doses. What may interact with this medicine? -cholestyramine -colestipol -digoxin -dofetilide -lithium -medicines for blood pressure -medicines for diabetes -medicines that relax muscles for surgery -other diuretics -steroid medicines like prednisone or cortisone This list may not describe all possible interactions. Give your health care provider a list of all the medicines, herbs, non-prescription drugs, or dietary supplements you use. Also tell them if you smoke, drink alcohol, or use illegal drugs. Some items may interact with your medicine. What should I watch for while using this medicine? Visit your doctor or health care professional for regular checks on your progress. Check your  blood pressure as directed. Ask your doctor or health care professional what your blood pressure should be and when you should contact him or her. You may need to be on a special diet while taking this  medicine. Ask your doctor. Check with your doctor or health care professional if you get an attack of severe diarrhea, nausea and vomiting, or if you sweat a lot. The loss of too much body fluid can make it dangerous for you to take this medicine. You may get drowsy or dizzy. Do not drive, use machinery, or do anything that needs mental alertness until you know how this medicine affects you. Do not stand or sit up quickly, especially if you are an older patient. This reduces the risk of dizzy or fainting spells. Alcohol may interfere with the effect of this medicine. Avoid alcoholic drinks. This medicine may affect your blood sugar level. If you have diabetes, check with your doctor or health care professional before changing the dose of your diabetic medicine. This medicine can make you more sensitive to the sun. Keep out of the sun. If you cannot avoid being in the sun, wear protective clothing and use sunscreen. Do not use sun lamps or tanning beds/booths. What side effects may I notice from receiving this medicine? Side effects that you should report to your doctor or health care professional as soon as possible: -allergic reactions such as skin rash or itching, hives, swelling of the lips, mouth, tongue, or throat -changes in vision -chest pain -eye pain -fast or irregular heartbeat -feeling faint or lightheaded, falls -gout attack -muscle pain or cramps -pain or difficulty when passing urine -pain, tingling, numbness in the hands or feet -redness, blistering, peeling or loosening of the skin, including inside the mouth -unusually weak or tired Side effects that usually do not require medical attention (report to your doctor or health care professional if they continue or are bothersome): -change in sex drive or performance -dry mouth -headache -stomach upset This list may not describe all possible side effects. Call your doctor for medical advice about side effects. You may report side  effects to FDA at 1-800-FDA-1088. Where should I keep my medicine? Keep out of the reach of children. Store at room temperature between 15 and 30 degrees C (59 and 86 degrees F). Do not freeze. Protect from light and moisture. Keep container closed tightly. Throw away any unused medicine after the expiration date. NOTE: This sheet is a summary. It may not cover all possible information. If you have questions about this medicine, talk to your doctor, pharmacist, or health care provider.  2018 Elsevier/Gold Standard (2009-09-26 12:57:37)

## 2017-01-25 ENCOUNTER — Other Ambulatory Visit (HOSPITAL_COMMUNITY): Payer: Self-pay

## 2017-01-27 ENCOUNTER — Other Ambulatory Visit: Payer: Self-pay

## 2017-01-27 ENCOUNTER — Ambulatory Visit (HOSPITAL_COMMUNITY): Payer: BLUE CROSS/BLUE SHIELD | Attending: Cardiovascular Disease

## 2017-01-27 DIAGNOSIS — I4891 Unspecified atrial fibrillation: Secondary | ICD-10-CM

## 2017-01-27 MED ORDER — PERFLUTREN LIPID MICROSPHERE
1.0000 mL | INTRAVENOUS | Status: AC | PRN
  Administered 2017-01-27: 2 mL via INTRAVENOUS

## 2017-02-09 DIAGNOSIS — R112 Nausea with vomiting, unspecified: Secondary | ICD-10-CM | POA: Insufficient documentation

## 2017-02-09 DIAGNOSIS — M1990 Unspecified osteoarthritis, unspecified site: Secondary | ICD-10-CM | POA: Insufficient documentation

## 2017-02-09 DIAGNOSIS — G4730 Sleep apnea, unspecified: Secondary | ICD-10-CM | POA: Insufficient documentation

## 2017-02-09 DIAGNOSIS — R030 Elevated blood-pressure reading, without diagnosis of hypertension: Secondary | ICD-10-CM | POA: Insufficient documentation

## 2017-02-09 DIAGNOSIS — I4819 Other persistent atrial fibrillation: Secondary | ICD-10-CM | POA: Insufficient documentation

## 2017-02-09 DIAGNOSIS — J45909 Unspecified asthma, uncomplicated: Secondary | ICD-10-CM | POA: Insufficient documentation

## 2017-02-09 DIAGNOSIS — Z9889 Other specified postprocedural states: Secondary | ICD-10-CM

## 2017-02-09 DIAGNOSIS — Z87442 Personal history of urinary calculi: Secondary | ICD-10-CM | POA: Insufficient documentation

## 2017-02-09 DIAGNOSIS — J40 Bronchitis, not specified as acute or chronic: Secondary | ICD-10-CM | POA: Insufficient documentation

## 2017-02-22 NOTE — H&P (View-Only) (Signed)
Cardiology Office Note   Date:  02/25/2017   ID:  Brent, Dougherty 25-Aug-1950, MRN [id number]  PCP:  [name], MD  Cardiologist:   [name], MD   No chief complaint on file.     History of Present Illness: Brent Dougherty is a 67 y.o. male who presents for f/u  regarding new onset atrial fibrillation. Referred by Dr Andrey Campanile and [name] PA-C [facility] [place] On January 17 2017   Reviewed office note from 12/24/16 BP  Poorly controlled 162/110 mmHg. Rhythm noted when he showed up to have colonoscopy. No symptoms of palpitations, syncope, chest pain or dyspnea Lots of caffeine and mountain dew BP has not been Rx. Sedentary with poor diet Some dependant edema. OSA wears CPAP. Father had MI with premature CAD. Started on Cardizem 120 mg but no anticoagulation   This patients CHA2DS2-VASc Score and unadjusted Ischemic Stroke Rate (% per year) is equal to 2.2 % stroke rate/year from a score of 2  Above score calculated as 1 point each if present [CHF, HTN, DM, Vascular=MI/PAD/Aortic Plaque, Age if 65-74, or Male] Above score calculated as 2 points each if present [Age > 75, or Stroke/TIA/TE]   He was started on xarelto , beta blocker and diuretic   Echo reviewed 01/27/17 EF 55-60% normal atrial sizes   Labs reviewed 01/17/17   LDL 126 HDL 60 K 4.6 Cr .96 normal LFT;s Hct 44.6   Long discussion about DCC including benefits and risks CVA, pacer , intubation he would like to think about It no misses doses of xarelto   Past Medical History:  Diagnosis Date  . Arthritis    BOTH KNEES  . Asthma    uncomplicated  . Bronchitis    LAST TIME WAS JAN 2014  . Chronic insomnia 11/14/2015  . Chronic left shoulder pain 01/19/2016  . Elevated blood pressure reading   . History of kidney stones   . Morbid obesity (HCC) 11/14/2015  . New onset a-fib (HCC)   . Obstructive chronic bronchitis without exacerbation (HCC) 03/14/2008  . Osteoarthritis  of knee 11/14/2015  . Other general symptoms and signs 02/29/2008  . PONV (postoperative nausea and vomiting)   . Sleep apnea    cpap wears at times not nightly     Past Surgical History:  Procedure Laterality Date  . BILATERAL KNEE ARTHROSCOPY    . PARTIAL AMPUTATION LEFT THUMB AND TENDON REPAIR    . TONSILLECTOMY AND ADENOIDECTOMY    . TOTAL KNEE ARTHROPLASTY Right 06/19/2012   Procedure: RIGHT TOTAL KNEE ARTHROPLASTY;  Surgeon: [name], MD;  Location: [facility];  Service: Orthopedics;  Laterality: Right;     Current Outpatient Medications  Medication Sig Dispense Refill  . diltiazem (TIAZAC) 120 MG 24 hr capsule Take 120 mg by mouth daily.    . hydrochlorothiazide (MICROZIDE) 12.5 MG capsule Take 1 capsule (12.5 mg total) by mouth daily. 90 capsule 3  . metoprolol tartrate (LOPRESSOR) 25 MG tablet Take 1 tablet (25 mg total) by mouth 2 (two) times daily. 180 tablet 3  . rivaroxaban (XARELTO) 20 MG TABS tablet Take 1 tablet (20 mg total) by mouth daily with supper. 90 tablet 3  . zolpidem (AMBIEN) 10 MG tablet TAKE 1 TABLET BY MOUTH AT BEDTIME 30 tablet 5   Current Facility-Administered Medications  Medication Dose Route Frequency Provider Last Rate Last Dose  . 0.9 %  sodium chloride infusion  500 mL Intravenous Continuous Armbruster,  Willaim RayasSteven P, MD        Allergies:   Morphine and related    Social History:  The patient  reports that  has never smoked. he has never used smokeless tobacco. He reports that he does not drink alcohol or use drugs.   Family History:  The patient's family history includes Cancer in his mother; Cancer - Other in his father and paternal grandfather; Diabetes in his father and paternal grandfather; Heart disease in his father and paternal grandfather; Prostate cancer in his paternal grandfather.    ROS:  Please see the history of present illness.   Otherwise, review of systems are positive for none.   All other systems are reviewed and negative.     PHYSICAL EXAM: VS:  There were no vitals taken for this visit. , BMI There is no height or weight on file to calculate BMI. Affect appropriate Overweight white male  HEENT: normal Neck supple with no adenopathy JVP normal no bruits no thyromegaly Lungs clear with no wheezing and good diaphragmatic motion Heart:  S1/S2 no murmur, no rub, gallop or click PMI normal Abdomen: benighn, BS positve, no tenderness, no AAA no bruit.  No HSM or HJR Distal pulses intact with no bruits Plus one bilateral edema with varicose veins  Neuro non-focal Skin warm and dry Right TKR      EKG:  12/24/16 Afibr rate 105 LAD Possible old IMI Q waves 3,F 12/3/8 Afib rate 89 LAD    Recent Labs: No results found for requested labs within last 8760 hours.    Lipid Panel No results found for: CHOL, TRIG, HDL, CHOLHDL, VLDL, LDLCALC, LDLDIRECT    Wt Readings from Last 3 Encounters:  01/17/17 (!) 352 lb (159.7 kg)  12/24/16 (!) 349 lb (158.3 kg)  12/10/16 (!) 349 lb 9.6 oz (158.6 kg)      Other studies Reviewed: Additional studies/ records that were reviewed today include: [facility] ECG.    ASSESSMENT AND PLAN:  1.  Afib:  Schedule DCC when patient agrees continue rate control and anticoagulation 2. HTN Well controlled.  Continue current medications and low sodium Dash type diet.   3. Edema: from venous insuficiency and obesity with some skin breakdown and stasis Adding HCTZ Encouraged to wear compression stockings 4. Ortho: post right TKR. Larey SeatFell and needs revision with Dr [name] in February Will need to hold xarelto 2 days before surgery.  5. OSA:  Encouraged him to wear nightly and not " when I need it" Link to PAF discussed 6. Obesity:  Discussed bariatric surgery. Has lost weight in past with weight watchers.   [facility]    Current medicines are reviewed at length with the patient today.  The patient does not have concerns regarding  medicines.  The following changes have been made:     Labs/ tests ordered today include:   No orders of the defined types were placed in this encounter.    Disposition:   FU with me  [facility] or [facility]     Signed, Charlton HawsPeter Alanie Syler, MD  02/25/2017 11:29 AM    [facility] [address], [place], [place]  [address] Phone: [phone]) [phone]; Fax: [phone]) [phone]

## 2017-02-22 NOTE — Progress Notes (Signed)
Cardiology Office Note   Date:  02/25/2017   ID:  [name], [name] 25-Aug-1950, MRN [id number]  PCP:  [name], MD  Cardiologist:   [name], MD   No chief complaint on file.     History of Present Illness: Brent Dougherty is a 67 y.o. male who presents for f/u  regarding new onset atrial fibrillation. Referred by Dr Andrey Campanile and [name] PA-C [facility] [place] On January 17 2017   Reviewed office note from 12/24/16 BP  Poorly controlled 162/110 mmHg. Rhythm noted when he showed up to have colonoscopy. No symptoms of palpitations, syncope, chest pain or dyspnea Lots of caffeine and mountain dew BP has not been Rx. Sedentary with poor diet Some dependant edema. OSA wears CPAP. Father had MI with premature CAD. Started on Cardizem 120 mg but no anticoagulation   This patients CHA2DS2-VASc Score and unadjusted Ischemic Stroke Rate (% per year) is equal to 2.2 % stroke rate/year from a score of 2  Above score calculated as 1 point each if present [CHF, HTN, DM, Vascular=MI/PAD/Aortic Plaque, Age if 65-74, or Male] Above score calculated as 2 points each if present [Age > 75, or Stroke/TIA/TE]   He was started on xarelto , beta blocker and diuretic   Echo reviewed 01/27/17 EF 55-60% normal atrial sizes   Labs reviewed 01/17/17   LDL 126 HDL 60 K 4.6 Cr .96 normal LFT;s Hct 44.6   Long discussion about DCC including benefits and risks CVA, pacer , intubation he would like to think about It no misses doses of xarelto   Past Medical History:  Diagnosis Date  . Arthritis    BOTH KNEES  . Asthma    uncomplicated  . Bronchitis    LAST TIME WAS JAN 2014  . Chronic insomnia 11/14/2015  . Chronic left shoulder pain 01/19/2016  . Elevated blood pressure reading   . History of kidney stones   . Morbid obesity (HCC) 11/14/2015  . New onset a-fib (HCC)   . Obstructive chronic bronchitis without exacerbation (HCC) 03/14/2008  . Osteoarthritis  of knee 11/14/2015  . Other general symptoms and signs 02/29/2008  . PONV (postoperative nausea and vomiting)   . Sleep apnea    cpap wears at times not nightly     Past Surgical History:  Procedure Laterality Date  . BILATERAL KNEE ARTHROSCOPY    . PARTIAL AMPUTATION LEFT THUMB AND TENDON REPAIR    . TONSILLECTOMY AND ADENOIDECTOMY    . TOTAL KNEE ARTHROPLASTY Right 06/19/2012   Procedure: RIGHT TOTAL KNEE ARTHROPLASTY;  Surgeon: [name], MD;  Location: [facility];  Service: Orthopedics;  Laterality: Right;     Current Outpatient Medications  Medication Sig Dispense Refill  . diltiazem (TIAZAC) 120 MG 24 hr capsule Take 120 mg by mouth daily.    . hydrochlorothiazide (MICROZIDE) 12.5 MG capsule Take 1 capsule (12.5 mg total) by mouth daily. 90 capsule 3  . metoprolol tartrate (LOPRESSOR) 25 MG tablet Take 1 tablet (25 mg total) by mouth 2 (two) times daily. 180 tablet 3  . rivaroxaban (XARELTO) 20 MG TABS tablet Take 1 tablet (20 mg total) by mouth daily with supper. 90 tablet 3  . zolpidem (AMBIEN) 10 MG tablet TAKE 1 TABLET BY MOUTH AT BEDTIME 30 tablet 5   Current Facility-Administered Medications  Medication Dose Route Frequency Provider Last Rate Last Dose  . 0.9 %  sodium chloride infusion  500 mL Intravenous Continuous Armbruster,  Willaim RayasSteven P, MD        Allergies:   Morphine and related    Social History:  The patient  reports that  has never smoked. he has never used smokeless tobacco. He reports that he does not drink alcohol or use drugs.   Family History:  The patient's family history includes Cancer in his mother; Cancer - Other in his father and paternal grandfather; Diabetes in his father and paternal grandfather; Heart disease in his father and paternal grandfather; Prostate cancer in his paternal grandfather.    ROS:  Please see the history of present illness.   Otherwise, review of systems are positive for none.   All other systems are reviewed and negative.     PHYSICAL EXAM: VS:  There were no vitals taken for this visit. , BMI There is no height or weight on file to calculate BMI. Affect appropriate Overweight white male  HEENT: normal Neck supple with no adenopathy JVP normal no bruits no thyromegaly Lungs clear with no wheezing and good diaphragmatic motion Heart:  S1/S2 no murmur, no rub, gallop or click PMI normal Abdomen: benighn, BS positve, no tenderness, no AAA no bruit.  No HSM or HJR Distal pulses intact with no bruits Plus one bilateral edema with varicose veins  Neuro non-focal Skin warm and dry Right TKR      EKG:  12/24/16 Afibr rate 105 LAD Possible old IMI Q waves 3,F 12/3/8 Afib rate 89 LAD    Recent Labs: No results found for requested labs within last 8760 hours.    Lipid Panel No results found for: CHOL, TRIG, HDL, CHOLHDL, VLDL, LDLCALC, LDLDIRECT    Wt Readings from Last 3 Encounters:  01/17/17 (!) 352 lb (159.7 kg)  12/24/16 (!) 349 lb (158.3 kg)  12/10/16 (!) 349 lb 9.6 oz (158.6 kg)      Other studies Reviewed: Additional studies/ records that were reviewed today include: [facility] ECG.    ASSESSMENT AND PLAN:  1.  Afib:  Schedule DCC when patient agrees continue rate control and anticoagulation 2. HTN Well controlled.  Continue current medications and low sodium Dash type diet.   3. Edema: from venous insuficiency and obesity with some skin breakdown and stasis Adding HCTZ Encouraged to wear compression stockings 4. Ortho: post right TKR. Larey SeatFell and needs revision with Dr [name] in February Will need to hold xarelto 2 days before surgery.  5. OSA:  Encouraged him to wear nightly and not " when I need it" Link to PAF discussed 6. Obesity:  Discussed bariatric surgery. Has lost weight in past with weight watchers.   Will [facility] CBC[facility]PLT and [facility]    Current medicines are reviewed at length with the patient today.  The patient does not have concerns regarding  medicines.  The following changes have been made:     Labs/ tests ordered today include:   No orders of the defined types were placed in this encounter.    Disposition:   FU with me  [facility] or in [facility]     Signed, Charlton HawsPeter Samad Thon, MD  02/25/2017 11:29 AM    [facility] [address], [place], [place]  [address] Phone: [phone](336) [phone]; Fax: [phone]) [phone]

## 2017-02-25 ENCOUNTER — Ambulatory Visit (INDEPENDENT_AMBULATORY_CARE_PROVIDER_SITE_OTHER): Payer: BLUE CROSS/BLUE SHIELD | Admitting: Cardiovascular Disease

## 2017-02-25 ENCOUNTER — Encounter: Payer: Self-pay | Admitting: Cardiovascular Disease

## 2017-02-25 VITALS — BP 126/82 | HR 86 | Ht 73.0 in | Wt 330.5 lb

## 2017-02-25 DIAGNOSIS — I10 Essential (primary) hypertension: Secondary | ICD-10-CM

## 2017-02-25 DIAGNOSIS — I4891 Unspecified atrial fibrillation: Secondary | ICD-10-CM | POA: Diagnosis not present

## 2017-02-25 NOTE — Patient Instructions (Addendum)
Medication Instructions:  Your physician recommends that you continue on your current medications as directed. Please refer to the Current Medication list given to you today.  Labwork: NONE  Testing/Procedures: Your physician has recommended that you have a Cardioversion (DCCV). Electrical Cardioversion uses a jolt of electricity to your heart either through paddles or wired patches attached to your chest. This is a controlled, usually prescheduled, procedure. Defibrillation is done under light anesthesia in the hospital, and you usually go home the day of the procedure. This is done to get your heart back into a normal rhythm. You are not awake for the procedure. Please see the instruction sheet given to you today. Please give our office a call to schedule for either January 22nd and 23 rd. [phone] Ask to speak with Antonietta BreachPam Ingalls.  Follow-Up: Your physician wants you to follow-up in: 3 months with Dr. Eden EmmsNishan.   If you need a refill on your cardiac medications before your next appointment, please call your pharmacy.

## 2017-03-01 ENCOUNTER — Telehealth: Payer: Self-pay | Admitting: Cardiovascular Disease

## 2017-03-01 ENCOUNTER — Other Ambulatory Visit: Payer: Self-pay | Admitting: Cardiovascular Disease

## 2017-03-01 DIAGNOSIS — I4819 Other persistent atrial fibrillation: Secondary | ICD-10-CM

## 2017-03-01 DIAGNOSIS — I4891 Unspecified atrial fibrillation: Secondary | ICD-10-CM

## 2017-03-01 NOTE — Telephone Encounter (Signed)
Patient is calling to inform Dr. Eden EmmsNishan that he will have a cardioversion on 03/08/17. Scheduled patient with Dr. Eden EmmsNishan at 12:00 pm. Micah FlesherWent over instructions with patient. Patient will come in on Thursday for lab work. Patient verbalized understanding.

## 2017-03-01 NOTE — Telephone Encounter (Signed)
New Message   Patient is requesting a call back to discuss scheduling a cardioversion. Please call.

## 2017-03-03 ENCOUNTER — Other Ambulatory Visit: Payer: BLUE CROSS/BLUE SHIELD | Admitting: *Deleted

## 2017-03-03 DIAGNOSIS — I10 Essential (primary) hypertension: Secondary | ICD-10-CM

## 2017-03-03 DIAGNOSIS — I4891 Unspecified atrial fibrillation: Secondary | ICD-10-CM

## 2017-03-04 LAB — BASIC METABOLIC PANEL
BUN / CREAT RATIO: 15 (ref 10–24)
BUN: 14 mg/dL (ref 8–27)
CALCIUM: 8.9 mg/dL (ref 8.6–10.2)
CO2: 23 mmol/L (ref 20–29)
CREATININE: 0.95 mg/dL (ref 0.76–1.27)
Chloride: 102 mmol/L (ref 96–106)
GFR calc Af Amer: 97 mL/min/{1.73_m2} (ref >59)
GFR calc non Af Amer: 84 mL/min/{1.73_m2} (ref >59)
GLUCOSE: 82 mg/dL (ref 65–99)
Potassium: 4.1 mmol/L (ref 3.5–5.2)
Sodium: 142 mmol/L (ref 134–144)

## 2017-03-04 LAB — CBC WITH DIFFERENTIAL/PLATELET
BASOS ABS: 0 10*3/uL (ref 0.0–0.2)
Basos: 0 %
EOS (ABSOLUTE): 0 10*3/uL (ref 0.0–0.4)
Eos: 0 %
HEMOGLOBIN: 15.3 g/dL (ref 13.0–17.7)
Hematocrit: 45 % (ref 37.5–51.0)
IMMATURE GRANS (ABS): 0 10*3/uL (ref 0.0–0.1)
IMMATURE GRANULOCYTES: 0 %
LYMPHS: 40 %
Lymphocytes Absolute: 3.1 10*3/uL (ref 0.7–3.1)
MCH: 30.1 pg (ref 26.6–33.0)
MCHC: 34 g/dL (ref 31.5–35.7)
MCV: 89 fL (ref 79–97)
MONOCYTES: 8 %
Monocytes Absolute: 0.6 10*3/uL (ref 0.1–0.9)
Neutrophils Absolute: 4 10*3/uL (ref 1.4–7.0)
Neutrophils: 52 %
Platelets: 279 10*3/uL (ref 150–379)
RBC: 5.08 x10E6/uL (ref 4.14–5.80)
RDW: 13.6 % (ref 12.3–15.4)
WBC: 7.7 10*3/uL (ref 3.4–10.8)

## 2017-03-04 LAB — PROTIME-INR
INR: 1.2 (ref 0.8–1.2)
PROTHROMBIN TIME: 11.9 s (ref 9.1–12.0)

## 2017-03-08 ENCOUNTER — Ambulatory Visit (HOSPITAL_COMMUNITY)
Admission: RE | Admit: 2017-03-08 | Discharge: 2017-03-08 | Disposition: A | Discharge status: Home / Self Care | Payer: BLUE CROSS/BLUE SHIELD | Source: Ambulatory Visit | Attending: Cardiovascular Disease | Admitting: Cardiovascular Disease

## 2017-03-08 ENCOUNTER — Other Ambulatory Visit: Payer: Self-pay

## 2017-03-08 ENCOUNTER — Encounter (HOSPITAL_COMMUNITY): Payer: Self-pay | Admitting: Anesthesiology

## 2017-03-08 ENCOUNTER — Ambulatory Visit (HOSPITAL_COMMUNITY): Payer: BLUE CROSS/BLUE SHIELD | Admitting: Anesthesiology

## 2017-03-08 ENCOUNTER — Encounter (HOSPITAL_COMMUNITY)
Admission: RE | Disposition: A | Discharge status: Home / Self Care | Payer: Self-pay | Source: Ambulatory Visit | Attending: Cardiovascular Disease

## 2017-03-08 DIAGNOSIS — I998 Other disorder of circulatory system: Secondary | ICD-10-CM | POA: Insufficient documentation

## 2017-03-08 DIAGNOSIS — Z6841 Body Mass Index (BMI) 40.0 and over, adult: Secondary | ICD-10-CM | POA: Diagnosis not present

## 2017-03-08 DIAGNOSIS — I10 Essential (primary) hypertension: Secondary | ICD-10-CM | POA: Insufficient documentation

## 2017-03-08 DIAGNOSIS — G4733 Obstructive sleep apnea (adult) (pediatric): Secondary | ICD-10-CM | POA: Diagnosis not present

## 2017-03-08 DIAGNOSIS — I481 Persistent atrial fibrillation: Secondary | ICD-10-CM | POA: Diagnosis not present

## 2017-03-08 DIAGNOSIS — I4891 Unspecified atrial fibrillation: Secondary | ICD-10-CM | POA: Insufficient documentation

## 2017-03-08 DIAGNOSIS — I4819 Other persistent atrial fibrillation: Secondary | ICD-10-CM

## 2017-03-08 HISTORY — PX: CARDIOVERSION: SHX1299

## 2017-03-08 SURGERY — CARDIOVERSION
Anesthesia: General

## 2017-03-08 MED ORDER — MEPERIDINE HCL 100 MG/ML IJ SOLN: 6.2500 mg | INTRAMUSCULAR | Status: DC | PRN

## 2017-03-08 MED ORDER — LIDOCAINE 2% (20 MG/ML) 5 ML SYRINGE
INTRAMUSCULAR | Status: DC | PRN
  Administered 2017-03-08: 80 mg via INTRAVENOUS

## 2017-03-08 MED ORDER — SODIUM CHLORIDE 0.9 % IV SOLN
INTRAVENOUS | Status: AC | PRN
  Administered 2017-03-08: 500 mL via INTRAMUSCULAR
  Administered 2017-03-08: 12:00:00 via INTRAVENOUS

## 2017-03-08 MED ORDER — PROPOFOL 10 MG/ML IV BOLUS
INTRAVENOUS | Status: DC | PRN
  Administered 2017-03-08: 80 mg via INTRAVENOUS
  Administered 2017-03-08 (×2): 50 mg via INTRAVENOUS

## 2017-03-08 MED ORDER — ACETAMINOPHEN 325 MG PO TABS: 325.0000 mg | ORAL_TABLET | ORAL | Status: DC | PRN

## 2017-03-08 MED ORDER — ONDANSETRON HCL 4 MG/2ML IJ SOLN: 4.0000 mg | Freq: Once | INTRAMUSCULAR | Status: DC | PRN

## 2017-03-08 MED ORDER — ACETAMINOPHEN 160 MG/5ML PO SOLN: 325.0000 mg | ORAL | Status: DC | PRN

## 2017-03-08 MED ORDER — PROPOFOL 500 MG/50ML IV EMUL: INTRAVENOUS | Status: DC | PRN

## 2017-03-08 NOTE — Anesthesia Preprocedure Evaluation (Signed)
Anesthesia Evaluation  Patient identified by MRN, date of birth, ID band Patient awake    Reviewed: Allergy & Precautions, H&P , NPO status , Patient's Chart, lab work & pertinent test results  History of Anesthesia Complications (+) PONV and history of anesthetic complications  Airway Mallampati: II  TM Distance: >3 FB Neck ROM: Full    Dental no notable dental hx.    Pulmonary    Pulmonary exam normal breath sounds clear to auscultation       Cardiovascular negative cardio ROS Normal cardiovascular exam Rhythm:Regular Rate:Normal     Neuro/Psych negative neurological ROS  negative psych ROS   GI/Hepatic negative GI ROS, Neg liver ROS,   Endo/Other  Morbid obesity  Renal/GU negative Renal ROS  negative genitourinary   Musculoskeletal   Abdominal (+) + obese,   Peds negative pediatric ROS (+)  Hematology negative hematology ROS (+)   Anesthesia Other Findings   Reproductive/Obstetrics negative OB ROS                             Anesthesia Physical  Anesthesia Plan  ASA: III  Anesthesia Plan: General   Post-op Pain Management:    Induction:   PONV Risk Score and Plan: 3 and Ondansetron and Dexamethasone  Airway Management Planned: Mask and Natural Airway  Additional Equipment:   Intra-op Plan:   Post-operative Plan:   Informed Consent: I have reviewed the patients History and Physical, chart, labs and discussed the procedure including the risks, benefits and alternatives for the proposed anesthesia with the patient or authorized representative who has indicated his/her understanding and acceptance.   Dental advisory given  Plan Discussed with: CRNA  Anesthesia Plan Comments:         Anesthesia Quick Evaluation

## 2017-03-08 NOTE — Anesthesia Postprocedure Evaluation (Signed)
Anesthesia Post Note  Patient: Brent Dougherty  Procedure(s) Performed: CARDIOVERSION (N/A )     Patient location during evaluation: Endoscopy Anesthesia Type: General Level of consciousness: awake Pain management: pain level controlled Vital Signs Assessment: post-procedure vital signs reviewed and stable Cardiovascular status: stable Postop Assessment: no apparent nausea or vomiting Anesthetic complications: no    Last Vitals:  Vitals:   03/08/17 1150 03/08/17 1200  BP: 116/89 135/82  Pulse: 91 85  Resp: (!) 23 20  Temp: 36.7 C   SpO2: 99% 98%    Last Pain:  Vitals:   03/08/17 1150  TempSrc: Oral   Pain Goal:                 [name],JOHN Camaria Gerald

## 2017-03-08 NOTE — CV Procedure (Signed)
DCC: Anesthesia [place] Propofol and lidocaine  DCC x 4 120 J x 1 and 3x 200Joules Baseline rhythm afib rates 80-100   Failed to convert On xarelto  Will f/u in afib clinic and consider starting flecainide  Charlton HawsPeter Lamekia Nolden

## 2017-03-08 NOTE — Interval H&P Note (Signed)
History and Physical Interval Note:  03/08/2017 11:03 AM  Lia HoppingPaul W Salguero  has presented today for surgery, with the diagnosis of A-FIB  The various methods of treatment have been discussed with the patient and family. After consideration of risks, benefits and other options for treatment, the patient has consented to  Procedure(s): CARDIOVERSION (N/A) as a surgical intervention .  The patient's history has been reviewed, patient examined, no change in status, stable for surgery.  I have reviewed the patient's chart and labs.  Questions were answered to the patient's satisfaction.     Charlton HawsPeter Lachlan Mckim

## 2017-03-08 NOTE — Transfer of Care (Signed)
Immediate Anesthesia Transfer of Care Note  Patient: Brent Dougherty  Procedure(s) Performed: CARDIOVERSION (N/A )  Patient Location: Endoscopy Unit  Anesthesia Type:MAC  Level of Consciousness: awake, alert  and oriented  Airway & Oxygen Therapy: Patient Spontanous Breathing  Post-op Assessment: Report given to RN and Post -op Vital signs reviewed and stable  Post vital signs: Reviewed and stable  Last Vitals:  Vitals:   03/08/17 1015  BP: (!) 141/88  Pulse: 88  Resp: 15  Temp: 36.9 C  SpO2: 95%    Last Pain:  Vitals:   03/08/17 1015  TempSrc: Oral         Complications: No apparent anesthesia complications

## 2017-03-09 ENCOUNTER — Encounter (HOSPITAL_COMMUNITY): Payer: Self-pay | Admitting: Cardiovascular Disease

## 2017-03-15 ENCOUNTER — Telehealth: Payer: Self-pay

## 2017-03-15 NOTE — Telephone Encounter (Signed)
I have done a Xarelto PA through covermymeds. 

## 2017-05-24 ENCOUNTER — Other Ambulatory Visit: Payer: Self-pay | Admitting: Internal Medicine

## 2017-05-30 NOTE — Progress Notes (Signed)
Cardiology Office Note   Date:  06/01/2017   ID:  Brent Dougherty, DOB 03/11/1950, MRN [id number]  PCP:  [name], [name], MD  Cardiologist:   Charlton HawsPeter Hydee Fleece, MD   No chief complaint on file.     History of Present Illness:  67 y.o. first seen December 2018 for new onset afib. Had poorly controlled HTN Diet with lots of caffeine and Mountain Dew  Obese With OSA wears CPAP. CHAdVASC Score 2.   He was started on xarelto , beta blocker and diuretic   Echo reviewed 01/27/17 EF 55-60% normal atrial sizes   Labs reviewed 01/17/17   LDL 126 HDL 60 K 4.6 Cr .96 normal LFT;s Hct 44.6   Ultimately had DCC on 03/08/17 Failed to convert. Was to see afib clinic and consider starting flecainide   Discussed strategies for conversion with AAT and flecainide. He has a possible knee surgery pending and we  Discussed not doing conversion around time of surgery as this would necessitate stopping xarelto No history of CAD, normal EF and normal renal function   Past Medical History:  Diagnosis Date  . Arthritis    BOTH KNEES  . Asthma    uncomplicated  . Bronchitis    LAST TIME WAS JAN 2014  . Chronic insomnia 11/14/2015  . Chronic left shoulder pain 01/19/2016  . Elevated blood pressure reading   . History of kidney stones   . Morbid obesity (HCC) 11/14/2015  . New onset a-fib (HCC)   . Obstructive chronic bronchitis without exacerbation (HCC) 03/14/2008  . Osteoarthritis of knee 11/14/2015  . Other general symptoms and signs 02/29/2008  . PONV (postoperative nausea and vomiting)   . Sleep apnea    cpap wears at times not nightly     Past Surgical History:  Procedure Laterality Date  . BILATERAL KNEE ARTHROSCOPY    . CARDIOVERSION N/A 03/08/2017   Procedure: CARDIOVERSION;  Surgeon: Wendall StadeNishan, Trong Gosling C, MD;  Location: [facility] END[facility];  Service: Cardiovascular;  Laterality: N/A;  . PARTIAL AMPUTATION LEFT THUMB AND TENDON REPAIR    . TONSILLECTOMY AND ADENOIDECTOMY    . TOTAL KNEE  ARTHROPLASTY Right 06/19/2012   Procedure: RIGHT TOTAL KNEE ARTHROPLASTY;  Surgeon: [name], MD;  Location: [facility] ORS;  Service: Orthopedics;  Laterality: Right;     Current Outpatient Medications  Medication Sig Dispense Refill  . diltiazem (TIAZAC) 120 MG 24 hr capsule Take 120 mg by mouth daily.    . hydrochlorothiazide (MICROZIDE) 12.5 MG capsule Take 1 capsule (12.5 mg total) by mouth daily. 90 capsule 3  . Ibuprofen-Diphenhydramine Cit (ADVIL PM PO) Take 3 tablets by mouth at bedtime as needed (for sleep).    . metoprolol tartrate (LOPRESSOR) 25 MG tablet Take 1 tablet (25 mg total) by mouth 2 (two) times daily. 180 tablet 3  . rivaroxaban (XARELTO) 20 MG TABS tablet Take 1 tablet (20 mg total) by mouth daily with supper. 90 tablet 3  . zolpidem (AMBIEN) 10 MG tablet Take 1 tablet (10 mg total) by mouth at bedtime. 30 tablet 0   No current facility-administered medications for this visit.     Allergies:   Morphine and related    Social History:  The patient  reports that he has never smoked. He has never used smokeless tobacco. He reports that he does not drink alcohol or use drugs.   Family History:  The patient's family history includes Cancer in his mother; Cancer - Other in his father and  paternal grandfather; Diabetes in his father and paternal grandfather; Heart disease in his father and paternal grandfather; Prostate cancer in his paternal grandfather.    ROS:  Please see the history of present illness.   Otherwise, review of systems are positive for none.   All other systems are reviewed and negative.    PHYSICAL EXAM: VS:  BP 134/76   Pulse 81   Ht 6\' 1"  (1.854 m)   Wt (!) 327 lb (148.3 kg)   SpO2 96%   BMI 43.14 kg/m  , BMI Body mass index is 43.14 kg/m. Affect appropriate Obese male  HEENT: normal Neck supple with no adenopathy JVP normal no bruits no thyromegaly Lungs clear with no wheezing and good diaphragmatic motion Heart:  S1/S2 no murmur, no rub,  gallop or click PMI normal Abdomen: benighn, BS positve, no tenderness, no AAA no bruit.  No HSM or HJR Distal pulses intact with no bruits Plus one edema with stasis  Neuro non-focal Skin warm and dry No muscular weakness Post right TKR      EKG:  12/24/16 Afibr rate 105 LAD Possible old IMI Q waves 3,F 12/3/8 Afib rate 89 LAD  06/01/17 afib rate 61 normal otherwise QT 382   Recent Labs: 03/03/2017: BUN 14; Creatinine, Ser 0.95; Hemoglobin 15.3; Platelets 279; Potassium 4.1; Sodium 142    Lipid Panel No results found for: CHOL, TRIG, HDL, CHOLHDL, VLDL, LDLCALC, LDLDIRECT    Wt Readings from Last 3 Encounters:  06/01/17 (!) 327 lb (148.3 kg)  06/01/17 (!) 326 lb 3.2 oz (148 kg)  03/08/17 (!) 328 lb (148.8 kg)      Other studies Reviewed: Additional studies/ records that were reviewed today include: notes [facility] labs ECG.    ASSESSMENT AND PLAN:  1.  Afib:  On beta blocker and xarelto failed DCC on 03/08/17 start flecainide 75 bid ETT in 2 weeks DCC in 6 weeks or so as long as he does not schedule his elective knee surgery with Dr Despina Hick 2. HTN Well controlled.  Continue current medications and low sodium Dash type diet.   3. Edema: from venous insuficiency and obesity with some skin breakdown and stasis Continue diuretic  4. Ortho: post right TKR. Larey Seat and needs revision with Dr Despina Hick Will need to hold xarelto 2 days before surgery.  5. OSA:  Encouraged him to wear nightly and not " when I need it" Link to PAF discussed 6. Obesity:  Discussed bariatric surgery. Has lost weight in past with weight watchers.     [name]

## 2017-06-01 ENCOUNTER — Encounter: Payer: Self-pay | Admitting: Cardiovascular Disease

## 2017-06-01 ENCOUNTER — Ambulatory Visit (INDEPENDENT_AMBULATORY_CARE_PROVIDER_SITE_OTHER): Payer: BLUE CROSS/BLUE SHIELD | Admitting: Cardiovascular Disease

## 2017-06-01 ENCOUNTER — Encounter: Payer: Self-pay | Admitting: Adult Health

## 2017-06-01 ENCOUNTER — Ambulatory Visit (INDEPENDENT_AMBULATORY_CARE_PROVIDER_SITE_OTHER): Payer: BLUE CROSS/BLUE SHIELD | Admitting: Adult Health

## 2017-06-01 VITALS — BP 134/76 | HR 81 | Ht 73.0 in | Wt 327.0 lb

## 2017-06-01 DIAGNOSIS — G4700 Insomnia, unspecified: Secondary | ICD-10-CM | POA: Diagnosis not present

## 2017-06-01 DIAGNOSIS — I481 Persistent atrial fibrillation: Secondary | ICD-10-CM | POA: Diagnosis not present

## 2017-06-01 DIAGNOSIS — G4733 Obstructive sleep apnea (adult) (pediatric): Secondary | ICD-10-CM | POA: Diagnosis not present

## 2017-06-01 DIAGNOSIS — I4819 Other persistent atrial fibrillation: Secondary | ICD-10-CM

## 2017-06-01 DIAGNOSIS — I10 Essential (primary) hypertension: Secondary | ICD-10-CM

## 2017-06-01 MED ORDER — FLECAINIDE ACETATE 50 MG PO TABS: 75.0000 mg | ORAL_TABLET | Freq: Two times a day (BID) | ORAL | 11 refills | Status: DC

## 2017-06-01 MED ORDER — ZOLPIDEM TARTRATE 10 MG PO TABS: 10.0000 mg | ORAL_TABLET | Freq: Every day | ORAL | 0 refills | Status: DC

## 2017-06-01 NOTE — Assessment & Plan Note (Signed)
Wt loss  

## 2017-06-01 NOTE — Patient Instructions (Addendum)
Continue on CPAP at bedtime. CPAP download for compliance  Try to get in at least 4 hours each night. Work on healthy weight Do not drive if sleepy Follow-up in 4 months with  Dr. Maple HudsonYoung and as needed

## 2017-06-01 NOTE — Assessment & Plan Note (Addendum)
Cont on CPAP  Will check download in 1 month , If compliance is not improved, no refills on ambien . Pt aware .   Plan  Patient Instructions  Continue on CPAP at bedtime. Try to get in at least 4 hours each night. Work on healthy weight Do not drive if sleepy Follow-up in 1 year with Dr. Maple HudsonYoung and as needed

## 2017-06-01 NOTE — Addendum Note (Signed)
Addended by: Boone MasterJONES, JESSICA E on: 06/01/2017 12:44 PM   Modules accepted: Orders

## 2017-06-01 NOTE — Assessment & Plan Note (Signed)
May cont on Capacambien , pt education given  Advised on dangers of not using CPAP esp when using ambien  cpap complaince check in 1 month,  If not using will not be able to refill   Plan  Patient Instructions  Continue on CPAP at bedtime. CPAP download for compliance  Try to get in at least 4 hours each night. Work on healthy weight Do not drive if sleepy Follow-up in 4 months with  Dr. Maple HudsonYoung and as needed

## 2017-06-01 NOTE — Progress Notes (Signed)
@Patient  ID: Brent Dougherty, male    DOB: Apr 02, 1950, 68 y.o.   MRN: [id number]  Chief Complaint  Patient presents with  . Follow-up    OSA     Referring provider: [name], MD  HPI: 67 year old male followed for obstructive sleep apnea and insomnia complicated by morbid obesity  TEST  Unattended Home Sleep Test- 11/23/14- severe obstructive sleep apnea, AHI 68.3/ hr, desat to 63%, weight 344 lbs   06/01/2017 Follow up ; OSA  Returns for a follow-up for severe sleep apnea.  Patient was last seen in April 2017.  Patient is on CPAP at bedtime.  Patient says he tries to wear it each night.  He gets in about 5 hours.  Download shows good compliance at 73%.  Average usage at 5.5 hours.  Patient is on auto CPAP 4 to 20 cm H2O.  AHI 2.4.  Minimum leaks. Feels better when wearing .  We discussed improved compliance.  Pt has chronic insomnia , takes [place] each night . Advised of dangers of taking without using CPAP . Pt education given .   Allergies  Allergen Reactions  . Morphine And Related Other (See Comments)    MILD NAUSEA    Immunization History  Administered Date(s) Administered  . Influenza Split 11/14/2014, 11/15/2016  . Pneumococcal Polysaccharide-23 05/23/2008, 01/15/2017    Past Medical History:  Diagnosis Date  . Arthritis    BOTH KNEES  . Asthma    uncomplicated  . Bronchitis    LAST TIME WAS JAN 2014  . Chronic insomnia 11/14/2015  . Chronic left shoulder pain 01/19/2016  . Elevated blood pressure reading   . History of kidney stones   . Morbid obesity (HCC) 11/14/2015  . New onset a-fib (HCC)   . Obstructive chronic bronchitis without exacerbation (HCC) 03/14/2008  . Osteoarthritis of knee 11/14/2015  . Other general symptoms and signs 02/29/2008  . PONV (postoperative nausea and vomiting)   . Sleep apnea    cpap wears at times not nightly     Tobacco History: Social History   Tobacco Use  Smoking Status Never Smoker  Smokeless Tobacco  Never Used   Counseling given: Not Answered   Outpatient Encounter Medications as of 06/01/2017  Medication Sig  . diltiazem (TIAZAC) 120 MG 24 hr capsule Take 120 mg by mouth daily.  . hydrochlorothiazide (MICROZIDE) 12.5 MG capsule Take 1 capsule (12.5 mg total) by mouth daily.  . Ibuprofen-Diphenhydramine Cit (ADVIL PM PO) Take 3 tablets by mouth at bedtime as needed (for sleep).  . metoprolol tartrate (LOPRESSOR) 25 MG tablet Take 1 tablet (25 mg total) by mouth 2 (two) times daily.  . rivaroxaban (XARELTO) 20 MG TABS tablet Take 1 tablet (20 mg total) by mouth daily with supper.  . zolpidem (AMBIEN) 10 MG tablet Take 1 tablet (10 mg total) by mouth at bedtime.  . [DISCONTINUED] zolpidem (AMBIEN) 10 MG tablet TAKE 1 TABLET BY MOUTH AT BEDTIME (Patient taking differently: TAKE 10 MG BY MOUTH AT BEDTIME)   No facility-administered encounter medications on file as of 06/01/2017.      Review of Systems  Constitutional:   No  weight loss, night sweats,  Fevers, chills, fatigue, or  lassitude.  HEENT:   No headaches,  Difficulty swallowing,  Tooth/dental problems, or  Sore throat,                No sneezing, itching, ear ache, nasal congestion, post nasal drip,   CV:  No  chest pain,  Orthopnea, PND, swelling in lower extremities, anasarca, dizziness, palpitations, syncope.   GI  No heartburn, indigestion, abdominal pain, nausea, vomiting, diarrhea, change in bowel habits, loss of appetite, bloody stools.   Resp: No shortness of breath with exertion or at rest.  No excess mucus, no productive cough,  No non-productive cough,  No coughing up of blood.  No change in color of mucus.  No wheezing.  No chest wall deformity  Skin: no rash or lesions.  GU: no dysuria, change in color of urine, no urgency or frequency.  No flank pain, no hematuria   MS:  No joint pain or swelling.  No decreased range of motion.  No back pain.    Physical Exam  BP 116/76 (BP Location: Left Arm, Cuff Size:  Large)   Pulse 76   Ht 6\' 1"  (1.854 m)   Wt (!) 326 lb 3.2 oz (148 kg)   SpO2 97%   BMI 43.04 kg/m   GEN: A/Ox3; pleasant , NAD, morbid obesity    HEENT:  /AT,  EACs-clear, TMs-wnl, NOSE-clear, THROAT-clear, no lesions, no postnasal drip or exudate noted. Class 2-3 MP airway   NECK:  Supple w/ fair ROM; no JVD; normal carotid impulses w/o bruits; no thyromegaly or nodules palpated; no lymphadenopathy.    RESP  Clear  P & A; w/o, wheezes/ rales/ or rhonchi. no accessory muscle use, no dullness to percussion  CARD:  RRR, no m/r/g, no peripheral edema, pulses intact, no cyanosis or clubbing.  GI:   Soft & nt; nml bowel sounds; no organomegaly or masses detected.   Musco: Warm bil, no deformities or joint swelling noted.   Neuro: alert, no focal deficits noted.    Skin: Warm, no lesions or rashes    Lab Results:  CBC    Component Value Date/Time   WBC 7.7 03/03/2017 1531   WBC 10.8 (H) 06/21/2012 0455   RBC 5.08 03/03/2017 1531   RBC 4.25 06/21/2012 0455   HGB 15.3 03/03/2017 1531   HCT 45.0 03/03/2017 1531   PLT 279 03/03/2017 1531   MCV 89 03/03/2017 1531   MCH 30.1 03/03/2017 1531   MCH 28.7 06/21/2012 0455   MCHC 34.0 03/03/2017 1531   MCHC 33.0 06/21/2012 0455   RDW 13.6 03/03/2017 1531   LYMPHSABS 3.1 03/03/2017 1531   EOSABS 0.0 03/03/2017 1531   BASOSABS 0.0 03/03/2017 1531    BMET    Component Value Date/Time   NA 142 03/03/2017 1531   K 4.1 03/03/2017 1531   CL 102 03/03/2017 1531   CO2 23 03/03/2017 1531   GLUCOSE 82 03/03/2017 1531   GLUCOSE 114 (H) 06/21/2012 0455   BUN 14 03/03/2017 1531   CREATININE 0.95 03/03/2017 1531   CALCIUM 8.9 03/03/2017 1531   GFRNONAA 84 03/03/2017 1531   GFRAA 97 03/03/2017 1531    BNP No results found for: BNP  ProBNP No results found for: PROBNP  Imaging: No results found.   Assessment & Plan:   Obstructive sleep apnea Cont on CPAP  Will check download in 1 month , If compliance is not improved,  no refills on ambien . Pt aware .   Plan  Patient Instructions  Continue on CPAP at bedtime. Try to get in at least 4 hours each night. Work on healthy weight Do not drive if sleepy Follow-up in 1 year with Dr. Maple Hudson and as needed     Obesity, morbid (HCC) Wt loss   Insomnia May  cont on [place] , pt education given  Advised on dangers of not using CPAP esp when using ambien  cpap complaince check in 1 month,  If not using will not be able to refill   Plan  Patient Instructions  Continue on CPAP at bedtime. CPAP download for compliance  Try to get in at least 4 hours each night. Work on healthy weight Do not drive if sleepy Follow-up in 4 months with  Dr. Maple HudsonYoung and as needed        Rubye Oaksammy Parrett, NP 06/01/2017

## 2017-06-01 NOTE — Patient Instructions (Addendum)
Medication Instructions:  Your physician has recommended you make the following change in your medication:  1-START Flecainide 75 mg by mouth twice daily.  Lab work: NONE  Testing/Procedures: Your physician has requested that you have an exercise tolerance test in 2 weeks. For further information please visit [email or web address] Please also follow instruction sheet, as given.  Follow-Up: Your physician wants you to follow-up in: 6 to 8 weeks with Dr. Eden EmmsNishan to set up cardioversion.   If you need a refill on your cardiac medications before your next appointment, please call your pharmacy.

## 2017-06-02 NOTE — Addendum Note (Signed)
Addended by: Weber CooksOOPER, [place] on: 06/02/2017 11:23 AM   Modules accepted: Orders

## 2017-06-08 ENCOUNTER — Telehealth: Payer: Self-pay | Admitting: Cardiovascular Disease

## 2017-06-08 NOTE — Telephone Encounter (Signed)
[facility] Pre-operative Risk Assessment     Request for surgical clearance:   1. What type of surgery is being performed? Right knee polyethylene vs TKA revision  2. When is this surgery scheduled?  07/27/17  3. What type of clearance is required (medical clearance vs. Pharmacy clearance to hold med vs. Both)?  Both  4. Are there any medications that need to be held prior to surgery and how long? How long to hold Xarelto prior to procedure  5. Practice name and name of physician performing surgery?  Dr. Wynelle Link  6. What is your office phone number? [phone]    7.   What is your office fax number? [place]   Anesthesia type (None, local, MAC, general) ? Choice

## 2017-06-08 NOTE — Telephone Encounter (Signed)
See Dr Fabio BeringNishan's office note from 06/01/17. OK for knee surgery. Hold Xarelto 48 hours pre op.  Corine ShelterLUKE Seith Aikey PA-C 06/08/2017 4:12 PM

## 2017-06-17 ENCOUNTER — Ambulatory Visit (INDEPENDENT_AMBULATORY_CARE_PROVIDER_SITE_OTHER): Payer: BLUE CROSS/BLUE SHIELD

## 2017-06-17 ENCOUNTER — Other Ambulatory Visit: Payer: Self-pay | Admitting: Cardiovascular Disease

## 2017-06-17 DIAGNOSIS — I10 Essential (primary) hypertension: Secondary | ICD-10-CM

## 2017-06-17 DIAGNOSIS — I481 Persistent atrial fibrillation: Secondary | ICD-10-CM | POA: Diagnosis not present

## 2017-06-17 DIAGNOSIS — I4819 Other persistent atrial fibrillation: Secondary | ICD-10-CM

## 2017-06-17 DIAGNOSIS — Z5181 Encounter for therapeutic drug level monitoring: Secondary | ICD-10-CM

## 2017-06-17 DIAGNOSIS — Z79899 Other long term (current) drug therapy: Secondary | ICD-10-CM

## 2017-06-17 LAB — EXERCISE TOLERANCE TEST
CSEPED: 11 min
CSEPEW: 15 METS
CSEPHR: 82 %
Exercise duration (sec): 59 s
MPHR: 155 {beats}/min
Peak HR: 127 {beats}/min
RPE: 15
Rest HR: 66 {beats}/min

## 2017-06-27 ENCOUNTER — Other Ambulatory Visit: Payer: Self-pay | Admitting: Internal Medicine

## 2017-06-27 ENCOUNTER — Telehealth: Payer: Self-pay | Admitting: Internal Medicine

## 2017-06-27 MED ORDER — ZOLPIDEM TARTRATE 10 MG PO TABS: 10.0000 mg | ORAL_TABLET | Freq: Every day | ORAL | 5 refills | Status: DC

## 2017-06-27 NOTE — Telephone Encounter (Signed)
Spoke with patient-aware that Rx for Ambien has been called to [facility]. Nothing more needed at this time.

## 2017-06-27 NOTE — Telephone Encounter (Signed)
Ok refill total 6 months 

## 2017-06-27 NOTE — Telephone Encounter (Signed)
Pt requesting a refill of ambien.  Med last filled 06/01/17 #30 with 0 RF.  Pt last seen by TP 06/01/17.  Dr. Maple Hudson, please advise if med can be refilled for pt. Thanks!  Allergies  Allergen Reactions  . Morphine And Related Other (See Comments)    MILD NAUSEA     Current Outpatient Medications:  .  diltiazem (TIAZAC) 120 MG 24 hr capsule, Take 120 mg by mouth daily., Disp: , Rfl:  .  flecainide (TAMBOCOR) 50 MG tablet, Take 1.5 tablets (75 mg total) by mouth 2 (two) times daily., Disp: 90 tablet, Rfl: 11 .  hydrochlorothiazide (MICROZIDE) 12.5 MG capsule, Take 1 capsule (12.5 mg total) by mouth daily., Disp: 90 capsule, Rfl: 3 .  Ibuprofen-Diphenhydramine Cit (ADVIL PM PO), Take 3 tablets by mouth at bedtime as needed (for sleep)., Disp: , Rfl:  .  metoprolol tartrate (LOPRESSOR) 25 MG tablet, Take 1 tablet (25 mg total) by mouth 2 (two) times daily., Disp: 180 tablet, Rfl: 3 .  rivaroxaban (XARELTO) 20 MG TABS tablet, Take 1 tablet (20 mg total) by mouth daily with supper., Disp: 90 tablet, Rfl: 3 .  zolpidem (AMBIEN) 10 MG tablet, Take 1 tablet (10 mg total) by mouth at bedtime., Disp: 30 tablet, Rfl: 0

## 2017-07-09 ENCOUNTER — Encounter (HOSPITAL_COMMUNITY): Payer: Self-pay | Admitting: Emergency Medicine

## 2017-07-09 ENCOUNTER — Other Ambulatory Visit: Payer: Self-pay

## 2017-07-09 ENCOUNTER — Emergency Department (HOSPITAL_COMMUNITY)
Admission: EM | Admit: 2017-07-09 | Discharge: 2017-07-09 | Disposition: A | Discharge status: Home / Self Care | Payer: BLUE CROSS/BLUE SHIELD | Attending: Emergency Medicine | Admitting: Emergency Medicine

## 2017-07-09 DIAGNOSIS — Y92810 Car as the place of occurrence of the external cause: Secondary | ICD-10-CM | POA: Diagnosis not present

## 2017-07-09 DIAGNOSIS — Y999 Unspecified external cause status: Secondary | ICD-10-CM | POA: Insufficient documentation

## 2017-07-09 DIAGNOSIS — S61011A Laceration without foreign body of right thumb without damage to nail, initial encounter: Secondary | ICD-10-CM | POA: Insufficient documentation

## 2017-07-09 DIAGNOSIS — Z79899 Other long term (current) drug therapy: Secondary | ICD-10-CM | POA: Insufficient documentation

## 2017-07-09 DIAGNOSIS — Z96651 Presence of right artificial knee joint: Secondary | ICD-10-CM | POA: Diagnosis not present

## 2017-07-09 DIAGNOSIS — Y939 Activity, unspecified: Secondary | ICD-10-CM | POA: Diagnosis not present

## 2017-07-09 DIAGNOSIS — J45909 Unspecified asthma, uncomplicated: Secondary | ICD-10-CM | POA: Insufficient documentation

## 2017-07-09 DIAGNOSIS — S6991XA Unspecified injury of right wrist, hand and finger(s), initial encounter: Secondary | ICD-10-CM | POA: Diagnosis present

## 2017-07-09 DIAGNOSIS — W269XXA Contact with unspecified sharp object(s), initial encounter: Secondary | ICD-10-CM | POA: Insufficient documentation

## 2017-07-09 DIAGNOSIS — Z7901 Long term (current) use of anticoagulants: Secondary | ICD-10-CM | POA: Diagnosis not present

## 2017-07-09 DIAGNOSIS — Z23 Encounter for immunization: Secondary | ICD-10-CM | POA: Insufficient documentation

## 2017-07-09 MED ORDER — LIDOCAINE HCL (PF) 1 % IJ SOLN
INTRAMUSCULAR | Status: AC
  Administered 2017-07-09: 5 mL via INTRADERMAL
  Filled 2017-07-09: qty 5

## 2017-07-09 MED ORDER — TETANUS-DIPHTH-ACELL PERTUSSIS 5-2.5-18.5 LF-MCG/0.5 IM SUSP
0.5000 mL | Freq: Once | INTRAMUSCULAR | Status: AC
Start: 2017-07-09 — End: 2017-07-09
  Administered 2017-07-09: 0.5 mL via INTRAMUSCULAR
  Filled 2017-07-09: qty 0.5

## 2017-07-09 MED ORDER — LIDOCAINE HCL (PF) 1 % IJ SOLN
5.0000 mL | Freq: Once | INTRAMUSCULAR | Status: AC
  Administered 2017-07-09: 5 mL via INTRADERMAL

## 2017-07-09 NOTE — ED Provider Notes (Signed)
[facility] EMERGENCY DEPARTMENT Provider Note   CSN: [id number] Arrival date & time: 07/09/17  1717     History   Chief Complaint Chief Complaint  Patient presents with  . Laceration    HPI Brent Dougherty is a 67 y.o. male.  HPI   Brent Dougherty is a 67 y.o. male who presents to the Emergency Department complaining of laceration to his proximal right thumb that occurred less than 1 hour prior to arrival.  He states that he cut his thumb on an unknown object that was wedged between the seat and console of his car.  He states that he takes Xarelto daily secondary to a history of A. fib.  Bleeding was controlled prior to arrival with direct pressure.  He denies swelling, numbness, weakness or pain to the extremity.  Past Medical History:  Diagnosis Date  . Arthritis    BOTH KNEES  . Asthma    uncomplicated  . Bronchitis    LAST TIME WAS JAN 2014  . Chronic insomnia 11/14/2015  . Chronic left shoulder pain 01/19/2016  . Elevated blood pressure reading   . History of kidney stones   . Morbid obesity (HCC) 11/14/2015  . New onset a-fib (HCC)   . Obstructive chronic bronchitis without exacerbation (HCC) 03/14/2008  . Osteoarthritis of knee 11/14/2015  . Other general symptoms and signs 02/29/2008  . PONV (postoperative nausea and vomiting)   . Sleep apnea    cpap wears at times not nightly     Patient Active Problem List   Diagnosis Date Noted  . Sleep apnea   . PONV (postoperative nausea and vomiting)   . Persistent atrial fibrillation (HCC)   . History of kidney stones   . Elevated blood pressure reading   . Bronchitis   . Asthma   . Arthritis   . Chronic left shoulder pain 01/19/2016  . Osteoarthritis of knee 11/14/2015  . Morbid obesity (HCC) 11/14/2015  . Chronic insomnia 11/14/2015  . Obstructive sleep apnea 12/06/2014  . Obesity, morbid (HCC) 11/05/2014  . Insomnia 11/05/2014  . OA (osteoarthritis) of knee 06/19/2012  . Obstructive chronic bronchitis  without exacerbation (HCC) 03/14/2008  . OTHER GENERAL SYMPTOMS 02/29/2008  . Other general symptoms and signs 02/29/2008    Past Surgical History:  Procedure Laterality Date  . BILATERAL KNEE ARTHROSCOPY    . CARDIOVERSION N/A 03/08/2017   Procedure: CARDIOVERSION;  Surgeon: [name], MD;  Location: [facility] ENDOSCOPY;  Service: Cardiovascular;  Laterality: N/A;  . PARTIAL AMPUTATION LEFT THUMB AND TENDON REPAIR    . TONSILLECTOMY AND ADENOIDECTOMY    . TOTAL KNEE ARTHROPLASTY Right 06/19/2012   Procedure: RIGHT TOTAL KNEE ARTHROPLASTY;  Surgeon: [name], MD;  Location: [facility] ORS;  Service: Orthopedics;  Laterality: Right;        Home Medications    Prior to Admission medications   Medication Sig Start Date End Date Taking? Authorizing Provider  diltiazem (TIAZAC) 120 MG 24 hr capsule Take 120 mg by mouth daily.    [provider]  flecainide (TAMBOCOR) 50 MG tablet Take 1.5 tablets (75 mg total) by mouth 2 (two) times daily. 06/01/17   [name], MD  hydrochlorothiazide (MICROZIDE) 12.5 MG capsule Take 1 capsule (12.5 mg total) by mouth daily. 01/17/17 01/17/18  [name], MD  Ibuprofen-Diphenhydramine Cit (ADVIL PM PO) Take 3 tablets by mouth at bedtime as needed (for sleep).    [provider]  metoprolol tartrate (LOPRESSOR) 25 MG  tablet Take 1 tablet (25 mg total) by mouth 2 (two) times daily. 01/17/17 01/17/18  [name], MD  rivaroxaban (XARELTO) 20 MG TABS tablet Take 1 tablet (20 mg total) by mouth daily with supper. 01/17/17   [name], MD  zolpidem (AMBIEN) 10 MG tablet Take 1 tablet (10 mg total) by mouth at bedtime. 06/27/17   Waymon Budge, MD    Family History Family History  Problem Relation Age of Onset  . Heart disease Father   . Diabetes Father   . Cancer - Other Father        SKIN  . Cancer Mother        BRAIN  . Heart disease Paternal Grandfather   . Diabetes Paternal Grandfather   . Prostate cancer Paternal  Grandfather   . Cancer - Other Paternal Grandfather        SKIN  . Colon cancer Neg Hx   . Colon polyps Neg Hx   . Esophageal cancer Neg Hx   . Rectal cancer Neg Hx   . Stomach cancer Neg Hx     Social History Social History   Tobacco Use  . Smoking status: Never Smoker  . Smokeless tobacco: Never Used  Substance Use Topics  . Alcohol use: No  . Drug use: No     Allergies   Morphine and related   Review of Systems Review of Systems  Constitutional: Negative for chills and fever.  Musculoskeletal: Negative for arthralgias, back pain and joint swelling.  Skin: Positive for wound.       Laceration right thumb  Neurological: Negative for dizziness, weakness and numbness.  Hematological: Does not bruise/bleed easily.  All other systems reviewed and are negative.    Physical Exam Updated Vital Signs BP 137/81 (BP Location: Left Arm)   Pulse 74   Temp 99.6 F (37.6 C) (Oral)   Resp 18   Ht  (1.854 m)   Wt (!) 148.3 kg (327 lb)   SpO2 94%   BMI 43.14 kg/m   Physical Exam  Constitutional: He is oriented to person, place, and time. He appears well-developed and well-nourished. No distress.  HENT:  Head: Normocephalic and atraumatic.  Cardiovascular: Normal rate, regular rhythm, normal heart sounds and intact distal pulses.  No murmur heard. Pulmonary/Chest: Effort normal and breath sounds normal. No respiratory distress.  Musculoskeletal: Normal range of motion. He exhibits no edema or tenderness.       Right hand: He exhibits laceration. He exhibits normal range of motion, no tenderness, no bony tenderness and normal capillary refill. Normal sensation noted. Normal strength noted. He exhibits no finger abduction and no thumb/finger opposition.       Hands: Patient has full range of motion of the thumb.  Normal finger thumb opposition. right wrist is nontender.  Sensation intact.  Neurological: He is alert and oriented to person, place, and time. He exhibits  normal muscle tone. Coordination normal.  Skin: Skin is warm. Laceration noted.  Laceration to the proximal right thumb.  No edema.  Bleeding controlled.  No tenderness proximal to the wound site  Nursing note and vitals reviewed.    ED Treatments / Results  Labs (all labs ordered are listed, but only abnormal results are displayed) Labs Reviewed - No data to display  EKG None  Radiology No results found.  Procedures Procedures (including critical care time)  LACERATION REPAIR Performed by: Kegan Shepardson L. Authorized by: Maxwell Caul Consent: Verbal consent obtained. Risks and  benefits: risks, benefits and alternatives were discussed Consent given by: patient Patient identity confirmed: provided demographic data Prepped and Draped in normal sterile fashion Wound explored  Laceration Location: Right thumb  Laceration Length: 4 cm  No Foreign Bodies seen or palpated  Anesthesia: local infiltration  Local anesthetic: lidocaine 1 % w/o epinephrine  Anesthetic total: 3 ml  Irrigation method: syringe Amount of cleaning: standard  Skin closure: 4-0 prolene  Number of sutures: 5  Technique: simple interrupted  Patient tolerance: Patient tolerated the procedure well with no immediate complications.   Medications Ordered in ED Medications  lidocaine (PF) (XYLOCAINE) 1 % injection 5 mL (5 mLs Intradermal Given 07/09/17 1738)     Initial Impression / Assessment and Plan / ED Course  I have reviewed the triage vital signs and the nursing notes.  Pertinent labs & imaging results that were available during my care of the patient were reviewed by me and considered in my medical decision making (see chart for details).     Td up dated  here  Laceration to proximal thumb.  Bleeding controlled prior to closure neurovascularly intact.  Wound care instructions were discussed.  Patient agrees to sutures out in 8 to 10 days, return to ER for sutures removal if  needed.  Final Clinical Impressions(s) / ED Diagnoses   Final diagnoses:  Laceration of right thumb without foreign body without damage to nail, initial encounter    ED Discharge Orders    None       Pauline Aus, PA-C 07/09/17 Verdie Shire, MD 07/10/17 0030

## 2017-07-09 NOTE — ED Triage Notes (Addendum)
Pt has laceration on RT thumb from reaching in between car seats and cutting it on something sharp. Wound closed with steri strips daughter had. Bleeding controlled.

## 2017-07-09 NOTE — Discharge Instructions (Addendum)
Keep the wound clean with mild soap and water and keep it bandaged.  Sutures out in 8 to 10 days.  Return for any signs of infection such as redness, swelling, or increasing pain.  Tylenol every 4 hrs if needed for pain

## 2017-07-18 NOTE — Progress Notes (Signed)
Cardiology Office Note   Date:  07/19/2017   ID:  Brent Dougherty, DOB 10/05/1950, MRN [id number]  PCP:  [name], [name], MD  Cardiologist:   Charlton HawsPeter Nishan, MD   No chief complaint on file.     History of Present Illness:  67 y.o. first seen December 2018 for new onset afib. Had poorly controlled HTN Diet with lots of caffeine and Mountain Dew  Obese With OSA wears CPAP. CHAdVASC Score 2.   He was started on xarelto , beta blocker and diuretic   Echo reviewed 01/27/17 EF 55-60% normal atrial sizes   Labs reviewed 01/17/17   LDL 126 HDL 60 K 4.6 Cr .96 normal LFT;s Hct 44.6   Ultimately had DCC on 03/08/17 Failed to convert. Started on flecainide 06/01/17  ETT f/u normal   He is having knee surgery next Wendsday with Dr [name]  [facility]  He only has to hold his xarelto for 48 hours before surgery  For some reason ortho told him 5 days. Will have him Take last dose on Sunday     Past Medical History:  Diagnosis Date  . Arthritis    BOTH KNEES  . Asthma    uncomplicated  . Bronchitis    LAST TIME WAS JAN 2014  . Chronic insomnia 11/14/2015  . Chronic left shoulder pain 01/19/2016  . Elevated blood pressure reading   . History of kidney stones   . Morbid obesity (HCC) 11/14/2015  . New onset a-fib (HCC)   . Obstructive chronic bronchitis without exacerbation (HCC) 03/14/2008  . Osteoarthritis of knee 11/14/2015  . Other general symptoms and signs 02/29/2008  . PONV (postoperative nausea and vomiting)   . Sleep apnea    cpap wears at times not nightly     Past Surgical History:  Procedure Laterality Date  . BILATERAL KNEE ARTHROSCOPY    . CARDIOVERSION N/A 03/08/2017   Procedure: CARDIOVERSION;  Surgeon: Wendall StadeNishan, Peter C, MD;  [facility];  Service: Cardiovascular;  Laterality: N/A;  . PARTIAL AMPUTATION LEFT THUMB AND TENDON REPAIR    . TONSILLECTOMY AND ADENOIDECTOMY    . TOTAL KNEE ARTHROPLASTY Right 06/19/2012   Procedure: RIGHT TOTAL KNEE ARTHROPLASTY;  Surgeon: [name], MD;  Location: [facility] ORS;  Service: Orthopedics;  Laterality: Right;     Current Outpatient Medications  Medication Sig Dispense Refill  . diltiazem (CARDIZEM CD) 120 MG 24 hr capsule Take 120 mg by mouth daily.  5  . flecainide (TAMBOCOR) 50 MG tablet Take 1.5 tablets (75 mg total) by mouth 2 (two) times daily. 90 tablet 11  . hydrochlorothiazide (MICROZIDE) 12.5 MG capsule Take 12.5 mg by mouth 2 (two) times daily.    . Ibuprofen-Diphenhydramine Cit (ADVIL PM PO) Take 3 tablets by mouth at bedtime as needed (for sleep).    . metoprolol tartrate (LOPRESSOR) 25 MG tablet Take 1 tablet (25 mg total) by mouth 2 (two) times daily. 180 tablet 3  . rivaroxaban (XARELTO) 20 MG TABS tablet Take 1 tablet (20 mg total) by mouth daily with supper. 90 tablet 3  . zolpidem (AMBIEN) 10 MG tablet Take 1 tablet (10 mg total) by mouth at bedtime. 30 tablet 5   No current facility-administered medications for this visit.     Allergies:   Morphine and related    Social History:  The patient  reports that he has never smoked. He has never used smokeless tobacco. He reports  that he does not drink alcohol or use drugs.   Family History:  The patient's family history includes Cancer in his mother; Cancer - Other in his father and paternal grandfather; Diabetes in his father and paternal grandfather; Heart disease in his father and paternal grandfather; Prostate cancer in his paternal grandfather.    ROS:  Please see the history of present illness.   Otherwise, review of systems are positive for none.   All other systems are reviewed and negative.    PHYSICAL EXAM: VS:  BP 140/80   Pulse 70   Ht 6\' 1"  (1.854 m)   Wt (!) 332 lb 4 oz (150.7 kg)   SpO2 96%   BMI 43.84 kg/m  , BMI Body mass index is 43.84 kg/m. Affect appropriate Obese male  HEENT: normal Neck supple with no adenopathy JVP normal no bruits no thyromegaly Lungs clear  with no wheezing and good diaphragmatic motion Heart:  S1/S2 no murmur, no rub, gallop or click PMI normal Abdomen: benighn, BS positve, no tenderness, no AAA no bruit.  No HSM or HJR Distal pulses intact with no bruits Plus one edema with stasis  Neuro non-focal Skin warm and dry No muscular weakness Post right TKR with chronic plus 1-2 edema      EKG:  12/24/16 Afibr rate 105 LAD Possible old IMI Q waves 3,F 12/3/8 Afib rate 89 LAD  06/01/17 afib rate 61 normal otherwise QT 382   Recent Labs: 03/03/2017: BUN 14; Creatinine, Ser 0.95; Hemoglobin 15.3; Platelets 279; Potassium 4.1; Sodium 142    Lipid Panel No results found for: CHOL, TRIG, HDL, CHOLHDL, VLDL, LDLCALC, LDLDIRECT    Wt Readings from Last 3 Encounters:  07/19/17 (!) 332 lb 4 oz (150.7 kg)  07/09/17 (!) 327 lb (148.3 kg)  06/01/17 (!) 327 lb (148.3 kg)      Other studies Reviewed: Additional studies/ records that were reviewed today include: [facility] labs ECG.    ASSESSMENT AND PLAN:  1.  Afib:  On beta blocker and xarelto failed DCC on 03/08/17 start flecainide 75 bid ETT done 06/17/17 with no pro arrhythmia or ischemia Since he needs to hold DOAC for knee surgery will plan [facility] after surgery in 6-8 weeks Hold xarelto Sunday before surgeyr   2. HTN Well controlled.  Continue current medications and low sodium Dash type diet.   3. Edema: from venous insuficiency and obesity with some skin breakdown and stasis Continue diuretic  4. Ortho: post right TKR. Larey Seat and needs revision with Dr Despina Hick Will need to hold xarelto 2 days before surgery. Scheduled for next week  5. OSA:  Encouraged him to wear nightly and not " when I need it" Link to PAF discussed 6. Obesity:  Discussed bariatric surgery. Has lost weight in past with weight watchers.     [name]

## 2017-07-19 ENCOUNTER — Ambulatory Visit (INDEPENDENT_AMBULATORY_CARE_PROVIDER_SITE_OTHER): Payer: BLUE CROSS/BLUE SHIELD | Admitting: Cardiovascular Disease

## 2017-07-19 ENCOUNTER — Encounter: Payer: Self-pay | Admitting: Cardiovascular Disease

## 2017-07-19 VITALS — BP 140/80 | HR 70 | Ht 73.0 in | Wt 332.2 lb

## 2017-07-19 DIAGNOSIS — I480 Paroxysmal atrial fibrillation: Secondary | ICD-10-CM | POA: Diagnosis not present

## 2017-07-19 NOTE — Patient Instructions (Addendum)
Medication Instructions:  Your physician recommends that you continue on your current medications as directed. Please refer to the Current Medication list given to you today.  Labwork: NONE  Testing/Procedures: NONE  Follow-Up: Your physician wants you to follow-up in: 6 to 8 weeks with Dr. Eden EmmsNishan.   If you need a refill on your cardiac medications before your next appointment, please call your pharmacy.

## 2017-07-20 ENCOUNTER — Other Ambulatory Visit (HOSPITAL_COMMUNITY): Payer: Self-pay | Admitting: Emergency Medicine

## 2017-07-20 ENCOUNTER — Telehealth: Payer: Self-pay

## 2017-07-20 NOTE — Telephone Encounter (Signed)
Left message for Dr. Deri FuellingAluisio's office to call back. Dr. Eden EmmsNishan has directed patient to stop his Xarelto on Sunday, not 5 days prior to surgery.

## 2017-07-20 NOTE — Progress Notes (Signed)
LOV/cardiac clearance 07-19-17 epic   Stress test 06-17-17 epic   ekg 06-01-17 epic   LOV pulm Tammy Parrett 06-01-17 epic   ECHO 01-27-17 epic

## 2017-07-20 NOTE — Patient Instructions (Signed)
Brent Dougherty  07/20/2017   Your procedure is scheduled on: 07-27-17   Report to [facility] Main  Entrance    Report to admitting at 8:45AM    Call this number if you have problems the morning of surgery [phone]     Remember: Do not eat food or drink liquids :After Midnight.     Take these medicines the morning of surgery with A SIP OF WATER: diltiazem, flecainide, metoprolol   PLEASE BRING CPAP MASK AND  TUBING ONLY. DEVICE WILL BE PROVIDED!                               You may not have any metal on your body including hair pins and              piercings  Do not wear jewelry, make-up, lotions, powders or perfumes, deodorant                          Men may shave face and neck.   Do not bring valuables to the hospital. [name] IS NOT             RESPONSIBLE   FOR VALUABLES.  Contacts, dentures or bridgework may not be worn into surgery.  Leave suitcase in the car. After surgery it may be brought to your room.                 Please read over the following fact sheets you were given: _____________________________________________________________________             [name] Pre[facility] Before surgery, you can play an important role.  Because skin is not sterile, your skin needs to be as free of germs as possible.  You can reduce the number of germs on your skin by washing with CHG (chlorahexidine gluconate) soap before surgery.  CHG is an antiseptic cleaner which kills germs and bonds with the skin to continue killing germs even after washing. Please DO NOT use if you have an allergy to CHG or antibacterial soaps.  If your skin becomes reddened/irritated stop using the CHG and inform your nurse when you arrive at Short Stay. Do not shave (including legs and underarms) for at least 48 hours prior to the first CHG shower.  You may shave your face/neck. Please follow these instructions carefully:  1.  Shower with CHG Soap the  night before surgery and the  morning of Surgery.  2.  If you choose to wash your hair, wash your hair first as usual with your  normal  shampoo.  3.  After you shampoo, rinse your hair and body thoroughly to remove the  shampoo.                           4.  Use CHG as you would any other liquid soap.  You can apply chg directly  to the skin and wash                       Gently with a scrungie or clean washcloth.  5.  Apply the CHG Soap to your body ONLY FROM THE NECK DOWN.   Do not use on face/ open  Wound or open sores. Avoid contact with eyes, ears mouth and genitals (private parts).                       Wash face,  Genitals (private parts) with your normal soap.             6.  Wash thoroughly, paying special attention to the area where your surgery  will be performed.  7.  Thoroughly rinse your body with warm water from the neck down.  8.  DO NOT shower/wash with your normal soap after using and rinsing off  the CHG Soap.                9.  Pat yourself dry with a clean towel.            10.  Wear clean pajamas.            11.  Place clean sheets on your bed the night of your first shower and do not  sleep with pets. Day of Surgery : Do not apply any lotions/deodorants the morning of surgery.  Please wear clean clothes to the hospital/surgery center.  FAILURE TO FOLLOW THESE INSTRUCTIONS MAY RESULT IN THE CANCELLATION OF YOUR SURGERY PATIENT SIGNATURE_________________________________  NURSE SIGNATURE__________________________________  ________________________________________________________________________   Brent Dougherty  An incentive spirometer is a tool that can help keep your lungs clear and active. This tool measures how well you are filling your lungs with each breath. Taking long deep breaths may help reverse or decrease the chance of developing breathing (pulmonary) problems (especially infection) following:  A long period of time when you  are unable to move or be active. BEFORE THE PROCEDURE   If the spirometer includes an indicator to show your best effort, your nurse or respiratory therapist will set it to a desired goal.  If possible, sit up straight or lean slightly forward. Try not to slouch.  Hold the incentive spirometer in an upright position. INSTRUCTIONS FOR USE  1. Sit on the edge of your bed if possible, or sit up as far as you can in bed or on a chair. 2. Hold the incentive spirometer in an upright position. 3. Breathe out normally. 4. Place the mouthpiece in your mouth and seal your lips tightly around it. 5. Breathe in slowly and as deeply as possible, raising the piston or the ball toward the top of the column. 6. Hold your breath for 3-5 seconds or for as long as possible. Allow the piston or ball to fall to the bottom of the column. 7. Remove the mouthpiece from your mouth and breathe out normally. 8. Rest for a few seconds and repeat Steps 1 through 7 at least 10 times every 1-2 hours when you are awake. Take your time and take a few normal breaths between deep breaths. 9. The spirometer may include an indicator to show your best effort. Use the indicator as a goal to work toward during each repetition. 10. After each set of 10 deep breaths, practice coughing to be sure your lungs are clear. If you have an incision (the cut made at the time of surgery), support your incision when coughing by placing a pillow or rolled up towels firmly against it. Once you are able to get out of bed, walk around indoors and cough well. You may stop using the incentive spirometer when instructed by your caregiver.  RISKS AND COMPLICATIONS  Take your time so you do not get  dizzy or light-headed.  If you are in pain, you may need to take or ask for pain medication before doing incentive spirometry. It is harder to take a deep breath if you are having pain. AFTER USE  Rest and breathe slowly and easily.  It can be helpful to  keep track of a log of your progress. Your caregiver can provide you with a simple table to help with this. If you are using the spirometer at home, follow these instructions: Brent Dougherty IF:   You are having difficultly using the spirometer.  You have trouble using the spirometer as often as instructed.  Your pain medication is not giving enough relief while using the spirometer.  You develop fever of 100.5 F (38.1 C) or higher. SEEK IMMEDIATE MEDICAL CARE IF:   You cough up bloody sputum that had not been present before.  You develop fever of 102 F (38.9 C) or greater.  You develop worsening pain at or near the incision site. MAKE SURE YOU:   Understand these instructions.  Will watch your condition.  Will get help right away if you are not doing well or get worse. Document Released: 06/14/2006 Document Revised: 04/26/2011 Document Reviewed: 08/15/2006 Exit[employer] Patient Information 2014 Exit[employer], [place].   ________________________________________________________________________  WHAT IS A BLOOD TRANSFUSION? Blood Transfusion Information  A transfusion is the replacement of blood or some of its parts. Blood is made up of multiple cells which provide different functions.  Red blood cells carry oxygen and are used for blood loss replacement.  White blood cells fight against infection.  Platelets control bleeding.  Plasma helps clot blood.  Other blood products are available for specialized needs, such as hemophilia or other clotting disorders. BEFORE THE TRANSFUSION  Who gives blood for transfusions?   Healthy volunteers who are fully evaluated to make sure their blood is safe. This is blood bank blood. Transfusion therapy is the safest it has ever been in the practice of medicine. Before blood is taken from a donor, a complete history is taken to make sure that person has no history of diseases nor engages in risky social behavior (examples are intravenous drug  use or sexual activity with multiple partners). The donor's travel history is screened to minimize risk of transmitting infections, such as malaria. The donated blood is tested for signs of infectious diseases, such as HIV and hepatitis. The blood is then tested to be sure it is compatible with you in order to minimize the chance of a transfusion reaction. If you or a relative donates blood, this is often done in anticipation of surgery and is not appropriate for emergency situations. It takes many days to process the donated blood. RISKS AND COMPLICATIONS Although transfusion therapy is very safe and saves many lives, the main dangers of transfusion include:   Getting an infectious disease.  Developing a transfusion reaction. This is an allergic reaction to something in the blood you were given. Every precaution is taken to prevent this. The decision to have a blood transfusion has been considered carefully by your caregiver before blood is given. Blood is not given unless the benefits outweigh the risks. AFTER THE TRANSFUSION  Right after receiving a blood transfusion, you will usually feel much better and more energetic. This is especially true if your red blood cells have gotten low (anemic). The transfusion raises the level of the red blood cells which carry oxygen, and this usually causes an energy increase.  The nurse administering the transfusion will  monitor you carefully for complications. HOME CARE INSTRUCTIONS  No special instructions are needed after a transfusion. You may find your energy is better. Speak with your caregiver about any limitations on activity for underlying diseases you may have. SEEK MEDICAL CARE IF:   Your condition is not improving after your transfusion.  You develop redness or irritation at the intravenous (IV) site. SEEK IMMEDIATE MEDICAL CARE IF:  Any of the following symptoms occur over the next 12 hours:  Shaking chills.  You have a temperature by mouth  above 102 F (38.9 C), not controlled by medicine.  Chest, back, or muscle pain.  People around you feel you are not acting correctly or are confused.  Shortness of breath or difficulty breathing.  Dizziness and fainting.  You get a rash or develop hives.  You have a decrease in urine output.  Your urine turns a dark color or changes to pink, red, or brown. Any of the following symptoms occur over the next 10 days:  You have a temperature by mouth above 102 F (38.9 C), not controlled by medicine.  Shortness of breath.  Weakness after normal activity.  The white part of the eye turns yellow (jaundice).  You have a decrease in the amount of urine or are urinating less often.  Your urine turns a dark color or changes to pink, red, or brown. Document Released: 01/30/2000 Document Revised: 04/26/2011 Document Reviewed: 09/18/2007 [facility] Patient Information 2014 [place], [place].  _______________________________________________________________________

## 2017-07-21 ENCOUNTER — Encounter (HOSPITAL_COMMUNITY): Payer: Self-pay

## 2017-07-21 ENCOUNTER — Other Ambulatory Visit: Payer: Self-pay

## 2017-07-21 ENCOUNTER — Encounter (HOSPITAL_COMMUNITY)
Admission: RE | Admit: 2017-07-21 | Discharge: 2017-07-21 | Disposition: A | Discharge status: Home / Self Care | Payer: No Typology Code available for payment source | Source: Ambulatory Visit | Attending: Orthopedic Surgery | Admitting: Orthopedic Surgery

## 2017-07-21 DIAGNOSIS — Z01818 Encounter for other preprocedural examination: Secondary | ICD-10-CM | POA: Diagnosis not present

## 2017-07-21 DIAGNOSIS — M25361 Other instability, right knee: Secondary | ICD-10-CM | POA: Insufficient documentation

## 2017-07-21 LAB — URINALYSIS, ROUTINE W REFLEX MICROSCOPIC
Bilirubin Urine: NEGATIVE
Glucose, UA: NEGATIVE mg/dL
Hgb urine dipstick: NEGATIVE
Ketones, ur: NEGATIVE mg/dL
Leukocytes, UA: NEGATIVE
Nitrite: NEGATIVE
Protein, ur: NEGATIVE mg/dL
Specific Gravity, Urine: 1.006 (ref 1.005–1.030)
pH: 7 (ref 5.0–8.0)

## 2017-07-21 LAB — COMPREHENSIVE METABOLIC PANEL
ALT: 17 U/L (ref 17–63)
AST: 17 U/L (ref 15–41)
Albumin: 3.7 g/dL (ref 3.5–5.0)
Alkaline Phosphatase: 56 U/L (ref 38–126)
Anion gap: 7 (ref 5–15)
BUN: 18 mg/dL (ref 6–20)
CO2: 31 mmol/L (ref 22–32)
Calcium: 9.2 mg/dL (ref 8.9–10.3)
Chloride: 105 mmol/L (ref 101–111)
Creatinine, Ser: 1 mg/dL (ref 0.61–1.24)
GFR calc Af Amer: >60 mL/min (ref >60)
GFR calc non Af Amer: >60 mL/min (ref >60)
Glucose, Bld: 92 mg/dL (ref 65–99)
Potassium: 4.7 mmol/L (ref 3.5–5.1)
Sodium: 143 mmol/L (ref 135–145)
Total Bilirubin: 0.6 mg/dL (ref 0.3–1.2)
Total Protein: 6.6 g/dL (ref 6.5–8.1)

## 2017-07-21 LAB — PROTIME-INR
INR: 1.27
Prothrombin Time: 15.8 seconds — ABNORMAL HIGH (ref 11.4–15.2)

## 2017-07-21 LAB — CBC
HCT: 43 % (ref 39.0–52.0)
Hemoglobin: 14.4 g/dL (ref 13.0–17.0)
MCH: 30.6 pg (ref 26.0–34.0)
MCHC: 33.5 g/dL (ref 30.0–36.0)
MCV: 91.5 fL (ref 78.0–100.0)
Platelets: 251 10*3/uL (ref 150–400)
RBC: 4.7 MIL/uL (ref 4.22–5.81)
RDW: 12.8 % (ref 11.5–15.5)
WBC: 7 10*3/uL (ref 4.0–10.5)

## 2017-07-21 LAB — APTT: aPTT: 34 seconds (ref 24–36)

## 2017-07-21 LAB — SURGICAL PCR SCREEN
MRSA, PCR: NEGATIVE
STAPHYLOCOCCUS AUREUS: NEGATIVE

## 2017-07-23 ENCOUNTER — Encounter (HOSPITAL_COMMUNITY): Payer: Self-pay | Admitting: Emergency Medicine

## 2017-07-23 ENCOUNTER — Other Ambulatory Visit: Payer: Self-pay

## 2017-07-23 ENCOUNTER — Emergency Department (HOSPITAL_COMMUNITY)
Admission: EM | Admit: 2017-07-23 | Discharge: 2017-07-23 | Disposition: A | Discharge status: Home / Self Care | Payer: BLUE CROSS/BLUE SHIELD | Attending: Emergency Medicine | Admitting: Emergency Medicine

## 2017-07-23 DIAGNOSIS — Z4802 Encounter for removal of sutures: Secondary | ICD-10-CM | POA: Diagnosis not present

## 2017-07-23 DIAGNOSIS — Y929 Unspecified place or not applicable: Secondary | ICD-10-CM | POA: Diagnosis not present

## 2017-07-23 DIAGNOSIS — Y9389 Activity, other specified: Secondary | ICD-10-CM | POA: Diagnosis not present

## 2017-07-23 DIAGNOSIS — S61012A Laceration without foreign body of left thumb without damage to nail, initial encounter: Secondary | ICD-10-CM | POA: Diagnosis not present

## 2017-07-23 DIAGNOSIS — Y999 Unspecified external cause status: Secondary | ICD-10-CM | POA: Insufficient documentation

## 2017-07-23 DIAGNOSIS — Z79899 Other long term (current) drug therapy: Secondary | ICD-10-CM | POA: Insufficient documentation

## 2017-07-23 DIAGNOSIS — W269XXA Contact with unspecified sharp object(s), initial encounter: Secondary | ICD-10-CM | POA: Insufficient documentation

## 2017-07-23 DIAGNOSIS — Z96651 Presence of right artificial knee joint: Secondary | ICD-10-CM | POA: Insufficient documentation

## 2017-07-23 DIAGNOSIS — S61411D Laceration without foreign body of right hand, subsequent encounter: Secondary | ICD-10-CM | POA: Diagnosis not present

## 2017-07-23 DIAGNOSIS — Z7901 Long term (current) use of anticoagulants: Secondary | ICD-10-CM | POA: Insufficient documentation

## 2017-07-23 MED ORDER — LIDOCAINE-EPINEPHRINE (PF) 2 %-1:200000 IJ SOLN
10.0000 mL | Freq: Once | INTRAMUSCULAR | Status: AC
  Administered 2017-07-23: 10 mL via INTRADERMAL

## 2017-07-23 MED ORDER — LIDOCAINE-EPINEPHRINE (PF) 2 %-1:200000 IJ SOLN
INTRAMUSCULAR | Status: AC
  Administered 2017-07-23: 10 mL via INTRADERMAL
  Filled 2017-07-23: qty 20

## 2017-07-23 MED ORDER — POVIDONE-IODINE 10 % EX SOLN
CUTANEOUS | Status: AC
  Administered 2017-07-23: 21:00:00
  Filled 2017-07-23: qty 30

## 2017-07-23 NOTE — ED Provider Notes (Signed)
[facility] EMERGENCY DEPARTMENT Provider Note   CSN: [id number] Arrival date & time: 07/23/17  2028     History   Chief Complaint Chief Complaint  Patient presents with  . Laceration    HPI Brent Dougherty is a 67 y.o. male.  HPI   67 year old male presenting for evaluation of a laceration.  Patient is in the process of building a house and he is very hands-on.  He was working on a deck vent when he accidentally cut the base of his left thumb against a sharp metal edge.  Report he had onset of sharp pain which is now minimal.  No associated numbness.  No other injury.  He did try to rinse it with water and wrapped it up.  Incident happened prior to arrival.  He is up-to-date with tetanus.  He was also seen in the ED 2 weeks ago for right hand laceration and had it sutured.  His previous wound is healing and it would like sutures to be removed.  Patient is right-hand dominant.  Past Medical History:  Diagnosis Date  . Arthritis    BOTH KNEES  . Asthma    uncomplicated  . Bronchitis    LAST TIME WAS JAN 2014  . Chronic insomnia 11/14/2015  . Chronic left shoulder pain 01/19/2016  . Elevated blood pressure reading   . History of kidney stones   . Morbid obesity (HCC) 11/14/2015  . New onset a-fib (HCC)   . Obstructive chronic bronchitis without exacerbation (HCC) 03/14/2008  . Osteoarthritis of knee 11/14/2015  . Other general symptoms and signs 02/29/2008  . PONV (postoperative nausea and vomiting)   . Sleep apnea    cpap wears at times not nightly     Patient Active Problem List   Diagnosis Date Noted  . Sleep apnea   . PONV (postoperative nausea and vomiting)   . Persistent atrial fibrillation (HCC)   . History of kidney stones   . Elevated blood pressure reading   . Bronchitis   . Asthma   . Arthritis   . Chronic left shoulder pain 01/19/2016  . Osteoarthritis of knee 11/14/2015  . Morbid obesity (HCC) 11/14/2015  . Chronic insomnia 11/14/2015  .  Obstructive sleep apnea 12/06/2014  . Obesity, morbid (HCC) 11/05/2014  . Insomnia 11/05/2014  . OA (osteoarthritis) of knee 06/19/2012  . Obstructive chronic bronchitis without exacerbation (HCC) 03/14/2008  . OTHER GENERAL SYMPTOMS 02/29/2008  . Other general symptoms and signs 02/29/2008    Past Surgical History:  Procedure Laterality Date  . BILATERAL KNEE ARTHROSCOPY    . CARDIOVERSION N/A 03/08/2017   Procedure: CARDIOVERSION;  Surgeon: Wendall StadeNishan, Peter C, MD;  Location[facility] ENDOSCOPY;  Service: Cardiovascular;  Laterality: N/A;  . PARTIAL AMPUTATION LEFT THUMB AND TENDON REPAIR    . TONSILLECTOMY AND ADENOIDECTOMY    . TOTAL KNEE ARTHROPLASTY Right 06/19/2012   Procedure: RIGHT TOTAL KNEE ARTHROPLASTY;  Surgeon: [name], MD;  Location: [facility] ORS;  Service: Orthopedics;  Laterality: Right;        Home Medications    Prior to Admission medications   Medication Sig Start Date End Date Taking? Authorizing Provider  diltiazem (CARDIZEM CD) 120 MG 24 hr capsule Take 120 mg by mouth daily. 06/23/17   [provider]  flecainide (TAMBOCOR) 50 MG tablet Take 1.5 tablets (75 mg total) by mouth 2 (two) times daily. 06/01/17   Wendall StadeNishan, Peter C, MD  hydrochlorothiazide (MICROZIDE) 12.5 MG capsule Take 12.5 mg by mouth  2 (two) times daily.    [provider]  Ibuprofen-Diphenhydramine Cit (ADVIL PM PO) Take 3-4 tablets by mouth at bedtime as needed (for sleep/pain).     [provider]  metoprolol tartrate (LOPRESSOR) 25 MG tablet Take 1 tablet (25 mg total) by mouth 2 (two) times daily. 01/17/17 01/17/18  [name], MD  rivaroxaban (XARELTO) 20 MG TABS tablet Take 1 tablet (20 mg total) by mouth daily with supper. 01/17/17   [name], MD  zolpidem (AMBIEN) 10 MG tablet Take 1 tablet (10 mg total) by mouth at bedtime. 06/27/17   Waymon Budge, MD    Family History Family History  Problem Relation Age of Onset  . Heart disease Father   . Diabetes  Father   . Cancer - Other Father        SKIN  . Cancer Mother        BRAIN  . Heart disease Paternal Grandfather   . Diabetes Paternal Grandfather   . Prostate cancer Paternal Grandfather   . Cancer - Other Paternal Grandfather        SKIN  . Colon cancer Neg Hx   . Colon polyps Neg Hx   . Esophageal cancer Neg Hx   . Rectal cancer Neg Hx   . Stomach cancer Neg Hx     Social History Social History   Tobacco Use  . Smoking status: Never Smoker  . Smokeless tobacco: Never Used  Substance Use Topics  . Alcohol use: No  . Drug use: No     Allergies   Morphine and related   Review of Systems Review of Systems  Constitutional: Negative for fever.  Skin: Positive for wound.  Neurological: Negative for numbness.     Physical Exam Updated Vital Signs BP (!) 159/116 (BP Location: Right Arm)   Pulse 69   Temp 98 F (36.7 C) (Oral)   Resp 18   Ht 6\' 1"  (1.854 m)   Wt (!) 150.6 kg (332 lb)   BMI 43.80 kg/m   Physical Exam  Constitutional: He appears well-developed and well-nourished. No distress.  HENT:  Head: Atraumatic.  Eyes: Conjunctivae are normal.  Neck: Neck supple.  Musculoskeletal: He exhibits tenderness (Left thumb: 2 cm laceration noted to the base of the thumb in a vertical fashion, not actively bleeding and no foreign body noted.  Minimal tenderness to palpation.  Sensation is intact.  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  Neurological: He is alert.  Skin: No rash noted.  Psychiatric: He has a normal mood and affect.  Nursing note and vitals reviewed.    ED Treatments / Results  Labs (all labs ordered are listed, but only abnormal results are displayed) Labs Reviewed - No data to display  EKG None  Radiology No results found.  Procedures .Marland KitchenLaceration Repair Date/Time: 07/23/2017 9:15 PM Performed by: Fayrene Helper, PA-C Authorized by: Fayrene Helper, PA-C   Consent:    Consent obtained:  Verbal   Consent given by:  Patient   Risks  discussed:  Infection   Alternatives discussed:  No treatment Anesthesia (see MAR for exact dosages):    Anesthesia method:  Local infiltration   Local anesthetic:  Lidocaine 2% WITH epi Laceration details:    Location:  Finger   Finger location:  L thumb   Length (cm):  2   Depth (mm):  4 Repair type:    Repair type:  Intermediate Pre-procedure details:    Preparation:  Patient was prepped and draped in usual sterile fashion Exploration:    Hemostasis achieved with:  Direct pressure   Wound exploration: wound explored through full range of motion and entire depth of wound probed and visualized     Wound extent: vascular damage     Wound extent: no nerve damage noted and no underlying fracture noted     Contaminated: no   Treatment:    Area cleansed with:  Saline   Amount of cleaning:  Standard   Irrigation solution:  Sterile saline   Irrigation volume:  200   Irrigation method:  Pressure wash   Visualized foreign bodies/material removed: no   Skin repair:    Repair method:  Sutures (sharp surgical debridement of skin with sterile scissors)   Suture size:  5-0   Suture material:  Prolene   Suture technique:  Simple interrupted   Number of sutures:  3 Approximation:    Approximation:  Close Post-procedure details:    Dressing:  Non-adherent dressing   Patient tolerance of procedure:  Tolerated well, no immediate complications .Suture Removal Date/Time: 07/23/2017 9:17 PM Performed by: Fayrene Helper, PA-C Authorized by: Fayrene Helper, PA-C   Consent:    Consent obtained:  Verbal   Consent given by:  Patient   Risks discussed:  Wound separation   Alternatives discussed:  No treatment Location:    Location:  Upper extremity   Upper extremity location:  Hand   Hand location:  R hand Procedure details:    Wound appearance:  No signs of infection   Number of sutures removed:  5 Post-procedure details:    Post-removal:  No dressing applied   Patient tolerance of procedure:   Tolerated well, no immediate complications   (including critical care time)  Medications Ordered in ED Medications - No data to display   Initial Impression / Assessment and Plan / ED Course  I have reviewed the triage vital signs and the nursing notes.  Pertinent labs & imaging results that were available during my care of the patient were reviewed by me and considered in my medical decision making (see chart for details).     BP (!) 159/116 (BP Location: Right Arm)   Pulse 69   Temp 98 F (36.7 C) (Oral)   Resp 18   Ht 6\' 1"  (1.854 m)   Wt (!) 150.6 kg (332 lb)   BMI 43.80 kg/m    Final Clinical Impressions(s) / ED Diagnoses   Final diagnoses:  Laceration of left thumb, initial encounter    ED Discharge Orders    None     8:47 PM Patient suffered a laceration to the base of his left thumb amenable for suture repair.  No foreign body noted.  No bony nerve or muscle involvement.  He had a prior old amputation of the thumb tip.  Patient also had a prior laceration repair to his right hand on the palmar aspect overlying the thenar eminence.  Sutures in place and wound is well-appearing.  Will remove sutures.   Fayrene Helper, PA-C 07/23/17 2118    Bethann Berkshire, MD 07/23/17 2256

## 2017-07-23 NOTE — Discharge Instructions (Signed)
Please have your sutures remove in 10 days.

## 2017-07-23 NOTE — ED Notes (Signed)
Patient denies pain and is resting comfortably.  

## 2017-07-23 NOTE — ED Triage Notes (Signed)
Pt states he was working on a dryer vent and "took out a chunk of my thumb."

## 2017-07-23 NOTE — ED Notes (Signed)
Supplies in room for PA

## 2017-07-25 NOTE — H&P (Addendum)
TOTAL KNEE REVISION ADMISSION H&P  Patient is being admitted for right revision total knee arthroplasty.  Subjective:  Chief Complaint:right knee pain.  HPI: Brent Dougherty, 67 y.o. male, has a history of pain and functional disability in the right knee(s) due to trauma and failed previous arthroplasty and patient has failed non-surgical conservative treatments for greater than 12 weeks to include NSAID's and/or analgesics, flexibility and strengthening excercises, supervised PT with diminished ADL's post treatment, use of assistive devices and activity modification. The indications for the revision of the total knee arthroplasty are possible loosening of one or more components and bearing surface wear leading to symptomatic synovitis and tibiofemoral instability. Onset of symptoms was abrupt starting 1 year ago with gradually worsening course since that time.  Prior procedures on the right knee(s) include arthroplasty.  Patient currently rates pain in the right knee(s) at 8 out of 10 with activity. There is night pain, worsening of pain with activity and weight bearing, pain that interferes with activities of daily living, pain with passive range of motion, crepitus and joint swelling.  Patient has evidence of prosthetic loosening by imaging studies. This condition presents safety issues increasing the risk of falls. This patient has had trauma to right knee causing instability of the right TKA.  There is no current active infection.  Patient Active Problem List   Diagnosis Date Noted  . Sleep apnea   . PONV (postoperative nausea and vomiting)   . Persistent atrial fibrillation (HCC)   . History of kidney stones   . Elevated blood pressure reading   . Bronchitis   . Asthma   . Arthritis   . Chronic left shoulder pain 01/19/2016  . Osteoarthritis of knee 11/14/2015  . Morbid obesity (HCC) 11/14/2015  . Chronic insomnia 11/14/2015  . Obstructive sleep apnea 12/06/2014  . Obesity, morbid  (HCC) 11/05/2014  . Insomnia 11/05/2014  . OA (osteoarthritis) of knee 06/19/2012  . Obstructive chronic bronchitis without exacerbation (HCC) 03/14/2008  . OTHER GENERAL SYMPTOMS 02/29/2008  . Other general symptoms and signs 02/29/2008   Past Medical History:  Diagnosis Date  . Arthritis    BOTH KNEES  . Asthma    uncomplicated  . Bronchitis    LAST TIME WAS JAN 2014  . Chronic insomnia 11/14/2015  . Chronic left shoulder pain 01/19/2016  . Elevated blood pressure reading   . History of kidney stones   . Morbid obesity (HCC) 11/14/2015  . New onset a-fib (HCC)   . Obstructive chronic bronchitis without exacerbation (HCC) 03/14/2008  . Osteoarthritis of knee 11/14/2015  . Other general symptoms and signs 02/29/2008  . PONV (postoperative nausea and vomiting)   . Sleep apnea    cpap wears at times not nightly     Past Surgical History:  Procedure Laterality Date  . BILATERAL KNEE ARTHROSCOPY    . CARDIOVERSION N/A 03/08/2017   Procedure: CARDIOVERSION;  Surgeon: [name], MD;  Location: [facility];  Service: Cardiovascular;  Laterality: N/A;  . PARTIAL AMPUTATION LEFT THUMB AND TENDON REPAIR    . TONSILLECTOMY AND ADENOIDECTOMY    . TOTAL KNEE ARTHROPLASTY Right 06/19/2012   Procedure: RIGHT TOTAL KNEE ARTHROPLASTY;  Surgeon: [name], MD;  Location: [facility] ORS;  Service: Orthopedics;  Laterality: Right;    No current facility-administered medications for this encounter.    Current Outpatient Medications  Medication Sig Dispense Refill Last Dose  . diltiazem (CARDIZEM CD) 120 MG 24 hr capsule Take 120 mg by mouth daily.  5 Taking  . flecainide (TAMBOCOR) 50 MG tablet Take 1.5 tablets (75 mg total) by mouth 2 (two) times daily. 90 tablet 11 Taking  . hydrochlorothiazide (MICROZIDE) 12.5 MG capsule Take 12.5 mg by mouth 2 (two) times daily.   Taking  . Ibuprofen-Diphenhydramine Cit (ADVIL PM PO) Take 3-4 tablets by mouth at bedtime as needed (for sleep/pain).     Taking  . metoprolol tartrate (LOPRESSOR) 25 MG tablet Take 1 tablet (25 mg total) by mouth 2 (two) times daily. 180 tablet 3 Taking  . rivaroxaban (XARELTO) 20 MG TABS tablet Take 1 tablet (20 mg total) by mouth daily with supper. 90 tablet 3 Taking  . zolpidem (AMBIEN) 10 MG tablet Take 1 tablet (10 mg total) by mouth at bedtime. 30 tablet 5 Taking   Allergies  Allergen Reactions  . Morphine And Related Other (See Comments)    MILD NAUSEA    Social History   Tobacco Use  . Smoking status: Never Smoker  . Smokeless tobacco: Never Used  Substance Use Topics  . Alcohol use: No    Family History  Problem Relation Age of Onset  . Heart disease Father   . Diabetes Father   . Cancer - Other Father        SKIN  . Cancer Mother        BRAIN  . Heart disease Paternal Grandfather   . Diabetes Paternal Grandfather   . Prostate cancer Paternal Grandfather   . Cancer - Other Paternal Grandfather        SKIN  . Colon cancer Neg Hx   . Colon polyps Neg Hx   . Esophageal cancer Neg Hx   . Rectal cancer Neg Hx   . Stomach cancer Neg Hx       Review of Systems  Constitutional: Negative.   HENT: Negative.   Eyes: Negative.   Respiratory: Positive for shortness of breath. Negative for cough, hemoptysis, sputum production and wheezing.        SOB with exertion  Cardiovascular: Negative.   Gastrointestinal: Negative.   Genitourinary: Negative.   Musculoskeletal: Positive for joint pain and myalgias. Negative for back pain, falls and neck pain.  Skin: Negative.   Neurological: Negative.   Endo/Heme/Allergies: Negative.   Psychiatric/Behavioral: Negative.      Objective:  Physical Exam  Constitutional: He is oriented to person, place, and time. He appears well-developed. No distress.  Morbidly obese  HENT:  Head: Normocephalic and atraumatic.  Right Ear: External ear normal.  Left Ear: External ear normal.  Nose: Nose normal.  Mouth/Throat: Oropharynx is clear and moist.   Eyes: Conjunctivae and EOM are normal.  Neck: Normal range of motion. Neck supple.  Cardiovascular: Normal rate, regular rhythm, normal heart sounds and intact distal pulses.  No murmur heard. Respiratory: Effort normal and breath sounds normal. No respiratory distress. He has no wheezes.  GI: Soft. Bowel sounds are normal. He exhibits no distension. There is no tenderness.  Musculoskeletal:  Right knee exam: Nonantalgic gait and walking without assistance of a cane. No swelling, warmth, redness, or tenderness.  Well healed incisions, dry, no drainage and without tenderness. Range of motion: 0 - 130 degrees. AP laxity at 90 degrees of flexion. Varus and valgus laxity at full extension. Increased instability compared to most recent examination.  Neurological: He is alert and oriented to person, place, and time. He has normal strength. No sensory deficit.  Skin: No rash noted. He is not diaphoretic. No erythema.  Psychiatric:  He has a normal mood and affect. His behavior is normal.   Ht 6 ft 1 in  Wt 328 lbs  BMI 43.3  BP 132 / 84 sitting L arm large adult  Pulse 72 bpm regular  Imaging Review Plain radiographs demonstrate severe degenerative joint disease of the right knee(s). The overall alignment is neutral.There is no evidence of loosening of the femoral and tibial components on imaging but symptoms are consistent with possible loosening. The bone quality appears to be good for age and reported activity level.    Preoperative templating of the joint replacement has been completed, documented, and submitted to the Operating Room personnel in order to optimize intra-operative equipment management.   Assessment/Plan:  End stage primary osteoarthritis, right knee(s) with failed previous arthroplasty.   The patient history, physical examination, clinical judgment of the provider and imaging studies are consistent with end stage degenerative joint disease of the right  knee(s), previous total knee arthroplasty. Revision total knee arthroplasty is deemed medically necessary. The treatment options including medical management, injection therapy, arthroscopy and revision arthroplasty were discussed at length. The risks and benefits of revision total knee arthroplasty were presented and reviewed. The risks due to aseptic loosening, infection, stiffness, patella tracking problems, thromboembolic complications and other imponderables were discussed. The patient acknowledged the explanation, agreed to proceed with the plan and consent was signed. Patient is being admitted for inpatient treatment for surgery, pain control, PT, OT, prophylactic antibiotics, VTE prophylaxis, progressive ambulation and ADL's and discharge planning.The patient is planning to be discharged home   Therapy Plans: outpatient therapy Disposition: Home with wife Planned DVT prophylaxis: resume Xarelto 20mg  DME needed: none PCP: Dr. Andrey Campanile Cardio: Dr. Eden Emms Other: severe nausea and vomiting with anesthesia   Dimitri Ped, PA-C

## 2017-07-26 MED ORDER — BUPIVACAINE LIPOSOME 1.3 % IJ SUSP
20.0000 mL | INTRAMUSCULAR | Status: DC
  Filled 2017-07-26: qty 20

## 2017-07-26 MED ORDER — TRANEXAMIC ACID 1000 MG/10ML IV SOLN
1000.0000 mg | INTRAVENOUS | Status: AC
  Administered 2017-07-27: 1000 mg via INTRAVENOUS
  Filled 2017-07-26: qty 1100

## 2017-07-26 MED ORDER — DEXTROSE 5 % IV SOLN
3.0000 g | INTRAVENOUS | Status: AC
  Administered 2017-07-27: 3 g via INTRAVENOUS
  Filled 2017-07-26: qty 3

## 2017-07-27 ENCOUNTER — Other Ambulatory Visit: Payer: Self-pay

## 2017-07-27 ENCOUNTER — Encounter (HOSPITAL_COMMUNITY)
Admission: RE | Disposition: A | Discharge status: Home / Self Care | Payer: Self-pay | Source: Home / Self Care | Attending: Orthopedic Surgery

## 2017-07-27 ENCOUNTER — Inpatient Hospital Stay (HOSPITAL_COMMUNITY)
Admission: RE | Admit: 2017-07-27 | Discharge: 2017-07-28 | DRG: 488 | Disposition: A | Discharge status: Home / Self Care | Payer: No Typology Code available for payment source | Attending: Orthopedic Surgery | Admitting: Orthopedic Surgery

## 2017-07-27 ENCOUNTER — Inpatient Hospital Stay (HOSPITAL_COMMUNITY): Payer: No Typology Code available for payment source | Admitting: Certified Registered"

## 2017-07-27 ENCOUNTER — Encounter (HOSPITAL_COMMUNITY): Payer: Self-pay | Admitting: *Deleted

## 2017-07-27 DIAGNOSIS — Z89012 Acquired absence of left thumb: Secondary | ICD-10-CM

## 2017-07-27 DIAGNOSIS — F5104 Psychophysiologic insomnia: Secondary | ICD-10-CM | POA: Diagnosis present

## 2017-07-27 DIAGNOSIS — Z808 Family history of malignant neoplasm of other organs or systems: Secondary | ICD-10-CM

## 2017-07-27 DIAGNOSIS — Z833 Family history of diabetes mellitus: Secondary | ICD-10-CM | POA: Diagnosis not present

## 2017-07-27 DIAGNOSIS — Z9089 Acquired absence of other organs: Secondary | ICD-10-CM | POA: Diagnosis not present

## 2017-07-27 DIAGNOSIS — Z8249 Family history of ischemic heart disease and other diseases of the circulatory system: Secondary | ICD-10-CM

## 2017-07-27 DIAGNOSIS — Z96659 Presence of unspecified artificial knee joint: Secondary | ICD-10-CM

## 2017-07-27 DIAGNOSIS — Z9181 History of falling: Secondary | ICD-10-CM | POA: Diagnosis not present

## 2017-07-27 DIAGNOSIS — J449 Chronic obstructive pulmonary disease, unspecified: Secondary | ICD-10-CM | POA: Diagnosis present

## 2017-07-27 DIAGNOSIS — T84018A Broken internal joint prosthesis, other site, initial encounter: Secondary | ICD-10-CM

## 2017-07-27 DIAGNOSIS — T84022A Instability of internal right knee prosthesis, initial encounter: Principal | ICD-10-CM | POA: Diagnosis present

## 2017-07-27 DIAGNOSIS — M170 Bilateral primary osteoarthritis of knee: Secondary | ICD-10-CM | POA: Diagnosis present

## 2017-07-27 DIAGNOSIS — Z8042 Family history of malignant neoplasm of prostate: Secondary | ICD-10-CM

## 2017-07-27 DIAGNOSIS — Z79899 Other long term (current) drug therapy: Secondary | ICD-10-CM | POA: Diagnosis not present

## 2017-07-27 DIAGNOSIS — Y9289 Other specified places as the place of occurrence of the external cause: Secondary | ICD-10-CM | POA: Diagnosis not present

## 2017-07-27 DIAGNOSIS — Z7901 Long term (current) use of anticoagulants: Secondary | ICD-10-CM | POA: Diagnosis not present

## 2017-07-27 DIAGNOSIS — G4733 Obstructive sleep apnea (adult) (pediatric): Secondary | ICD-10-CM | POA: Diagnosis present

## 2017-07-27 DIAGNOSIS — G8929 Other chronic pain: Secondary | ICD-10-CM | POA: Diagnosis present

## 2017-07-27 DIAGNOSIS — M25512 Pain in left shoulder: Secondary | ICD-10-CM | POA: Diagnosis present

## 2017-07-27 DIAGNOSIS — I481 Persistent atrial fibrillation: Secondary | ICD-10-CM | POA: Diagnosis present

## 2017-07-27 DIAGNOSIS — Z6841 Body Mass Index (BMI) 40.0 and over, adult: Secondary | ICD-10-CM | POA: Diagnosis not present

## 2017-07-27 DIAGNOSIS — Y792 Prosthetic and other implants, materials and accessory orthopedic devices associated with adverse incidents: Secondary | ICD-10-CM | POA: Diagnosis present

## 2017-07-27 DIAGNOSIS — Z885 Allergy status to narcotic agent status: Secondary | ICD-10-CM

## 2017-07-27 DIAGNOSIS — Z9289 Personal history of other medical treatment: Secondary | ICD-10-CM

## 2017-07-27 HISTORY — PX: TOTAL KNEE REVISION: SHX996

## 2017-07-27 HISTORY — DX: Personal history of other medical treatment: Z92.89

## 2017-07-27 LAB — TYPE AND SCREEN
ABO/RH(D): A POS
Antibody Screen: NEGATIVE

## 2017-07-27 SURGERY — TOTAL KNEE REVISION
Anesthesia: Spinal | Site: Knee | Laterality: Right

## 2017-07-27 MED ORDER — PHENYLEPHRINE 40 MCG/ML (10ML) SYRINGE FOR IV PUSH (FOR BLOOD PRESSURE SUPPORT)
PREFILLED_SYRINGE | INTRAVENOUS | Status: DC | PRN
  Administered 2017-07-27 (×3): 80 ug via INTRAVENOUS

## 2017-07-27 MED ORDER — HYDROMORPHONE HCL 1 MG/ML IJ SOLN: 0.2500 mg | INTRAMUSCULAR | Status: DC | PRN

## 2017-07-27 MED ORDER — GABAPENTIN 300 MG PO CAPS
300.0000 mg | ORAL_CAPSULE | Freq: Once | ORAL | Status: AC
  Administered 2017-07-27: 300 mg via ORAL
  Filled 2017-07-27: qty 1

## 2017-07-27 MED ORDER — LIDOCAINE 2% (20 MG/ML) 5 ML SYRINGE
INTRAMUSCULAR | Status: AC
  Filled 2017-07-27: qty 5

## 2017-07-27 MED ORDER — CEFAZOLIN SODIUM-DEXTROSE 2-4 GM/100ML-% IV SOLN
2.0000 g | Freq: Four times a day (QID) | INTRAVENOUS | Status: AC
  Administered 2017-07-27 – 2017-07-28 (×2): 2 g via INTRAVENOUS
  Filled 2017-07-27 (×2): qty 100

## 2017-07-27 MED ORDER — PROPOFOL 10 MG/ML IV BOLUS
INTRAVENOUS | Status: AC
  Filled 2017-07-27: qty 60

## 2017-07-27 MED ORDER — HYDROCHLOROTHIAZIDE 12.5 MG PO CAPS
12.5000 mg | ORAL_CAPSULE | Freq: Two times a day (BID) | ORAL | Status: DC
  Administered 2017-07-28: 12.5 mg via ORAL
  Filled 2017-07-27: qty 1

## 2017-07-27 MED ORDER — RIVAROXABAN 10 MG PO TABS
10.0000 mg | ORAL_TABLET | Freq: Every day | ORAL | Status: DC
  Administered 2017-07-28: 10 mg via ORAL
  Filled 2017-07-27: qty 1

## 2017-07-27 MED ORDER — ONDANSETRON HCL 4 MG/2ML IJ SOLN: 4.0000 mg | Freq: Once | INTRAMUSCULAR | Status: DC | PRN

## 2017-07-27 MED ORDER — SODIUM CHLORIDE 0.9 % IJ SOLN
INTRAMUSCULAR | Status: AC
  Filled 2017-07-27: qty 10

## 2017-07-27 MED ORDER — FENTANYL CITRATE (PF) 100 MCG/2ML IJ SOLN
50.0000 ug | INTRAMUSCULAR | Status: AC
  Administered 2017-07-27: 100 ug via INTRAVENOUS
  Filled 2017-07-27: qty 2

## 2017-07-27 MED ORDER — 0.9 % SODIUM CHLORIDE (POUR BTL) OPTIME
TOPICAL | Status: DC | PRN
  Administered 2017-07-27: 1000 mL

## 2017-07-27 MED ORDER — BUPIVACAINE LIPOSOME 1.3 % IJ SUSP
INTRAMUSCULAR | Status: DC | PRN
  Administered 2017-07-27: 20 mL

## 2017-07-27 MED ORDER — ZOLPIDEM TARTRATE 5 MG PO TABS
5.0000 mg | ORAL_TABLET | Freq: Every day | ORAL | Status: DC
  Administered 2017-07-27: 5 mg via ORAL
  Filled 2017-07-27: qty 1

## 2017-07-27 MED ORDER — MENTHOL 3 MG MT LOZG: 1.0000 | LOZENGE | OROMUCOSAL | Status: DC | PRN

## 2017-07-27 MED ORDER — METOCLOPRAMIDE HCL 5 MG/ML IJ SOLN: 5.0000 mg | Freq: Three times a day (TID) | INTRAMUSCULAR | Status: DC | PRN

## 2017-07-27 MED ORDER — TRAMADOL HCL 50 MG PO TABS
50.0000 mg | ORAL_TABLET | Freq: Four times a day (QID) | ORAL | Status: DC | PRN
  Administered 2017-07-28: 50 mg via ORAL
  Filled 2017-07-27: qty 1

## 2017-07-27 MED ORDER — METOPROLOL TARTRATE 25 MG PO TABS
25.0000 mg | ORAL_TABLET | Freq: Two times a day (BID) | ORAL | Status: DC
  Administered 2017-07-27 – 2017-07-28 (×2): 25 mg via ORAL
  Filled 2017-07-27 (×2): qty 1

## 2017-07-27 MED ORDER — BISACODYL 10 MG RE SUPP: 10.0000 mg | Freq: Every day | RECTAL | Status: DC | PRN

## 2017-07-27 MED ORDER — ACETAMINOPHEN 500 MG PO TABS
1000.0000 mg | ORAL_TABLET | Freq: Four times a day (QID) | ORAL | Status: AC
  Administered 2017-07-27 – 2017-07-28 (×3): 1000 mg via ORAL
  Filled 2017-07-27 (×3): qty 2

## 2017-07-27 MED ORDER — FLECAINIDE ACETATE 50 MG PO TABS
75.0000 mg | ORAL_TABLET | Freq: Two times a day (BID) | ORAL | Status: DC
  Administered 2017-07-27 – 2017-07-28 (×2): 75 mg via ORAL
  Filled 2017-07-27 (×2): qty 2

## 2017-07-27 MED ORDER — PHENOL 1.4 % MT LIQD
1.0000 | OROMUCOSAL | Status: DC | PRN
  Filled 2017-07-27: qty 177

## 2017-07-27 MED ORDER — SODIUM CHLORIDE 0.9 % IJ SOLN
INTRAMUSCULAR | Status: AC
  Filled 2017-07-27: qty 50

## 2017-07-27 MED ORDER — SODIUM CHLORIDE 0.9 % IJ SOLN
INTRAMUSCULAR | Status: DC | PRN
  Administered 2017-07-27: 60 mL

## 2017-07-27 MED ORDER — PROPOFOL 500 MG/50ML IV EMUL
INTRAVENOUS | Status: DC | PRN
  Administered 2017-07-27: 125 ug/kg/min via INTRAVENOUS

## 2017-07-27 MED ORDER — METHOCARBAMOL 1000 MG/10ML IJ SOLN
500.0000 mg | Freq: Four times a day (QID) | INTRAVENOUS | Status: DC | PRN
  Filled 2017-07-27: qty 5

## 2017-07-27 MED ORDER — SCOPOLAMINE 1 MG/3DAYS TD PT72
1.0000 | MEDICATED_PATCH | TRANSDERMAL | Status: DC
  Administered 2017-07-27: 1.5 mg via TRANSDERMAL

## 2017-07-27 MED ORDER — METOCLOPRAMIDE HCL 5 MG PO TABS: 5.0000 mg | ORAL_TABLET | Freq: Three times a day (TID) | ORAL | Status: DC | PRN

## 2017-07-27 MED ORDER — METHOCARBAMOL 500 MG PO TABS
500.0000 mg | ORAL_TABLET | Freq: Four times a day (QID) | ORAL | Status: DC | PRN
  Administered 2017-07-27 – 2017-07-28 (×3): 500 mg via ORAL
  Filled 2017-07-27 (×3): qty 1

## 2017-07-27 MED ORDER — ONDANSETRON HCL 4 MG/2ML IJ SOLN: 4.0000 mg | Freq: Four times a day (QID) | INTRAMUSCULAR | Status: DC | PRN

## 2017-07-27 MED ORDER — DILTIAZEM HCL ER COATED BEADS 120 MG PO CP24
120.0000 mg | ORAL_CAPSULE | Freq: Every day | ORAL | Status: DC
  Administered 2017-07-28: 120 mg via ORAL
  Filled 2017-07-27: qty 1

## 2017-07-27 MED ORDER — POLYETHYLENE GLYCOL 3350 17 G PO PACK: 17.0000 g | PACK | Freq: Every day | ORAL | Status: DC | PRN

## 2017-07-27 MED ORDER — ACETAMINOPHEN 10 MG/ML IV SOLN
1000.0000 mg | Freq: Four times a day (QID) | INTRAVENOUS | Status: AC
  Administered 2017-07-27 (×2): 1000 mg via INTRAVENOUS
  Filled 2017-07-27 (×4): qty 100

## 2017-07-27 MED ORDER — SODIUM CHLORIDE 0.9 % IV SOLN
INTRAVENOUS | Status: DC
  Administered 2017-07-27: 18:00:00 via INTRAVENOUS

## 2017-07-27 MED ORDER — ONDANSETRON HCL 4 MG PO TABS: 4.0000 mg | ORAL_TABLET | Freq: Four times a day (QID) | ORAL | Status: DC | PRN

## 2017-07-27 MED ORDER — HYDROMORPHONE HCL 1 MG/ML IJ SOLN: 0.5000 mg | INTRAMUSCULAR | Status: DC | PRN

## 2017-07-27 MED ORDER — ROPIVACAINE HCL 7.5 MG/ML IJ SOLN
INTRAMUSCULAR | Status: DC | PRN
  Administered 2017-07-27: 20 mL via PERINEURAL

## 2017-07-27 MED ORDER — DIPHENHYDRAMINE HCL 12.5 MG/5ML PO ELIX
12.5000 mg | ORAL_SOLUTION | ORAL | Status: DC | PRN
  Filled 2017-07-27: qty 10

## 2017-07-27 MED ORDER — SCOPOLAMINE 1 MG/3DAYS TD PT72
MEDICATED_PATCH | TRANSDERMAL | Status: AC
  Administered 2017-07-27: 1.5 mg via TRANSDERMAL
  Filled 2017-07-27: qty 1

## 2017-07-27 MED ORDER — OXYCODONE HCL 5 MG PO TABS
5.0000 mg | ORAL_TABLET | ORAL | Status: DC | PRN
  Administered 2017-07-28 (×3): 5 mg via ORAL
  Filled 2017-07-27 (×3): qty 1

## 2017-07-27 MED ORDER — PROPOFOL 10 MG/ML IV BOLUS
INTRAVENOUS | Status: AC
Start: 2017-07-27 — End: ?
  Filled 2017-07-27: qty 20

## 2017-07-27 MED ORDER — ACETAMINOPHEN 325 MG PO TABS: 325.0000 mg | ORAL_TABLET | Freq: Four times a day (QID) | ORAL | Status: DC | PRN

## 2017-07-27 MED ORDER — MIDAZOLAM HCL 2 MG/2ML IJ SOLN
1.0000 mg | INTRAMUSCULAR | Status: AC
  Administered 2017-07-27: 2 mg via INTRAVENOUS
  Filled 2017-07-27: qty 2

## 2017-07-27 MED ORDER — FLEET ENEMA 7-19 GM/118ML RE ENEM: 1.0000 | ENEMA | Freq: Once | RECTAL | Status: DC | PRN

## 2017-07-27 MED ORDER — CHLORHEXIDINE GLUCONATE 4 % EX LIQD: 60.0000 mL | Freq: Once | CUTANEOUS | Status: DC

## 2017-07-27 MED ORDER — LACTATED RINGERS IV SOLN
INTRAVENOUS | Status: DC
  Administered 2017-07-27: 09:00:00 via INTRAVENOUS
  Administered 2017-07-27: 1000 mL via INTRAVENOUS
  Administered 2017-07-27: 12:00:00 via INTRAVENOUS

## 2017-07-27 MED ORDER — OXYCODONE HCL 5 MG PO TABS: 10.0000 mg | ORAL_TABLET | ORAL | Status: DC | PRN

## 2017-07-27 MED ORDER — DEXAMETHASONE SODIUM PHOSPHATE 10 MG/ML IJ SOLN
8.0000 mg | Freq: Once | INTRAMUSCULAR | Status: AC
  Administered 2017-07-27: 10 mg via INTRAVENOUS

## 2017-07-27 MED ORDER — ONDANSETRON HCL 4 MG/2ML IJ SOLN
INTRAMUSCULAR | Status: DC | PRN
  Administered 2017-07-27: 4 mg via INTRAVENOUS

## 2017-07-27 MED ORDER — BUPIVACAINE IN DEXTROSE 0.75-8.25 % IT SOLN
INTRATHECAL | Status: DC | PRN
  Administered 2017-07-27: 2 mL via INTRATHECAL

## 2017-07-27 MED ORDER — MEPERIDINE HCL 50 MG/ML IJ SOLN: 6.2500 mg | INTRAMUSCULAR | Status: DC | PRN

## 2017-07-27 MED ORDER — DEXAMETHASONE SODIUM PHOSPHATE 10 MG/ML IJ SOLN
10.0000 mg | Freq: Once | INTRAMUSCULAR | Status: AC
  Administered 2017-07-28: 10 mg via INTRAVENOUS
  Filled 2017-07-27: qty 1

## 2017-07-27 MED ORDER — DOCUSATE SODIUM 100 MG PO CAPS
100.0000 mg | ORAL_CAPSULE | Freq: Two times a day (BID) | ORAL | Status: DC
  Administered 2017-07-28: 100 mg via ORAL
  Filled 2017-07-27: qty 1

## 2017-07-27 SURGICAL SUPPLY — 53 items
BAG DECANTER FOR FLEXI CONT (MISCELLANEOUS) ×1 IMPLANT
BAG SPEC THK2 15X12 ZIP CLS (MISCELLANEOUS) ×2
BAG ZIPLOCK 12X15 (MISCELLANEOUS) ×4 IMPLANT
BANDAGE ACE 6X5 VEL STRL LF (GAUZE/BANDAGES/DRESSINGS) ×3 IMPLANT
BLADE SAG 18X100X1.27 (BLADE) ×3 IMPLANT
CLOSURE WOUND 1/2 X4 (GAUZE/BANDAGES/DRESSINGS) ×2
CLOTH BEACON ORANGE TIMEOUT ST (SAFETY) ×3 IMPLANT
COVER SURGICAL LIGHT HANDLE (MISCELLANEOUS) ×3 IMPLANT
CUFF TOURN SGL QUICK 34 (TOURNIQUET CUFF) ×3
CUFF TRNQT CYL 34X4X40X1 (TOURNIQUET CUFF) ×1 IMPLANT
DECANTER SPIKE VIAL GLASS SM (MISCELLANEOUS) ×4 IMPLANT
DRAPE U-SHAPE 47X51 STRL (DRAPES) ×3 IMPLANT
DRSG ADAPTIC 3X8 NADH LF (GAUZE/BANDAGES/DRESSINGS) ×3 IMPLANT
DURAPREP 26ML APPLICATOR (WOUND CARE) ×3 IMPLANT
ELECT REM PT RETURN 15FT ADLT (MISCELLANEOUS) ×3 IMPLANT
EVACUATOR 1/8 PVC DRAIN (DRAIN) ×1 IMPLANT
GAUZE SPONGE 4X4 12PLY STRL (GAUZE/BANDAGES/DRESSINGS) ×3 IMPLANT
GLOVE BIO SURGEON STRL SZ 6.5 (GLOVE) ×1 IMPLANT
GLOVE BIO SURGEON STRL SZ8 (GLOVE) ×3 IMPLANT
GLOVE BIO SURGEONS STRL SZ 6.5 (GLOVE) ×1
GLOVE BIOGEL PI IND STRL 7.0 (GLOVE) IMPLANT
GLOVE BIOGEL PI IND STRL 7.5 (GLOVE) IMPLANT
GLOVE BIOGEL PI IND STRL 8 (GLOVE) ×1 IMPLANT
GLOVE BIOGEL PI INDICATOR 7.0 (GLOVE) ×6
GLOVE BIOGEL PI INDICATOR 7.5 (GLOVE) ×6
GLOVE BIOGEL PI INDICATOR 8 (GLOVE) ×4
GLOVE SURG ORTHO 8.0 STRL STRW (GLOVE) ×2 IMPLANT
GLOVE SURG SS PI 7.5 STRL IVOR (GLOVE) ×2 IMPLANT
GOWN STRL REUS W/ TWL XL LVL3 (GOWN DISPOSABLE) IMPLANT
GOWN STRL REUS W/TWL LRG LVL3 (GOWN DISPOSABLE) ×8 IMPLANT
GOWN STRL REUS W/TWL XL LVL3 (GOWN DISPOSABLE) ×6
HANDPIECE INTERPULSE COAX TIP (DISPOSABLE) ×3
HOLDER FOLEY CATH W/STRAP (MISCELLANEOUS) ×2 IMPLANT
HOVERMATT SINGLE USE (MISCELLANEOUS) ×2 IMPLANT
IMMOBILIZER KNEE 20 (SOFTGOODS) ×3
IMMOBILIZER KNEE 20 THIGH 36 (SOFTGOODS) ×1 IMPLANT
MANIFOLD NEPTUNE II (INSTRUMENTS) ×3 IMPLANT
PACK TOTAL KNEE CUSTOM (KITS) ×3 IMPLANT
PAD ABD 8X10 STRL (GAUZE/BANDAGES/DRESSINGS) ×2 IMPLANT
PADDING CAST COTTON 6X4 STRL (CAST SUPPLIES) ×8 IMPLANT
PLATE ROT INSERT 15MM SIZE 5 (Plate) ×2 IMPLANT
POSITIONER SURGICAL ARM (MISCELLANEOUS) ×3 IMPLANT
SET HNDPC FAN SPRY TIP SCT (DISPOSABLE) ×1 IMPLANT
STRIP CLOSURE SKIN 1/2X4 (GAUZE/BANDAGES/DRESSINGS) ×2 IMPLANT
SUT MNCRL AB 4-0 PS2 18 (SUTURE) ×2 IMPLANT
SUT PDS AB 1 CT1 27 (SUTURE) ×2 IMPLANT
SUT STRATAFIX 0 PDS 27 VIOLET (SUTURE) ×3
SUT VIC AB 2-0 CT1 27 (SUTURE) ×12
SUT VIC AB 2-0 CT1 TAPERPNT 27 (SUTURE) ×3 IMPLANT
SUTURE STRATFX 0 PDS 27 VIOLET (SUTURE) ×1 IMPLANT
SYR 50ML LL SCALE MARK (SYRINGE) ×4 IMPLANT
TRAY FOLEY MTR SLVR 16FR STAT (SET/KITS/TRAYS/PACK) ×3 IMPLANT
WRAP KNEE MAXI GEL POST OP (GAUZE/BANDAGES/DRESSINGS) ×2 IMPLANT

## 2017-07-27 NOTE — Evaluation (Signed)
Physical Therapy Evaluation Patient Details Name: Brent Dougherty W Hiltunen MRN: [id number] DOB: 01/11/1951 Today's Date: 07/27/2017   History of Present Illness  Pt s/p R TKR revision  Clinical Impression  Pt s/p R TKR revision and presents with functional mobility limitations 2* decreased R LE strength/ROM and post op pain.  Pt should progress to dc home with family assist.    Follow Up Recommendations Follow surgeon's recommendation for DC plan and follow-up therapies    Equipment Recommendations  None recommended by PT    Recommendations for Other Services       Precautions / Restrictions Precautions Precautions: Knee;Fall Required Braces or Orthoses: Knee Immobilizer - Right Knee Immobilizer - Right: Discontinue once straight leg raise with < 10 degree lag Restrictions Weight Bearing Restrictions: No Other Position/Activity Restrictions: WBAT      Mobility  Bed Mobility Overal bed mobility: Needs Assistance Bed Mobility: Supine to Sit     Supine to sit: Min guard     General bed mobility comments: min cues for sequence  Transfers Overall transfer level: Needs assistance Equipment used: Rolling walker (2 wheeled) Transfers: Sit to/from Stand Sit to Stand: Min guard         General transfer comment: min cues for LE management and use of UEs to self assist  Ambulation/Gait Ambulation/Gait assistance: Min guard Ambulation Distance (Feet): 200 Feet Assistive device: Rolling walker (2 wheeled) Gait Pattern/deviations: Step-to pattern;Step-through pattern;Shuffle;Trunk flexed Gait velocity: decr   General Gait Details: cues for posture, position from RW and initial sequence  Stairs            Wheelchair Mobility    Modified Rankin (Stroke Patients Only)       Balance Overall balance assessment: No apparent balance deficits (not formally assessed)                                           Pertinent Vitals/Pain Pain Assessment:  0-10 Pain Score: 2  Pain Location: R knee Pain Descriptors / Indicators: Aching;Sore Pain Intervention(s): Limited activity within patient's tolerance;Monitored during session;Premedicated before session;Ice applied    Home Living Family/patient expects to be discharged to:: Private residence Living Arrangements: Spouse/significant other Available Help at Discharge: Family Type of Home: House Home Access: Stairs to enter Entrance Stairs-Rails: Doctor, general practiceight;Left Entrance Stairs-Number of Steps: 4 Home Layout: One level Home Equipment: Environmental consultantWalker - 2 wheels;Cane - single point;Bedside commode      Prior Function Level of Independence: Independent;Independent with assistive device(s)         Comments: cane as needed     Hand Dominance        Extremity/Trunk Assessment   Upper Extremity Assessment Upper Extremity Assessment: Overall WFL for tasks assessed    Lower Extremity Assessment Lower Extremity Assessment: RLE deficits/detail    Cervical / Trunk Assessment Cervical / Trunk Assessment: Normal  Communication   Communication: No difficulties  Cognition Arousal/Alertness: Awake/alert Behavior During Therapy: WFL for tasks assessed/performed Overall Cognitive Status: Within Functional Limits for tasks assessed                                        General Comments      Exercises     Assessment/Plan    PT Assessment Patient needs continued PT services  PT Problem List Decreased strength;Decreased  range of motion;Decreased activity tolerance;Decreased mobility;Decreased knowledge of use of DME;Pain;Obesity       PT Treatment Interventions DME instruction;Gait training;Stair training;Functional mobility training;Therapeutic activities;Therapeutic exercise;Patient/family education    PT Goals (Current goals can be found in the Care Plan section)  Acute Rehab PT Goals Patient Stated Goal: Walk without a limp PT Goal Formulation: With patient Time  For Goal Achievement: 08/03/17 Potential to Achieve Goals: Good    Frequency 7X/week   Barriers to discharge        Co-evaluation               AM-PAC PT "6 Clicks" Daily Activity  Outcome Measure Difficulty turning over in bed (including adjusting bedclothes, sheets and blankets)?: A Lot Difficulty moving from lying on back to sitting on the side of the bed? : A Lot Difficulty sitting down on and standing up from a chair with arms (e.g., wheelchair, bedside commode, etc,.)?: A Lot Help needed moving to and from a bed to chair (including a wheelchair)?: A Little Help needed walking in hospital room?: A Little Help needed climbing 3-5 steps with a railing? : A Little 6 Click Score: 15    End of Session Equipment Utilized During Treatment: Right knee immobilizer Activity Tolerance: Patient tolerated treatment well Patient left: in chair;with call bell/phone within reach;with family/visitor present Nurse Communication: Mobility status PT Visit Diagnosis: Difficulty in walking, not elsewhere classified (R26.2)    Time: 4782-9562 PT Time Calculation (min) (ACUTE ONLY): 23 min   Charges:   PT Evaluation $PT Eval Low Complexity: 1 Low     PT G Codes:        Pg [phone]   [name] 07/27/2017, 5:44 PM

## 2017-07-27 NOTE — Anesthesia Procedure Notes (Signed)
Anesthesia Regional Block: Adductor canal block   Pre-Anesthetic Checklist: ,, timeout performed, Correct Patient, Correct Site, Correct Laterality, Correct Procedure, Correct Position, site marked, Risks and benefits discussed,  Surgical consent,  Pre-op evaluation,  At surgeon's request and post-op pain management  Laterality: Right  Prep: chloraprep       Needles:  Injection technique: Single-shot  Needle Type: Echogenic Stimulator Needle     Needle Length: 9cm  Needle Gauge: 21     Additional Needles:   Narrative:  Start time: 07/27/2017 10:25 AM End time: 07/27/2017 10:35 AM Injection made incrementally with aspirations every 5 mL.  Performed by: Personally  Anesthesiologist: Arta Brucessey, Naira Standiford, MD  Additional Notes: Monitors applied. Patient sedated. Sterile prep and drape,hand hygiene and sterile gloves were used. Relevant anatomy identified.Needle position confirmed.Local anesthetic injected incrementally after negative aspiration. Local anesthetic spread visualized around nerve(s). Vascular puncture avoided. No complications. Image printed for medical record.The patient tolerated the procedure well.    Arta BruceKevin Sarahelizabeth Conway MD

## 2017-07-27 NOTE — Anesthesia Preprocedure Evaluation (Signed)
Anesthesia Evaluation  Patient identified by MRN, date of birth, ID band Patient awake    Reviewed: Allergy & Precautions, NPO status , Patient's Chart, lab work & pertinent test results  History of Anesthesia Complications (+) PONV  Airway Mallampati: I  TM Distance: >3 FB Neck ROM: Full    Dental   Pulmonary sleep apnea , COPD,    Pulmonary exam normal        Cardiovascular Normal cardiovascular exam     Neuro/Psych    GI/Hepatic   Endo/Other    Renal/GU      Musculoskeletal   Abdominal   Peds  Hematology   Anesthesia Other Findings   Reproductive/Obstetrics                             Anesthesia Physical Anesthesia Plan  ASA: III  Anesthesia Plan: Spinal   Post-op Pain Management:  Regional for Post-op pain   Induction: Intravenous  PONV Risk Score and Plan: 2 and Ondansetron  Airway Management Planned: Simple Face Mask  Additional Equipment:   Intra-op Plan:   Post-operative Plan:   Informed Consent: I have reviewed the patients History and Physical, chart, labs and discussed the procedure including the risks, benefits and alternatives for the proposed anesthesia with the patient or authorized representative who has indicated his/her understanding and acceptance.     Plan Discussed with: CRNA and Surgeon  Anesthesia Plan Comments:         Anesthesia Quick Evaluation

## 2017-07-27 NOTE — Transfer of Care (Signed)
Immediate Anesthesia Transfer of Care Note  Patient: Brent Dougherty  Procedure(s) Performed: RIGHT KNEE POLYETHYLENE REVISION (Right Knee)  Patient Location: PACU  Anesthesia Type:Regional and Spinal  Level of Consciousness: awake, alert  and oriented  Airway & Oxygen Therapy: Patient Spontanous Breathing and Patient connected to nasal cannula oxygen  Post-op Assessment: Report given to RN and Post -op Vital signs reviewed and stable  Post vital signs: Reviewed and stable  Last Vitals:  Vitals Value Taken Time  BP    Temp    Pulse 75 07/27/2017  1:07 PM  Resp 10 07/27/2017  1:07 PM  SpO2 100 % 07/27/2017  1:07 PM  Vitals shown include unvalidated device data.  Last Pain:  Vitals:   07/27/17 0908  TempSrc: Oral         Complications: No apparent anesthesia complications

## 2017-07-27 NOTE — Anesthesia Procedure Notes (Signed)
Spinal  Patient location during procedure: OR Start time: 07/27/2017 11:20 AM End time: 07/27/2017 11:23 AM Staffing Anesthesiologist: Arta Brucessey, Adriel Desrosier, MD Performed: anesthesiologist  Preanesthetic Checklist Completed: patient identified, surgical consent, pre-op evaluation, timeout performed, IV checked, risks and benefits discussed and monitors and equipment checked Spinal Block Patient position: sitting Prep: DuraPrep Patient monitoring: blood pressure, continuous pulse ox, cardiac monitor and heart rate Approach: midline Location: L3-4 Injection technique: single-shot Needle Needle type: Whitacre  Needle gauge: 24 G Needle length: 9 cm Needle insertion depth: 9 cm

## 2017-07-27 NOTE — Interval H&P Note (Signed)
History and Physical Interval Note:  07/27/2017 9:57 AM  Brent HoppingPaul W Trautmann  has presented today for surgery, with the diagnosis of Unstbale Right Total Knee Arthroplasty  The various methods of treatment have been discussed with the patient and family. After consideration of risks, benefits and other options for treatment, the patient has consented to  Procedure(s): RIGHT KNEE POLYETHYLENE VERSUS RIGHT TOTAL KNEE ARTHROPLASTY REVISION (Right) as a surgical intervention .  The patient's history has been reviewed, patient examined, no change in status, stable for surgery.  I have reviewed the patient's chart and labs.  Questions were answered to the patient's satisfaction.     Homero FellersFrank Sloane Junkin

## 2017-07-27 NOTE — Anesthesia Postprocedure Evaluation (Signed)
Anesthesia Post Note  Patient: Lia Hoppingaul W Essman  Procedure(s) Performed: RIGHT KNEE POLYETHYLENE REVISION (Right Knee)     Patient location during evaluation: PACU Anesthesia Type: Spinal Level of consciousness: oriented and awake and alert Pain management: pain level controlled Vital Signs Assessment: post-procedure vital signs reviewed and stable Respiratory status: spontaneous breathing, respiratory function stable and patient connected to nasal cannula oxygen Cardiovascular status: blood pressure returned to baseline and stable Postop Assessment: no headache, no backache and no apparent nausea or vomiting Anesthetic complications: no    Last Vitals:  Vitals:   07/27/17 1624 07/27/17 1742  BP: 134/86 (!) 147/99  Pulse: (!) 54 75  Resp: 17 17  Temp: 36.5 C 36.9 C  SpO2: 100% 96%    Last Pain:  Vitals:   07/27/17 1835  TempSrc:   PainSc: 3                  Maricia Scotti DAVID

## 2017-07-27 NOTE — Progress Notes (Signed)
AssistedDr. Ossey with right, ultrasound guided, adductor canal block. Side rails up, monitors on throughout procedure. See vital signs in flow sheet. Tolerated Procedure well.  

## 2017-07-27 NOTE — Brief Op Note (Signed)
07/27/2017  2:01 PM  PATIENT:  Brent Dougherty  67 y.o. male  PRE-OPERATIVE DIAGNOSIS:  Unstable Right Total Knee Arthroplasty  POST-OPERATIVE DIAGNOSIS:  Unstable Right Total Knee Arthroplasty  PROCEDURE:  Procedure(s) with comments: RIGHT KNEE POLYETHYLENE REVISION (Right) - Adductor Block  SURGEON:  Surgeon(s) and Role:    [name]* Madeleine Fenn, MD - Primary  PHYSICIAN ASSISTANT:   ASSISTANTS: Leilani AbleSteve Chabon, PA-C   ANESTHESIA:   spinal  EBL:  50 mL   BLOOD ADMINISTERED:none  DRAINS: none   LOCAL MEDICATIONS USED:  OTHER Exparel  COUNTS:  YES  TOURNIQUET:   Total Tourniquet Time Documented: Thigh (Right) - 33 minutes Total: Thigh (Right) - 33 minutes   DICTATION: .Other Dictation: Dictation Number [id number]  PLAN OF CARE: Admit to inpatient   PATIENT DISPOSITION:  PACU - hemodynamically stable.

## 2017-07-27 NOTE — Progress Notes (Signed)
Patient bleeding at the back of the knee a large amount of blood. Bled through 2 ace wraps,TED's,SCD onto KI Reinforced with 2ABD pads and ace wrap and Dr.Aluisio aware. No new orders. Will continue to monitor.

## 2017-07-28 ENCOUNTER — Encounter (HOSPITAL_COMMUNITY): Payer: Self-pay | Admitting: Orthopedic Surgery

## 2017-07-28 LAB — CBC
HCT: 39.7 % (ref 39.0–52.0)
Hemoglobin: 13.5 g/dL (ref 13.0–17.0)
MCH: 30.5 pg (ref 26.0–34.0)
MCHC: 34 g/dL (ref 30.0–36.0)
MCV: 89.8 fL (ref 78.0–100.0)
Platelets: 243 K/uL (ref 150–400)
RBC: 4.42 MIL/uL (ref 4.22–5.81)
RDW: 12.7 % (ref 11.5–15.5)
WBC: 12.2 K/uL — ABNORMAL HIGH (ref 4.0–10.5)

## 2017-07-28 LAB — BASIC METABOLIC PANEL WITH GFR
Anion gap: 5 (ref 5–15)
BUN: 13 mg/dL (ref 6–20)
CO2: 28 mmol/L (ref 22–32)
Calcium: 8.6 mg/dL — ABNORMAL LOW (ref 8.9–10.3)
Chloride: 109 mmol/L (ref 101–111)
Creatinine, Ser: 0.85 mg/dL (ref 0.61–1.24)
GFR calc Af Amer: >60 mL/min
GFR calc non Af Amer: >60 mL/min
Glucose, Bld: 146 mg/dL — ABNORMAL HIGH (ref 65–99)
Potassium: 4.3 mmol/L (ref 3.5–5.1)
Sodium: 142 mmol/L (ref 135–145)

## 2017-07-28 MED ORDER — METHOCARBAMOL 500 MG PO TABS: 500.0000 mg | ORAL_TABLET | Freq: Four times a day (QID) | ORAL | 0 refills | Status: DC | PRN

## 2017-07-28 MED ORDER — TRAMADOL HCL 50 MG PO TABS: 50.0000 mg | ORAL_TABLET | Freq: Four times a day (QID) | ORAL | 0 refills | Status: DC | PRN

## 2017-07-28 MED ORDER — OXYCODONE HCL 5 MG PO TABS: 5.0000 mg | ORAL_TABLET | Freq: Four times a day (QID) | ORAL | 0 refills | Status: DC | PRN

## 2017-07-28 NOTE — Op Note (Signed)
NAME: Lia HoppingMARSHALL, Neythan W. MEDICAL RECORD [id number] ACCOUNT [id number] DATE OF BIRTH:[birth year] FACILITY: [facility] LOCATION: [facility] PHYSICIAN:Owen Pratte Dulcy FannyV. Cant[place] Yearby, MD  OPERATIVE REPORT  DATE OF PROCEDURE:  07/27/2017  PREOPERATIVE DIAGNOSIS:  Unstable right total knee arthroplasty.  POSTOPERATIVE DIAGNOSIS:  Unstable right total knee arthroplasty.  PROCEDURE:  Right knee polyethylene revision.  SURGEON:  Trudee GripFrank Alusio, MD  ASSISTANT:  Vertell NovakSteve Chapman, PA-C.  ANESTHESIA:  Spinal.  ESTIMATED BLOOD LOSS:  Minimal.  DRAINS:  None.  COMPLICATIONS:  None.  TOURNIQUET TIME:  33 minutes at 300 mmHg.  CONDITION:  Stable to recovery.  BRIEF CLINICAL NOTE:  The patient is a 67 year old male several years post right total knee arthroplasty.  He did fine into an accident at work over a year ago, which he fell and twisted his knee.  He has had instability in his total knee since.  He had  workup which was negative for infection and negative for prosthetic loosening.  He has been braced and had persistent worsening pain.  He presents now for polyethylene versus total knee revision.  DESCRIPTION OF  FINDINGS:  After successful administration of a spinal anesthetic.  A tourniquet was placed on the right thigh, right lower extremity.  Prepped and draped in the usual sterile fashion.  The extremity was wrapped in an Esmarch, the knee  flexed and tourniquet inflated to 300 mmHg.  Exam under anesthesia shows that he has got gross varus valgus laxity in full extension and significant AP laxity in 90 degrees of flexion.  After the extremity was wrapped and the tourniquet inflated.  Then,  the midline incision was made with a 10 blade through subcutaneous tissue to the level of the extensor mechanism.  A fresh blade was used to make a medial parapatellar arthrotomy.  Soft tissue on the proximal medial tibia subperiosteally elevated the  joint line with a knife and entered the semimembranosus bursa with a Cobb  elevator.  Soft tissue laterally was elevated with attention being paid to avoid the patellar tendon on the tibial tubercle.  Knee is flex 90 degrees and I tried to sublux the  tibia from the femur.  I could not fully dislocate it.  I removed the tibial polyethylene and it was a 10 mm thick poly.  I examined the interface between the femoral component of bone and tibial component of bone and there was no evidence of any  loosening of either component.  We then trialed a 15 mm posterior stabilized rotating platform insert, which was a size 5.  That trial was placed in the tibial tray.  The knee was reduced and has outstanding stability.  Full extension was achieved with  excellent varus valgus balance in full extension and excellent AP balance and 90 degrees of flexion.  The trials were removed and a permanent 15 mm posterior stabilized rotating platform insert was placed in the tibial tray.  This was a size 5.  The knee  is reduced and has outstanding stability with full extension and no varus valgus laxity.  It down passed 100 degrees of flexion with no AP laxity.  Wound was copiously irrigated with saline solution and 20 mL of Exparel mixed with 60 mL of saline were  injected into the extensor mechanism, the periosteum of the femur and the subcutaneous tissues.  The arthrotomy was then closed with a running  #1 V-Loc suture.  Tourniquet was released for a total time of 33 minutes.  Subcutaneous was closed with  interrupted 2-0 Vicryl and subcuticular  running 4-0 Monocryl.  The incision was cleaned and dried and Steri-Strips and a bulky sterile dressing applied.  He was placed into a knee immobilizer, awakened and transported to recovery in stable condition.  Please note a surgical assistant was a medical necessity for this procedure in order to do it in a safe and expeditious manner.  Surgical assistant was necessary for removal of the old prosthesis and safe and accurate placement of the new  prosthesis.  AN/NUANCE  D:07/27/2017 T:07/28/2017 JOB:[id number]

## 2017-07-28 NOTE — Progress Notes (Signed)
Pt. set up with/placed on CPAP in automode, with own nasal pillows, no humidity requested, currently on room air, tolerating well, wife remains at bedside.

## 2017-07-28 NOTE — Progress Notes (Signed)
   Subjective: 1 Day Post-Op Procedure(s) (LRB): RIGHT KNEE POLYETHYLENE REVISION (Right) Patient reports pain as mild.   Patient seen in rounds with Dr. Lequita HaltAluisio. Patient is well, and has had no acute complaints or problems. No issues overnight. No SOB or chest pain. Walked 200 ft with therapy yesterday. Plan is to go Home after hospital stay.  Objective: Vital signs in last 24 hours: Temp:  [97.3 F (36.3 C)-98.6 F (37 C)] 97.3 F (36.3 C) (06/13 0641) Pulse Rate:  [35-90] 77 (06/13 0641) Resp:  [12-22] 17 (06/13 0641) BP: (108-173)/(66-111) 117/74 (06/13 0641) SpO2:  [92 %-100 %] 96 % (06/13 0641) Weight:  [150.6 kg (332 lb)] 150.6 kg (332 lb) (06/12 0927)  Intake/Output from previous day:  Intake/Output Summary (Last 24 hours) at 07/28/2017 0718 Last data filed at 07/28/2017 0600 Gross per 24 hour  Intake 3582.08 ml  Output 3350 ml  Net 232.08 ml     Labs: Recent Labs    07/28/17 0538  HGB 13.5   Recent Labs    07/28/17 0538  WBC 12.2*  RBC 4.42  HCT 39.7  PLT 243   Recent Labs    07/28/17 0538  NA 142  K 4.3  CL 109  CO2 28  BUN 13  CREATININE 0.85  GLUCOSE 146*  CALCIUM 8.6*    EXAM General - Patient is Alert and Oriented Extremity - Neurologically intact Intact pulses distally Dorsiflexion/Plantar flexion intact No cellulitis present Compartment soft Dressing - dressing C/D/I Motor Function - intact, moving foot and toes well on exam.    Past Medical History:  Diagnosis Date  . Arthritis    BOTH KNEES  . Asthma    uncomplicated  . Bronchitis    LAST TIME WAS JAN 2014  . Chronic insomnia 11/14/2015  . Chronic left shoulder pain 01/19/2016  . Elevated blood pressure reading   . History of kidney stones   . Morbid obesity (HCC) 11/14/2015  . New onset a-fib (HCC)   . Obstructive chronic bronchitis without exacerbation (HCC) 03/14/2008  . Osteoarthritis of knee 11/14/2015  . Other general symptoms and signs 02/29/2008  . PONV  (postoperative nausea and vomiting)   . Sleep apnea    cpap wears at times not nightly     Assessment/Plan: 1 Day Post-Op Procedure(s) (LRB): RIGHT KNEE POLYETHYLENE REVISION (Right) Principal Problem:   Failed total knee arthroplasty (HCC)  Estimated body mass index is 43.8 kg/m as calculated from the following:   Height as of this encounter: 6\' 1"  (1.854 m).   Weight as of this encounter: 150.6 kg (332 lb). Advance diet Up with therapy D/C IV fluids when tolerating POs well  DVT Prophylaxis - Xarelto Weight-Bearing as tolerated  D/C O2 and Pulse OX and try on Room Air  Continue with PT this morning. Plan for DC home today if he continues to progress. Follow up in office in 2 weeks.  Dimitri PedAmber Rease Wence, PA-C Orthopaedic Surgery 07/28/2017, 7:18 AM

## 2017-07-28 NOTE — Progress Notes (Signed)
Physical Therapy Treatment Patient Details Name: Brent Dougherty MRN: [id number] DOB: 11/14/1950 Today's Date: 07/28/2017    History of Present Illness Pt s/p R TKR revision    PT Comments    POD # 1 am session Assisted OOB to amb in hallway then returned to room back to bed for TE's when noted new bright red blood on ACE wrap distal to knee.  Removed and reapplied with pressure and ICE.  Performed some TKR TE's.  Follow Up Recommendations  Follow surgeon's recommendation for DC plan and follow-up therapies     Equipment Recommendations  None recommended by PT    Recommendations for Other Services       Precautions / Restrictions Precautions Precautions: Knee;Fall Precaution Booklet Issued: Yes (comment) Precaution Comments: instructed on KI for stairs   pt able to perform SLR Required Braces or Orthoses: Knee Immobilizer - Right Knee Immobilizer - Right: Discontinue once straight leg raise with < 10 degree lag Restrictions Weight Bearing Restrictions: No Other Position/Activity Restrictions: WBAT    Mobility  Bed Mobility Overal bed mobility: Needs Assistance Bed Mobility: Sidelying to Sit;Supine to Sit   Sidelying to sit: Min guard Supine to sit: Min guard     General bed mobility comments: min cues for sequence able to self perform  Transfers Overall transfer level: Needs assistance Equipment used: Rolling walker (2 wheeled) Transfers: Sit to/from [employer] CorporationStand;Stand Pivot Transfers Sit to Stand: Min guard;Supervision Stand pivot transfers: Supervision;Min guard       General transfer comment: min cues for LE management and use of UEs to self assist safety   Ambulation/Gait Ambulation/Gait assistance: Supervision;Min guard Gait Distance (Feet): 185 Feet Assistive device: Rolling walker (2 wheeled) Gait Pattern/deviations: Step-to pattern;Step-through pattern;Shuffle;Trunk flexed     General Gait Details: cues for posture, position from RW and initial  sequence   Stairs   Up 2 steps forward b rails one VC on proper sequencing          Wheelchair Mobility    Modified Rankin (Stroke Patients Only)       Balance                                            Cognition Arousal/Alertness: Awake/alert Behavior During Therapy: WFL for tasks assessed/performed Overall Cognitive Status: Within Functional Limits for tasks assessed                                        Exercises   Total Knee Replacement TE's 10 reps B LE ankle pumps 10 reps towel squeezes 10 reps knee presses 10 reps heel slides  10 reps SAQ's 10 reps SLR's 10 reps ABD Followed by ICE   General Comments        Pertinent Vitals/Pain Pain Assessment: 0-10 Pain Score: 6  Pain Location: R knee Pain Descriptors / Indicators: Aching;Sore Pain Intervention(s): Monitored during session;Repositioned;Premedicated before session;Ice applied    Home Living                      Prior Function            PT Goals (current goals can now be found in the care plan section) Progress towards PT goals: Progressing toward goals    Frequency    7X/week  PT Plan Current plan remains appropriate    Co-evaluation              AM-PAC PT "6 Clicks" Daily Activity  Outcome Measure  Difficulty turning over in bed (including adjusting bedclothes, sheets and blankets)?: A Little Difficulty moving from lying on back to sitting on the side of the bed? : A Little Difficulty sitting down on and standing up from a chair with arms (e.g., wheelchair, bedside commode, etc,.)?: A Little Help needed moving to and from a bed to chair (including a wheelchair)?: A Little Help needed walking in hospital room?: A Little Help needed climbing 3-5 steps with a railing? : A Lot 6 Click Score: 17    End of Session Equipment Utilized During Treatment: Right knee immobilizer Activity Tolerance: Patient tolerated treatment  well Patient left: with call bell/phone within reach;with family/visitor present;in bed Nurse Communication: Mobility status(pt will need another PT session this afternoon) PT Visit Diagnosis: Difficulty in walking, not elsewhere classified (R26.2)     Time: 0930-1010 PT Time Calculation (min) (ACUTE ONLY): 40 min  Charges:  $Gait Training: 8-22 mins $Therapeutic Exercise: 8-22 mins $Therapeutic Activity: 8-22 mins                    G Codes:       Felecia Shelling  PTA [facility]  Acute  Rehab Pager      [phone]

## 2017-07-28 NOTE — Progress Notes (Signed)
Physical Therapy Treatment Patient Details Name: Brent Dougherty MRN: [id number] DOB: 02/20/1950 Today's Date: 07/28/2017    History of Present Illness Pt s/p R TKR revision    PT Comments    POD # 1 pm session Spouse present.  Applied KI and instructed to wear for multiple steps going into home.  Instructed on propper application.  Assisted to amb in hallway with walker and practiced 5 steps with spouse "hands on" with instruction on proper sequencing and safe handling.  Pt stated his walker was in storage and stated his home was not big enough to use a walker.  When asked how he expected to amb, he stated with one crutch.  So with KI on, had pt amb back to his room using one crutch on R side.  He did pretty good but not as safe as a walker.  Advised pt, if he plans to use a crutch to wear KI for increased support.  When assisted back to bed, note again bright new red blood from distal incision.  RN in room at time.  Removed dressing and applied pressure and ICE.  RN reapplied/reinforced incision.  All mobility questions addressed.  Pt ready for D/C to home.    Follow Up Recommendations  Follow surgeon's recommendation for DC plan and follow-up therapies     Equipment Recommendations  None recommended by PT    Recommendations for Other Services       Precautions / Restrictions Precautions Precautions: Knee;Fall Precaution Booklet Issued: Yes (comment) Precaution Comments: instructed on KI for stairs   pt able to perform SLR Required Braces or Orthoses: Knee Immobilizer - Right Knee Immobilizer - Right: Discontinue once straight leg raise with < 10 degree lag Restrictions Weight Bearing Restrictions: No Other Position/Activity Restrictions: WBAT    Mobility  Bed Mobility Overal bed mobility: Needs Assistance Bed Mobility: Sidelying to Sit;Supine to Sit   Sidelying to sit: Min guard Supine to sit: Min guard     General bed mobility comments: min cues for sequence able to  self perform  Transfers Overall transfer level: Needs assistance Equipment used: Rolling walker (2 wheeled) Transfers: Sit to/from [employer]Stand;Stand Pivot Transfers Sit to Stand: Min guard;Supervision Stand pivot transfers: Supervision;Min guard       General transfer comment: min cues for LE management and use of UEs to self assist safety   Ambulation/Gait Ambulation/Gait assistance: Supervision;Min guard Gait Distance (Feet): 55 Feet Assistive device: Rolling walker (2 wheeled) Gait Pattern/deviations: Step-to pattern;Step-through pattern;Shuffle;Trunk flexed     General Gait Details: cues for posture, position from RW and initial sequence   Stairs Stairs: Yes Stairs assistance: Min guard Stair Management: One rail Right;Step to pattern;Forwards;With crutches Number of Stairs: 5 General stair comments: with spouse present and 50% VC's on safety   Wheelchair Mobility    Modified Rankin (Stroke Patients Only)       Balance                                            Cognition Arousal/Alertness: Awake/alert Behavior During Therapy: WFL for tasks assessed/performed Overall Cognitive Status: Within Functional Limits for tasks assessed                                        Exercises  General Comments        Pertinent Vitals/Pain Pain Assessment: 0-10 Pain Score: 6  Pain Location: R knee Pain Descriptors / Indicators: Aching;Sore Pain Intervention(s): Monitored during session;Repositioned;Premedicated before session;Ice applied    Home Living                      Prior Function            PT Goals (current goals can now be found in the care plan section) Progress towards PT goals: Progressing toward goals    Frequency    7X/week      PT Plan Current plan remains appropriate    Co-evaluation              AM-PAC PT "6 Clicks" Daily Activity  Outcome Measure  Difficulty turning over in bed  (including adjusting bedclothes, sheets and blankets)?: A Little Difficulty moving from lying on back to sitting on the side of the bed? : A Little Difficulty sitting down on and standing up from a chair with arms (e.g., wheelchair, bedside commode, etc,.)?: A Little Help needed moving to and from a bed to chair (including a wheelchair)?: A Little Help needed walking in hospital room?: A Little Help needed climbing 3-5 steps with a railing? : A Lot 6 Click Score: 17    End of Session Equipment Utilized During Treatment: Right knee immobilizer Activity Tolerance: Patient tolerated treatment well Patient left: with call bell/phone within reach;with family/visitor present;in bed Nurse Communication: Mobility status(pt will need another PT session this afternoon) PT Visit Diagnosis: Difficulty in walking, not elsewhere classified (R26.2)     Time: 9147-8295 PT Time Calculation (min) (ACUTE ONLY): 26 min  Charges:  $Gait Training: 8-22 mins $Therapeutic Activity: 8-22 mins                    G Codes:       Felecia Shelling  PTA [facility]  Acute  Rehab Pager      [phone]

## 2017-07-28 NOTE — Progress Notes (Signed)
Spoke with patient at bedside. Confirmed plan for OP PT, already arranged. Has RW and 3n1. [phone] 

## 2017-07-29 NOTE — Discharge Summary (Signed)
Physician Discharge Summary   Patient ID: Brent Dougherty MRN: [id number] DOB/AGE: 66-Apr-1952 66 y.o.  Admit date: 07/27/2017 Discharge date: 07/28/2017  Primary Diagnosis: Failed total knee arthroplasty    Admission Diagnoses:  Past Medical History:  Diagnosis Date  . Arthritis    BOTH KNEES  . Asthma    uncomplicated  . Bronchitis    LAST TIME WAS JAN 2014  . Chronic insomnia 11/14/2015  . Chronic left shoulder pain 01/19/2016  . Elevated blood pressure reading   . History of kidney stones   . Morbid obesity ([place]) 11/14/2015  . New onset a-fib ([place])   . Obstructive chronic bronchitis without exacerbation ([place]) 03/14/2008  . Osteoarthritis of knee 11/14/2015  . Other general symptoms and signs 02/29/2008  . PONV (postoperative nausea and vomiting)   . Sleep apnea    cpap wears at times not nightly    Discharge Diagnoses:   Principal Problem:   Failed total knee arthroplasty ([place])  Estimated body mass index is 43.8 kg/m as calculated from the following:   Height as of this encounter: 6' 1" (1.854 m).   Weight as of this encounter: 150.6 kg (332 lb).  Procedure:  Procedure(s) (LRB): RIGHT KNEE POLYETHYLENE REVISION (Right)   Consults: None  HPI: Brent Dougherty, 67 y.o. male, has a history of pain and functional disability in the right knee(s) due to trauma and failed previous arthroplasty and patient has failed non-surgical conservative treatments for greater than 12 weeks to include NSAID's and/or analgesics, flexibility and strengthening excercises, supervised PT with diminished ADL's post treatment, use of assistive devices and activity modification. The indications for the revision of the total knee arthroplasty are possible loosening of one or more components and bearing surface wear leading to symptomatic synovitis and tibiofemoral instability. Onset of symptoms was abrupt starting 1 year ago with gradually worsening course since that time.  Prior procedures on the  right knee(s) include arthroplasty. Patient currently rates pain in the right knee(s) at 8 out of 10 with activity. There is night pain, worsening of pain with activity and weight bearing, pain that interferes with activities of daily living, pain with passive range of motion, crepitus and joint swelling.  Patient has evidence of prosthetic loosening by imaging studies. This condition presents safety issues increasing the risk of falls. This patient has had trauma to right knee causing instability of the right TKA.  There is no current active infection.    Laboratory Data: Admission on 07/27/2017, Discharged on 07/28/2017  Component Date Value Ref Range Status  . WBC 07/28/2017 12.2* 4.0 - 10.5 K/uL Final  . RBC 07/28/2017 4.42  4.22 - 5.81 MIL/uL Final  . Hemoglobin 07/28/2017 13.5  13.0 - 17.0 g/dL Final  . HCT 07/28/2017 39.7  39.0 - 52.0 % Final  . MCV 07/28/2017 89.8  78.0 - 100.0 fL Final  . MCH 07/28/2017 30.5  26.0 - 34.0 pg Final  . MCHC 07/28/2017 34.0  30.0 - 36.0 g/dL Final  . RDW 07/28/2017 12.7  11.5 - 15.5 % Final  . Platelets 07/28/2017 243  150 - 400 K/uL Final   Performed at [facility], [place] [address], [place], [place] [address]  . Sodium 07/28/2017 142  135 - 145 mmol/L Final  . Potassium 07/28/2017 4.3  3.5 - 5.1 mmol/L Final  . Chloride 07/28/2017 109  101 - 111 mmol/L Final  . CO2 07/28/2017 28  22 - 32 mmol/L Final  . Glucose, Bld 07/28/2017 146*  65 - 99 mg/dL Final  . BUN 07/28/2017 13  6 - 20 mg/dL Final  . Creatinine, Ser 07/28/2017 0.85  0.61 - 1.24 mg/dL Final  . Calcium 07/28/2017 8.6* 8.9 - 10.3 mg/dL Final  . GFR calc non Af Amer 07/28/2017 >60  >60 mL/min Final  . GFR calc Af Amer 07/28/2017 >60  >60 mL/min Final   Comment: (NOTE) The eGFR has been calculated using the CKD EPI equation. This calculation has not been validated in all clinical situations. eGFR's persistently <60 mL/min signify possible Chronic Kidney Disease.   Georgiann Hahn gap 07/28/2017 5  5 - 15 Final   Performed at [facility], [place] [address] [place], [place] [address]  Hospital Outpatient Visit on 07/21/2017  Component Date Value Ref Range Status  . MRSA, PCR 07/21/2017 NEGATIVE  NEGATIVE Final  . Staphylococcus aureus 07/21/2017 NEGATIVE  NEGATIVE Final   Comment: (NOTE) The Xpert SA Assay (FDA approved for NASAL specimens in patients 49 years of age and older), is one component of a comprehensive surveillance program. It is not intended to diagnose infection nor to guide or monitor treatment. Performed at [facility], [place] [address], [place], [place] [address]   . aPTT 07/21/2017 34  24 - 36 seconds Final   Performed at [facility], [place] [address] [place], [place] [address]  . WBC 07/21/2017 7.0  4.0 - 10.5 K/uL Final  . RBC 07/21/2017 4.70  4.22 - 5.81 MIL/uL Final  . Hemoglobin 07/21/2017 14.4  13.0 - 17.0 g/dL Final  . HCT 07/21/2017 43.0  39.0 - 52.0 % Final  . MCV 07/21/2017 91.5  78.0 - 100.0 fL Final  . MCH 07/21/2017 30.6  26.0 - 34.0 pg Final  . MCHC 07/21/2017 33.5  30.0 - 36.0 g/dL Final  . RDW 07/21/2017 12.8  11.5 - 15.5 % Final  . Platelets 07/21/2017 251  150 - 400 K/uL Final   Performed at [facility], [place] [address], [place], [place] [address]  . Sodium 07/21/2017 143  135 - 145 mmol/L Final  . Potassium 07/21/2017 4.7  3.5 - 5.1 mmol/L Final  . Chloride 07/21/2017 105  101 - 111 mmol/L Final  . CO2 07/21/2017 31  22 - 32 mmol/L Final  . Glucose, Bld 07/21/2017 92  65 - 99 mg/dL Final  . BUN 07/21/2017 18  6 - 20 mg/dL Final  . Creatinine, Ser 07/21/2017 1.00  0.61 - 1.24 mg/dL Final  . Calcium 07/21/2017 9.2  8.9 - 10.3 mg/dL Final  . Total Protein 07/21/2017 6.6  6.5 - 8.1 g/dL Final  . Albumin 07/21/2017 3.7  3.5 - 5.0 g/dL Final  . AST 07/21/2017 17  15 - 41 U/L Final  . ALT 07/21/2017 17  17 - 63 U/L Final  . Alkaline Phosphatase  07/21/2017 56  38 - 126 U/L Final  . Total Bilirubin 07/21/2017 0.6  0.3 - 1.2 mg/dL Final  . GFR calc non Af Amer 07/21/2017 >60  >60 mL/min Final  . GFR calc Af Amer 07/21/2017 >60  >60 mL/min Final   Comment: (NOTE) The eGFR has been calculated using the CKD EPI equation. This calculation has not been validated in all clinical situations. eGFR's persistently <60 mL/min signify possible Chronic Kidney Disease.   Georgiann Hahn gap 07/21/2017 7  5 - 15 Final   Performed at [facility], [place] [address], [place], [place] [address]  . Prothrombin Time  07/21/2017 15.8* 11.4 - 15.2 seconds Final  . INR 07/21/2017 1.27   Final   Performed at [facility], [place] [address], [place], [place] [address]  . ABO/RH(D) 07/21/2017 A POS   Final  . Antibody Screen 07/21/2017 NEG   Final  . Sample Expiration 07/21/2017 07/30/2017   Final  . Extend sample reason 07/21/2017    Final                   Value:NO TRANSFUSIONS OR PREGNANCY IN THE PAST 3 MONTHS Performed at [facility], [place] [address], [place], [place] [address]   . Color, Urine 07/21/2017 STRAW* YELLOW Final  . APPearance 07/21/2017 CLEAR  CLEAR Final  . Specific Gravity, Urine 07/21/2017 1.006  1.005 - 1.030 Final  . pH 07/21/2017 7.0  5.0 - 8.0 Final  . Glucose, UA 07/21/2017 NEGATIVE  NEGATIVE mg/dL Final  . Hgb urine dipstick 07/21/2017 NEGATIVE  NEGATIVE Final  . Bilirubin Urine 07/21/2017 NEGATIVE  NEGATIVE Final  . Ketones, ur 07/21/2017 NEGATIVE  NEGATIVE mg/dL Final  . Protein, ur 07/21/2017 NEGATIVE  NEGATIVE mg/dL Final  . Nitrite 07/21/2017 NEGATIVE  NEGATIVE Final  . Leukocytes, UA 07/21/2017 NEGATIVE  NEGATIVE Final   Performed at [facility] [address], [place], [place] [address]  Appointment on 06/17/2017  Component Date Value Ref Range Status  . Rest HR 06/17/2017 66  bpm Final  . Rest BP 06/17/2017 128/80  mmHg Final  . RPE 06/17/2017 15    Final  . Exercise duration (sec) 06/17/2017 59  sec Final  . Percent HR 06/17/2017 82  % Final  . Exercise duration (min) 06/17/2017 11  min Final  . Estimated workload 06/17/2017 15.0  METS Final  . Peak HR 06/17/2017 127  bpm Final  . Peak BP 06/17/2017 198/74  mmHg Final  . MPHR 06/17/2017 155  bpm Final     X-Rays:No results found.  EKG: Orders placed or performed in visit on 06/01/17  . EKG 12-Lead     Hospital Course: Brent Dougherty is a 67 y.o. who was admitted to [facility]. They were brought to the operating room on 07/27/2017 and underwent Procedure(s): [place].  Patient tolerated the procedure well and was later transferred to the recovery room and then to the orthopaedic floor for postoperative care.  They were given PO and IV analgesics for pain control following their surgery.  They were given 24 hours of postoperative antibiotics of  Anti-infectives (From admission, onward)   Start     Dose/Rate Route Frequency Ordered Stop   07/27/17 1800  ceFAZolin (ANCEF) IVPB 2g/100 mL premix     2 g 200 mL/hr over 30 Minutes Intravenous Every 6 hours 07/27/17 1429 07/28/17 0030   07/27/17 0600  ceFAZolin (ANCEF) 3 g in dextrose 5 % 50 mL IVPB     3 g 100 mL/hr over 30 Minutes Intravenous On call to O.R. 07/26/17 1220 07/27/17 1126     and started on DVT prophylaxis in the form of Xarelto.   PT and OT were ordered for total joint protocol.  Discharge planning consulted to help with postop disposition and equipment needs.  Patient had a good night on the evening of surgery.  They started to get up OOB with therapy on day one. Tthe patient had progressed with therapy and meeting their goals.  Incision was healing well.  Patient was seen in rounds and was ready  to go home.   Diet: Cardiac diet Activity:WBAT Follow-up:in 2 weeks Disposition - Home Discharged Condition: stable   Discharge Instructions    Call MD / Call 911   Complete by:  As  directed    If you experience chest pain or shortness of breath, CALL 911 and be transported to the hospital emergency room.  If you develope a fever above 101 F, pus (white drainage) or increased drainage or redness at the wound, or calf pain, call your surgeon's office.   Constipation Prevention   Complete by:  As directed    Drink plenty of fluids.  Prune juice may be helpful.  You may use a stool softener, such as Colace (over the counter) 100 mg twice a day.  Use MiraLax (over the counter) for constipation as needed.   Diet - low sodium heart healthy   Complete by:  As directed    Discharge instructions   Complete by:  As directed    Dr. Gaynelle Arabian Total Joint Specialist [facility] [address], [place], [place] [address] [phone]  TOTAL KNEE REPLACEMENT POSTOPERATIVE DIRECTIONS  Knee Rehabilitation, Guidelines Following Surgery  Results after knee surgery are often greatly improved when you follow the exercise, range of motion and muscle strengthening exercises prescribed by your doctor. Safety measures are also important to protect the knee from further injury. Any time any of these exercises cause you to have increased pain or swelling in your knee joint, decrease the amount until you are comfortable again and slowly increase them. If you have problems or questions, call your caregiver or physical therapist for advice.   HOME CARE INSTRUCTIONS  Remove items at home which could result in a fall. This includes throw rugs or furniture in walking pathways.  ICE to the affected knee every three hours for 30 minutes at a time and then as needed for pain and swelling.  Continue to use ice on the knee for pain and swelling from surgery. You may notice swelling that will progress down to the foot and ankle.  This is normal after surgery.  Elevate the leg when you are not up walking on it.   Continue to use the breathing machine which will help keep your temperature down.   It is common for your temperature to cycle up and down following surgery, especially at night when you are not up moving around and exerting yourself.  The breathing machine keeps your lungs expanded and your temperature down. Do not place pillow under knee, focus on keeping the knee straight while resting  DIET You may resume your previous home diet once your are discharged from the hospital.  DRESSING / WOUND CARE / SHOWERING You may change your dressing every day with sterile gauze.  Please use good hand washing techniques before changing the dressing.  Do not use any lotions or creams on the incision until instructed by your surgeon. You may start showering once you are discharged home but do not submerge the incision under water. Just pat the incision dry and apply a dry gauze dressing on daily. Change the surgical dressing daily and reapply a dry dressing each time.  ACTIVITY Walk with your walker as instructed. Use walker as long as suggested by your caregivers. Avoid periods of inactivity such as sitting longer than an hour when not asleep. This helps prevent blood clots.  You may resume a sexual relationship in one month or when given the OK by your doctor.  You  may return to work once you are cleared by your doctor.  Do not drive a car for 6 weeks or until released by you surgeon.  Do not drive while taking narcotics.  WEIGHT BEARING Weight bearing as tolerated with assist device (walker, cane, etc) as directed, use it as long as suggested by your surgeon or therapist, typically at least 4-6 weeks.  POSTOPERATIVE CONSTIPATION PROTOCOL Constipation - defined medically as fewer than three stools per week and severe constipation as less than one stool per week.  One of the most common issues patients have following surgery is constipation.  Even if you have a regular bowel pattern at home, your normal regimen is likely to be disrupted due to multiple reasons following surgery.   Combination of anesthesia, postoperative narcotics, change in appetite and fluid intake all can affect your bowels.  In order to avoid complications following surgery, here are some recommendations in order to help you during your recovery period.  Colace (docusate) - Pick up an over-the-counter form of Colace or another stool softener and take twice a day as long as you are requiring postoperative pain medications.  Take with a full glass of water daily.  If you experience loose stools or diarrhea, hold the colace until you stool forms back up.  If your symptoms do not get better within 1 week or if they get worse, check with your doctor.  Dulcolax (bisacodyl) - Pick up over-the-counter and take as directed by the product packaging as needed to assist with the movement of your bowels.  Take with a full glass of water.  Use this product as needed if not relieved by Colace only.   MiraLax (polyethylene glycol) - Pick up over-the-counter to have on hand.  MiraLax is a solution that will increase the amount of water in your bowels to assist with bowel movements.  Take as directed and can mix with a glass of water, juice, soda, coffee, or tea.  Take if you go more than two days without a movement. Do not use MiraLax more than once per day. Call your doctor if you are still constipated or irregular after using this medication for 7 days in a row.  If you continue to have problems with postoperative constipation, please contact the office for further assistance and recommendations.  If you experience "the worst abdominal pain ever" or develop nausea or vomiting, please contact the office immediatly for further recommendations for treatment.  ITCHING  If you experience itching with your medications, try taking only a single pain pill, or even half a pain pill at a time.  You can also use Benadryl over the counter for itching or also to help with sleep.   TED HOSE STOCKINGS Wear the elastic stockings on both  legs for three weeks following surgery during the day but you may remove then at night for sleeping.  MEDICATIONS See your medication summary on the "After Visit Summary" that the nursing staff will review with you prior to discharge.  You may have some home medications which will be placed on hold until you complete the course of blood thinner medication.  It is important for you to complete the blood thinner medication as prescribed by your surgeon.  Continue your approved medications as instructed at time of discharge.  PRECAUTIONS If you experience chest pain or shortness of breath - call 911 immediately for transfer to the hospital emergency department.  If you develop a fever greater that 101 F, purulent drainage from  wound, increased redness or drainage from wound, foul odor from the wound/dressing, or calf pain - CONTACT YOUR SURGEON.                                                   FOLLOW-UP APPOINTMENTS Make sure you keep all of your appointments after your operation with your surgeon and caregivers. You should call the office at the above phone number and make an appointment for approximately two weeks after the date of your surgery or on the date instructed by your surgeon outlined in the "After Visit Summary".   RANGE OF MOTION AND STRENGTHENING EXERCISES  Rehabilitation of the knee is important following a knee injury or an operation. After just a few days of immobilization, the muscles of the thigh which control the knee become weakened and shrink (atrophy). Knee exercises are designed to build up the tone and strength of the thigh muscles and to improve knee motion. Often times heat used for twenty to thirty minutes before working out will loosen up your tissues and help with improving the range of motion but do not use heat for the first two weeks following surgery. These exercises can be done on a training (exercise) mat, on the floor, on a table or on a bed. Use what ever works the  best and is most comfortable for you Knee exercises include:  Leg Lifts - While your knee is still immobilized in a splint or cast, you can do straight leg raises. Lift the leg to 60 degrees, hold for 3 sec, and slowly lower the leg. Repeat 10-20 times 2-3 times daily. Perform this exercise against resistance later as your knee gets better.  Quad and Hamstring Sets - Tighten up the muscle on the front of the thigh (Quad) and hold for 5-10 sec. Repeat this 10-20 times hourly. Hamstring sets are done by pushing the foot backward against an object and holding for 5-10 sec. Repeat as with quad sets.  Leg Slides: Lying on your back, slowly slide your foot toward your buttocks, bending your knee up off the floor (only go as far as is comfortable). Then slowly slide your foot back down until your leg is flat on the floor again. Angel Wings: Lying on your back spread your legs to the side as far apart as you can without causing discomfort.  A rehabilitation program following serious knee injuries can speed recovery and prevent re-injury in the future due to weakened muscles. Contact your doctor or a physical therapist for more information on knee rehabilitation.   IF YOU ARE TRANSFERRED TO A SKILLED REHAB FACILITY If the patient is transferred to a skilled rehab facility following release from the hospital, a list of the current medications will be sent to the facility for the patient to continue.  When discharged from the skilled rehab facility, please have the facility set up the patient's [place] prior to being released. Also, the skilled facility will be responsible for providing the patient with their medications at time of release from the facility to include their pain medication, the muscle relaxants, and their blood thinner medication. If the patient is still at the rehab facility at time of the two week follow up appointment, the skilled rehab facility will also need to assist the  patient in arranging follow up appointment in our  office and any transportation needs.  MAKE SURE YOU:  Understand these instructions.  Get help right away if you are not doing well or get worse.    Pick up stool softner and laxative for home use following surgery while on pain medications. Do not submerge incision under water. Please use good hand washing techniques while changing dressing each day. May shower starting three days after surgery. Please use a clean towel to pat the incision dry following showers. Continue to use ice for pain and swelling after surgery. Do not use any lotions or creams on the incision until instructed by your surgeon.   Increase activity slowly as tolerated   Complete by:  As directed      Allergies as of 07/28/2017      Reactions   Morphine And Related Other (See Comments)   MILD NAUSEA      Medication List    TAKE these medications   ADVIL PM PO Take 3-4 tablets by mouth at bedtime as needed (for sleep/pain).   diltiazem 120 MG 24 hr capsule Commonly known as:  CARDIZEM CD Take 120 mg by mouth daily.   flecainide 50 MG tablet Commonly known as:  TAMBOCOR Take 1.5 tablets (75 mg total) by mouth 2 (two) times daily.   hydrochlorothiazide 12.5 MG capsule Commonly known as:  MICROZIDE Take 12.5 mg by mouth 2 (two) times daily.   methocarbamol 500 MG tablet Commonly known as:  ROBAXIN Take 1 tablet (500 mg total) by mouth every 6 (six) hours as needed for muscle spasms.   metoprolol tartrate 25 MG tablet Commonly known as:  LOPRESSOR Take 1 tablet (25 mg total) by mouth 2 (two) times daily.   oxyCODONE 5 MG immediate release tablet Commonly known as:  Oxy IR/ROXICODONE Take 1-2 tablets (5-10 mg total) by mouth every 6 (six) hours as needed for moderate pain (pain score 4-6).   rivaroxaban 20 MG Tabs tablet Commonly known as:  XARELTO Take 1 tablet (20 mg total) by mouth daily with supper.   traMADol 50 MG tablet Commonly known as:   ULTRAM Take 1-2 tablets (50-100 mg total) by mouth every 6 (six) hours as needed for moderate pain (not responding to oxycodone).   zolpidem 10 MG tablet Commonly known as:  AMBIEN Take 1 tablet (10 mg total) by mouth at bedtime.      Follow-up Information    Gaynelle Arabian, MD. Schedule an appointment as soon as possible for a visit on 08/09/2017.   Specialty:  Orthopedic Surgery Contact information: [address] [place] [address] [phone]           Signed: Ardeen Jourdain, PA-C Orthopaedic Surgery 07/29/2017, 7:05 AM

## 2017-09-12 NOTE — Progress Notes (Signed)
Cardiology Office Note   Date:  09/14/2017   ID:  Brent Dougherty, Brent Dougherty [birth year], MRN [id number]  PCP:  [name], MD  Cardiologist:   [name], MD   No chief complaint on file.     History of Present Illness:  67 y.o. first seen December 2018 for new onset afib. Had poorly controlled HTN Diet with lots of caffeine and Mountain Dew  Obese With OSA wears CPAP. CHAdVASC Score 2.   He was started on xarelto , beta blocker and diuretic   Echo reviewed 01/27/17 EF 55-60% normal atrial sizes   Labs reviewed 01/17/17   LDL 126 HDL 60 K 4.6 Cr .96 normal LFT;s Hct 44.6   Ultimately had DCC on 03/08/17 Failed to convert. Started on flecainide 06/01/17  ETT f/u normal   Cardioversion repeat posponed due to right TKR revision by [name] done 07/28/17  He has had a hematoma and ? Infection of the knee Has been through 3 rounds Of antibiotics and is on 1/2 dose xarelto Told him looking at his knee I was still Concerned about effusion and infection  Since not on RX anticoagulation cannot Schedule [facility] now anyway    Past Medical History:  Diagnosis Date  . Arthritis    BOTH KNEES  . Asthma    uncomplicated  . Bronchitis    LAST TIME WAS JAN 2014  . Chronic insomnia 11/14/2015  . Chronic left shoulder pain 01/19/2016  . Elevated blood pressure reading   . History of kidney stones   . Morbid obesity (HCC) 11/14/2015  . New onset a-fib (HCC)   . Obstructive chronic bronchitis without exacerbation (HCC) 03/14/2008  . Osteoarthritis of knee 11/14/2015  . Other general symptoms and signs 02/29/2008  . PONV (postoperative nausea and vomiting)   . Sleep apnea    cpap wears at times not nightly     Past Surgical History:  Procedure Laterality Date  . BILATERAL KNEE ARTHROSCOPY    . CARDIOVERSION N/A 03/08/2017   Procedure: CARDIOVERSION;  Surgeon: [name], MD;  Location: [facility] ENDOSCOPY;  Service: Cardiovascular;  Laterality: N/A;  . PARTIAL AMPUTATION LEFT  THUMB AND TENDON REPAIR    . TONSILLECTOMY AND ADENOIDECTOMY    . TOTAL KNEE ARTHROPLASTY Right 06/19/2012   Procedure: RIGHT TOTAL KNEE ARTHROPLASTY;  Surgeon: [name], MD;  Location: [facility] ORS;  Service: Orthopedics;  Laterality: Right;  . TOTAL KNEE REVISION Right 07/27/2017   Procedure: RIGHT KNEE POLYETHYLENE REVISION;  Surgeon: [name], MD;  Location: [facility] ORS;  Service: Orthopedics;  Laterality: Right;  Adductor Block     Current Outpatient Medications  Medication Sig Dispense Refill  . diltiazem (CARDIZEM CD) 120 MG 24 hr capsule Take 120 mg by mouth daily.  5  . flecainide (TAMBOCOR) 50 MG tablet Take 1.5 tablets (75 mg total) by mouth 2 (two) times daily. 90 tablet 11  . hydrochlorothiazide (MICROZIDE) 12.5 MG capsule Take 12.5 mg by mouth 2 (two) times daily.    . Ibuprofen-Diphenhydramine Cit (ADVIL PM PO) Take 3-4 tablets by mouth at bedtime as needed (for sleep/pain).     . methocarbamol (ROBAXIN) 500 MG tablet Take 1 tablet (500 mg total) by mouth every 6 (six) hours as needed for muscle spasms. 40 tablet 0  . metoprolol tartrate (LOPRESSOR) 25 MG tablet Take 1 tablet (25 mg total) by mouth 2 (two) times daily. 180 tablet 3  . oxyCODONE (OXY IR/ROXICODONE) 5 MG immediate release tablet Take  1-2 tablets (5-10 mg total) by mouth every 6 (six) hours as needed for moderate pain (pain score 4-6). 56 tablet 0  . rivaroxaban (XARELTO) 20 MG TABS tablet Take 1 tablet (20 mg total) by mouth daily with supper. 90 tablet 3  . traMADol (ULTRAM) 50 MG tablet Take 1-2 tablets (50-100 mg total) by mouth every 6 (six) hours as needed for moderate pain (not responding to oxycodone). 40 tablet 0  . zolpidem (AMBIEN) 10 MG tablet Take 1 tablet (10 mg total) by mouth at bedtime. 30 tablet 5   No current facility-administered medications for this visit.     Allergies:   Morphine and related    Social History:  The patient  reports that he has never smoked. He has never used smokeless  tobacco. He reports that he does not drink alcohol or use drugs.   Family History:  The patient's family history includes Cancer in his mother; Cancer - Other in his father and paternal grandfather; Diabetes in his father and paternal grandfather; Heart disease in his father and paternal grandfather; Prostate cancer in his paternal grandfather.    ROS:  Please see the history of present illness.   Otherwise, review of systems are positive for none.   All other systems are reviewed and negative.    PHYSICAL EXAM: VS:  BP 112/70   Pulse 90   Ht 6\' 1"  (1.854 m)   Wt (!) 308 lb (139.7 kg)   SpO2 97%   BMI 40.64 kg/m  , BMI Body mass index is 40.64 kg/m. Affect appropriate Obese male  HEENT: normal Neck supple with no adenopathy JVP normal no bruits no thyromegaly Lungs clear with no wheezing and good diaphragmatic motion Heart:  S1/S2 no murmur, no rub, gallop or click PMI normal Abdomen: benighn, BS positve, no tenderness, no AAA no bruit.  No HSM or HJR Distal pulses intact with no bruits No edema Neuro non-focal Skin warm and dry Post right TKR still warm to touch erythematous with effusion to my exam    EKG:  06/01/17 afib rate 61 otherwise normal   Recent Labs: 07/21/2017: ALT 17 07/28/2017: BUN 13; Creatinine, Ser 0.85; Hemoglobin 13.5; Platelets 243; Potassium 4.3; Sodium 142    Lipid Panel No results found for: CHOL, TRIG, HDL, CHOLHDL, VLDL, LDLCALC, LDLDIRECT    Wt Readings from Last 3 Encounters:  09/14/17 (!) 308 lb (139.7 kg)  07/27/17 (!) 332 lb (150.6 kg)  07/23/17 (!) 332 lb (150.6 kg)      Other studies Reviewed: Additional studies/ records that were reviewed today include: [facility] labs ECG.    ASSESSMENT AND PLAN:  1.  Afib:  On beta blocker and xarelto failed DCC on 03/08/17 start flecainide 75 bid ETT done 06/17/17 with no pro arrhythmia or ischemia  DCC posponed due to right knee surgery done 07/28/17 Will see in 6 weeks and when  knee is better and able to take full dose xarelto [facility]   2. HTN Well controlled.  Continue current medications and low sodium Dash type diet.   3. Edema: from venous insuficiency and obesity with some skin breakdown and stasis Continue diuretic  4. Ortho: post right TKR with revision done 07/28/17 PT/OT f/u Alusio  I am still concerned looking at the knee that there is an infection and persistent effusion He has close f/u with Dr [name] and will continue 1/2 dose xarelto  5. OSA:  Encouraged him to wear  nightly and not " when I need it" Link to PAF discussed 6. Obesity:  Discussed bariatric surgery. Has lost weight in past with weight watchers.     [name]

## 2017-09-14 ENCOUNTER — Ambulatory Visit (INDEPENDENT_AMBULATORY_CARE_PROVIDER_SITE_OTHER): Payer: BLUE CROSS/BLUE SHIELD | Admitting: Cardiovascular Disease

## 2017-09-14 ENCOUNTER — Encounter: Payer: Self-pay | Admitting: Cardiovascular Disease

## 2017-09-14 VITALS — BP 112/70 | HR 90 | Ht 73.0 in | Wt 308.0 lb

## 2017-09-14 DIAGNOSIS — I480 Paroxysmal atrial fibrillation: Secondary | ICD-10-CM

## 2017-09-14 DIAGNOSIS — I10 Essential (primary) hypertension: Secondary | ICD-10-CM | POA: Diagnosis not present

## 2017-09-14 NOTE — Patient Instructions (Addendum)
Medication Instructions:  Your physician recommends that you continue on your current medications as directed. Please refer to the Current Medication list given to you today.  Labwork: NONE  Testing/Procedures: NONE  Follow-Up: Your physician wants you to follow-up in: 6 weeks with Dr. Nishan.    If you need a refill on your cardiac medications before your next appointment, please call your pharmacy.    

## 2017-10-06 ENCOUNTER — Ambulatory Visit: Payer: Self-pay | Admitting: Internal Medicine

## 2017-10-17 NOTE — Progress Notes (Deleted)
Cardiology Office Note   Date:  10/17/2017   ID:  Brent Dougherty, Brent Dougherty March 27, 1950, MRN [id number]  PCP:  Brent Banner, MD  Cardiologist:   [name], MD   No chief complaint on file.     History of Present Illness:  67 y.o. first seen December 2018 for new onset afib. Had poorly controlled HTN Diet with lots of caffeine and Mountain Dew  Obese With OSA wears CPAP. CHAdVASC Score 2.   He was started on xarelto , beta blocker and diuretic   Echo reviewed 01/27/17 EF 55-60% normal atrial sizes   Ultimately had DCC on 03/08/17 Failed to convert. Started on flecainide 06/01/17  ETT f/u normal   Cardioversion repeat posponed due to right TKR revision by [name] done 07/28/17  He has had a hematoma and ? Infection of the knee Has been through 3 rounds Of antibiotics and is on 1/2 dose xarelto Told him looking at his knee I was still Concerned about effusion and infection  Repeat [facility] postponed   ***    Past Medical History:  Diagnosis Date  . Arthritis    BOTH KNEES  . Asthma    uncomplicated  . Bronchitis    LAST TIME WAS JAN 2014  . Chronic insomnia 11/14/2015  . Chronic left shoulder pain 01/19/2016  . Elevated blood pressure reading   . History of kidney stones   . Morbid obesity (HCC) 11/14/2015  . New onset a-fib (HCC)   . Obstructive chronic bronchitis without exacerbation (HCC) 03/14/2008  . Osteoarthritis of knee 11/14/2015  . Other general symptoms and signs 02/29/2008  . PONV (postoperative nausea and vomiting)   . Sleep apnea    cpap wears at times not nightly     Past Surgical History:  Procedure Laterality Date  . BILATERAL KNEE ARTHROSCOPY    . CARDIOVERSION N/A 03/08/2017   Procedure: CARDIOVERSION;  Surgeon: [name], MD;  Location: [facility] ENDOSCOPY;  Service: Cardiovascular;  Laterality: N/A;  . PARTIAL AMPUTATION LEFT THUMB AND TENDON REPAIR    . TONSILLECTOMY AND ADENOIDECTOMY    . TOTAL KNEE ARTHROPLASTY Right 06/19/2012   Procedure: RIGHT TOTAL KNEE ARTHROPLASTY;  Surgeon: [name], MD;  Location: [facility];  Service: Orthopedics;  Laterality: Right;  . TOTAL KNEE REVISION Right 07/27/2017   Procedure: RIGHT KNEE POLYETHYLENE REVISION;  Surgeon: [name], MD;  Location: [facility];  Service: Orthopedics;  Laterality: Right;  Adductor Block     Current Outpatient Medications  Medication Sig Dispense Refill  . diltiazem (CARDIZEM CD) 120 MG 24 hr capsule Take 120 mg by mouth daily.  5  . flecainide (TAMBOCOR) 50 MG tablet Take 1.5 tablets (75 mg total) by mouth 2 (two) times daily. 90 tablet 11  . hydrochlorothiazide (MICROZIDE) 12.5 MG capsule Take 12.5 mg by mouth 2 (two) times daily.    . Ibuprofen-Diphenhydramine Cit (ADVIL PM PO) Take 3-4 tablets by mouth at bedtime as needed (for sleep/pain).     . methocarbamol (ROBAXIN) 500 MG tablet Take 1 tablet (500 mg total) by mouth every 6 (six) hours as needed for muscle spasms. 40 tablet 0  . metoprolol tartrate (LOPRESSOR) 25 MG tablet Take 1 tablet (25 mg total) by mouth 2 (two) times daily. 180 tablet 3  . oxyCODONE (OXY IR/ROXICODONE) 5 MG immediate release tablet Take 1-2 tablets (5-10 mg total) by mouth every 6 (six) hours as needed for moderate pain (pain score 4-6). 56 tablet 0  .  rivaroxaban (XARELTO) 20 MG TABS tablet Take 1 tablet (20 mg total) by mouth daily with supper. 90 tablet 3  . traMADol (ULTRAM) 50 MG tablet Take 1-2 tablets (50-100 mg total) by mouth every 6 (six) hours as needed for moderate pain (not responding to oxycodone). 40 tablet 0  . zolpidem (AMBIEN) 10 MG tablet Take 1 tablet (10 mg total) by mouth at bedtime. 30 tablet 5   No current facility-administered medications for this visit.     Allergies:   Morphine and related    Social History:  The patient  reports that he has never smoked. He has never used smokeless tobacco. He reports that he does not drink alcohol or use drugs.   Family History:  The patient's family  history includes Cancer in his mother; Cancer - Other in his father and paternal grandfather; Diabetes in his father and paternal grandfather; Heart disease in his father and paternal grandfather; Prostate cancer in his paternal grandfather.    ROS:  Please see the history of present illness.   Otherwise, review of systems are positive for none.   All other systems are reviewed and negative.    PHYSICAL EXAM: VS:  There were no vitals taken for this visit. , BMI There is no height or weight on file to calculate BMI. Affect appropriate Healthy:  appears stated age HEENT: normal Neck supple with no adenopathy JVP normal no bruits no thyromegaly Lungs clear with no wheezing and good diaphragmatic motion Heart:  S1/S2 no murmur, no rub, gallop or click PMI normal Abdomen: benighn, BS positve, no tenderness, no AAA no bruit.  No HSM or HJR Distal pulses intact with no bruits No edema Neuro non-focal Skin warm and dry No muscular weakness  Post right TKR ***    EKG:  06/01/17 afib rate 61 otherwise normal   Recent Labs: 07/21/2017: ALT 17 07/28/2017: BUN 13; Creatinine, Ser 0.85; Hemoglobin 13.5; Platelets 243; Potassium 4.3; Sodium 142    Lipid Panel No results found for: CHOL, TRIG, HDL, CHOLHDL, VLDL, LDLCALC, LDLDIRECT    Wt Readings from Last 3 Encounters:  09/14/17 (!) 308 lb (139.7 kg)  07/27/17 (!) 332 lb (150.6 kg)  07/23/17 (!) 332 lb (150.6 kg)      Other studies Reviewed: Additional studies/ records that were reviewed today include: [facility] labs ECG.    ASSESSMENT AND PLAN:  1.  Afib:  On beta blocker and xarelto failed DCC on 03/08/17 On flecainide 75 bid ETT done 06/17/17 with no pro arrhythmia or ischemia  DCC posponed due to right knee surgery done 07/28/17 *** 2. HTN Well controlled.  Continue current medications and low sodium Dash type diet.   3. Edema: from venous insuficiency and obesity with some skin breakdown and stasis Continue  diuretic  4. Ortho: post right TKR with revision done 07/28/17 PT/OT f/u Alusio Had ? Infection and effusion when I saw 09/14/17 and xarelto dose decreased *** 5. OSA:  Encouraged him to wear nightly and not " when I need it" Link to PAF discussed 6. Obesity:  Discussed bariatric surgery. Has lost weight in past with weight watchers.     [name]

## 2017-10-20 ENCOUNTER — Ambulatory Visit: Payer: Self-pay | Admitting: Cardiovascular Disease

## 2017-10-30 ENCOUNTER — Encounter: Payer: Self-pay | Admitting: Internal Medicine

## 2017-10-31 ENCOUNTER — Ambulatory Visit (INDEPENDENT_AMBULATORY_CARE_PROVIDER_SITE_OTHER): Payer: BLUE CROSS/BLUE SHIELD | Admitting: Internal Medicine

## 2017-10-31 ENCOUNTER — Encounter: Payer: Self-pay | Admitting: Internal Medicine

## 2017-10-31 VITALS — BP 110/74 | HR 83 | Ht 73.0 in | Wt 305.2 lb

## 2017-10-31 DIAGNOSIS — I4819 Other persistent atrial fibrillation: Secondary | ICD-10-CM

## 2017-10-31 DIAGNOSIS — I481 Persistent atrial fibrillation: Secondary | ICD-10-CM | POA: Diagnosis not present

## 2017-10-31 DIAGNOSIS — G4733 Obstructive sleep apnea (adult) (pediatric): Secondary | ICD-10-CM

## 2017-10-31 DIAGNOSIS — F5101 Primary insomnia: Secondary | ICD-10-CM

## 2017-10-31 MED ORDER — ZOLPIDEM TARTRATE 10 MG PO TABS: 10.0000 mg | ORAL_TABLET | Freq: Every day | ORAL | 5 refills | Status: DC

## 2017-10-31 NOTE — Patient Instructions (Addendum)
We can continue CPAP auto 4-20, mask of choice, humidifier, supplies, AirView  Ambien refilled  Don't forget that flu shot !  Please call if we can help

## 2017-10-31 NOTE — Progress Notes (Signed)
HPI male never smoker followed for OSA, insomnia, complicated by morbid obesity, asthma/bronchitis, PAFib/ Xarelto,  Previously seen at this office for asthmatic bronchitis LOV 2010  gets flu shot at work He complains of poor quality sleep/ Insomnia.. 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 Pt states he wears CPAP nightly for about 4-5 hours. Pt needs order for new supplies and nasal pillows. He says life is better with CPAP but offers various reasons why download compliance is less than desired-53%/4 hour. Control is good-AHI 1.7. Sometimes falls asleep before he puts it on. Uses an occasional Ambien. Says many nights sleep time is short, limited by his work schedule and work clear doing to build new home.  10/31/2017- 67 year old male never smoker followed for OSA, insomnia, complicated by morbid obesity, asthma/bronchitis, PAFib/ Xarelto,  CPAP auto 4-20/[facility] -----OSA; DME: [employer]. Pt wears CPAP nightly; DL attached. Pt will need order for new supplies-face mask at this time.  Weight today 305 pounds Download 83% compliance, AHI 2.7/ hr. To get flu vax at work. 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  Ambien 10 mg most nights-discussed.  ROS-see HPI   + = positive Constitutional:    weight loss, night sweats, fevers, chills, + fatigue,  lassitude. HEENT:    headaches, difficulty swallowing, tooth/dental problems, sore throat,       sneezing, itching, ear ache, nasal congestion, post nasal drip, snoring CV:    chest pain, orthopnea, PND, swelling in lower extremities, anasarca,                                                     dizziness, palpitations Resp:   shortness of breath with exertion or at rest.                productive cough,   non-productive cough, coughing up of blood.              change in color of mucus.  wheezing.   Skin:    rash or lesions. GI:  No-   heartburn, indigestion, abdominal pain, nausea, vomiting,  GU:  MS:   joint pain, stiffness, decreased range of motion, back pain. Neuro-     nothing unusual Psych:  change in mood or affect.  depression or anxiety.   memory loss.  OBJ- Physical Exam General- Alert, Oriented, Affect-appropriate, Distress- none acute, + overweight Skin- rash-none, lesions- none, excoriation- none Lymphadenopathy- none Head- atraumatic            Eyes- Gross vision intact, PERRLA, conjunctivae and secretions clear            Ears- Hearing, canals-normal            Nose- Clear, no-Septal dev, mucus, polyps, erosion, perforation             Throat- Mallampati IV, mucosa  clear , drainage- none, tonsils- atrophic Neck- flexible , trachea midline, no stridor , thyroid nl, carotid no bruit Chest - symmetrical excursion , unlabored           Heart/CV- RRR , no murmur , no gallop  , no rub, nl s1 s2                           - JVD- none , edema- none, stasis changes- none, varices- none           Lung- clear to P&A, wheeze- none, cough- none , dullness-none, rub- none           Chest wall-  Abd-  Br/ Gen/ Rectal- Not done, not indicated Extrem- cyanosis- none, clubbing, none, atrophy- none, strength- nl, R TKR scar Neuro- grossly intact to observation

## 2017-11-01 NOTE — Assessment & Plan Note (Signed)
Rhythm is very nearly regular and may be sinus rhythm at this visit

## 2017-11-01 NOTE — Assessment & Plan Note (Signed)
Discussion about sleep hygiene, sleep medications and potential problems. Plan-we will fill Ambien 10 mg this time.  Going forward consider change to trazodone or Lunesta.

## 2017-11-01 NOTE — Assessment & Plan Note (Signed)
Needs continued effort to maintain compliance goals but CPAP is working well and he benefits with improved sleep. Plan-continue CPAP auto 4-20

## 2017-11-14 ENCOUNTER — Other Ambulatory Visit: Payer: Self-pay | Admitting: Cardiovascular Disease

## 2017-11-14 ENCOUNTER — Other Ambulatory Visit: Payer: Self-pay | Admitting: *Deleted

## 2017-11-14 MED ORDER — RIVAROXABAN 20 MG PO TABS: 20.0000 mg | ORAL_TABLET | Freq: Every day | ORAL | 2 refills | Status: DC

## 2017-11-14 MED ORDER — METOPROLOL TARTRATE 25 MG PO TABS: 25.0000 mg | ORAL_TABLET | Freq: Two times a day (BID) | ORAL | 2 refills | Status: DC

## 2017-11-14 MED ORDER — HYDROCHLOROTHIAZIDE 12.5 MG PO CAPS: 12.5000 mg | ORAL_CAPSULE | Freq: Two times a day (BID) | ORAL | 2 refills | Status: DC

## 2017-11-14 MED ORDER — FLECAINIDE ACETATE 50 MG PO TABS: 75.0000 mg | ORAL_TABLET | Freq: Two times a day (BID) | ORAL | 2 refills | Status: DC

## 2017-11-14 NOTE — Telephone Encounter (Signed)
Pt last saw Dr Eden Emms 09/14/17, last labs 07/28/17 Creat 0.85, age 67, weight 138.4kg, CrCl 169.61, based on CrCl pt is on appropriate dosage of Xarelto 20mg  QD.  Will refill rx.

## 2017-12-22 ENCOUNTER — Other Ambulatory Visit: Payer: Self-pay | Admitting: Cardiovascular Disease

## 2018-01-01 NOTE — Progress Notes (Signed)
Cardiology Office Note   Date:  01/05/2018   ID:  Brent Dougherty, Brent Dougherty [birth year], MRN [id number]  PCP:  [name], MD  Cardiologist:   [name], MD   No chief complaint on file.     History of Present Illness:  67 y.o. first seen December 2018 for new onset afib. Had poorly controlled HTN Diet with lots of caffeine and Mountain Dew  Obese With OSA wears CPAP. CHAdVASC Score 2.   He was started on xarelto , beta blocker and diuretic   Echo reviewed 01/27/17 EF 55-60% normal atrial sizes   Labs reviewed 01/17/17   LDL 126 HDL 60 K 4.6 Cr .96 normal LFT;s Hct 44.6   Ultimately had DCC on 03/08/17 Failed to convert. Started on flecainide 06/01/17  ETT f/u normal   Cardioversion repeat posponed due to right TKR revision by [name] done 07/28/17 Complicated by effusion and ? Infection Unable to fully anticoagulate  He is still unhappy about the progress of his knee. Discussed scheduling [facility] and he seemed hesitant Concerned that his wife needs right shoulder surgery soon. Last [facility] his skin got "red" do to repeated Attempts at conversion that were not successful. Discussed the idea of trying again now that he is on AAT  Past Medical History:  Diagnosis Date  . Arthritis    BOTH KNEES  . Asthma    uncomplicated  . Bronchitis    LAST TIME WAS JAN 2014  . Chronic insomnia 11/14/2015  . Chronic left shoulder pain 01/19/2016  . Elevated blood pressure reading   . History of kidney stones   . Morbid obesity (HCC) 11/14/2015  . New onset a-fib (HCC)   . Obstructive chronic bronchitis without exacerbation (HCC) 03/14/2008  . Osteoarthritis of knee 11/14/2015  . Other general symptoms and signs 02/29/2008  . PONV (postoperative nausea and vomiting)   . Sleep apnea    cpap wears at times not nightly     Past Surgical History:  Procedure Laterality Date  . BILATERAL KNEE ARTHROSCOPY    . CARDIOVERSION N/A 03/08/2017   Procedure: CARDIOVERSION;  Surgeon: [name], MD;  Location: [facility] ENDOSCOPY;  Service: Cardiovascular;  Laterality: N/A;  . PARTIAL AMPUTATION LEFT THUMB AND TENDON REPAIR    . TONSILLECTOMY AND ADENOIDECTOMY    . TOTAL KNEE ARTHROPLASTY Right 06/19/2012   Procedure: RIGHT TOTAL KNEE ARTHROPLASTY;  Surgeon: [name], MD;  Location: [facility] ORS;  Service: Orthopedics;  Laterality: Right;  . TOTAL KNEE REVISION Right 07/27/2017   Procedure: RIGHT KNEE POLYETHYLENE REVISION;  Surgeon: [name], MD;  Location: [facility] ORS;  Service: Orthopedics;  Laterality: Right;  Adductor Block     Current Outpatient Medications  Medication Sig Dispense Refill  . diltiazem (CARDIZEM CD) 120 MG 24 hr capsule Take 120 mg by mouth daily.  5  . flecainide (TAMBOCOR) 50 MG tablet Take 1.5 tablets (75 mg total) by mouth 2 (two) times daily. 270 tablet 2  . hydrochlorothiazide (MICROZIDE) 12.5 MG capsule Take 1 capsule (12.5 mg total) by mouth 2 (two) times daily. 180 capsule 2  . Ibuprofen-Diphenhydramine Cit (ADVIL PM PO) Take 3-4 tablets by mouth at bedtime as needed (for sleep/pain).     . methocarbamol (ROBAXIN) 500 MG tablet Take 1 tablet (500 mg total) by mouth every 6 (six) hours as needed for muscle spasms. 40 tablet 0  . metoprolol tartrate (LOPRESSOR) 25 MG tablet Take 1 tablet (25 mg total) by mouth 2 (  two) times daily. 180 tablet 2  . rivaroxaban (XARELTO) 20 MG TABS tablet Take 1 tablet (20 mg total) by mouth daily with supper. 90 tablet 2  . zolpidem (AMBIEN) 10 MG tablet Take 1 tablet (10 mg total) by mouth at bedtime. 30 tablet 5   No current facility-administered medications for this visit.     Allergies:   Morphine and related    Social History:  The patient  reports that he has never smoked. He has never used smokeless tobacco. He reports that he does not drink alcohol or use drugs.   Family History:  The patient's family history includes Cancer in his mother; Cancer - Other in his father and paternal grandfather; Diabetes in his  father and paternal grandfather; Heart disease in his father and paternal grandfather; Prostate cancer in his paternal grandfather.    ROS:  Please see the history of present illness.   Otherwise, review of systems are positive for none.   All other systems are reviewed and negative.    PHYSICAL EXAM: VS:  BP 118/62   Pulse 62   Ht 6\' 1"  (1.854 m)   Wt 299 lb 12 oz (136 kg)   SpO2 97%   BMI 39.55 kg/m  , BMI Body mass index is 39.55 kg/m. Affect appropriate Obese male  HEENT: normal Neck supple with no adenopathy JVP normal no bruits no thyromegaly Lungs clear with no wheezing and good diaphragmatic motion Heart:  S1/S2 no murmur, no rub, gallop or click PMI normal Abdomen: benighn, BS positve, no tenderness, no AAA no bruit.  No HSM or HJR Distal pulses intact with no bruits No edema Neuro non-focal Skin warm and dry Post right TKR still warm to touch erythematous with effusion to my exam    EKG:  06/01/17 afib rate 61 otherwise normal   Recent Labs: 07/21/2017: ALT 17 07/28/2017: BUN 13; Creatinine, Ser 0.85; Hemoglobin 13.5; Platelets 243; Potassium 4.3; Sodium 142    Lipid Panel No results found for: CHOL, TRIG, HDL, CHOLHDL, VLDL, LDLCALC, LDLDIRECT    Wt Readings from Last 3 Encounters:  01/05/18 299 lb 12 oz (136 kg)  10/31/17 (!) 305 lb 3.2 oz (138.4 kg)  09/14/17 (!) 308 lb (139.7 kg)      Other studies Reviewed: Additional studies/ records that were reviewed today include: [facility] labs ECG.    ASSESSMENT AND PLAN:  1.  Afib:  On beta blocker and xarelto failed DCC on 03/08/17 start flecainide 75 bid ETT done 06/17/17 with no pro arrhythmia or ischemia  [facility]  And complications with effusion since then [facility] if patient is willing to have He will contact us 2. HTN: Well controlled.  Continue current medications and low sodium Dash type diet.     3. Edema:  from venous insuficiency and obesity with some skin breakdown and stasis Continue diuretic  4. Ortho: post right TKR with revision done 07/28/17 PT/OT f/u Alusio    5. OSA:  Encouraged him to wear nightly and not " when I need it" Link to PAF discussed 6. Obesity:  Discussed bariatric surgery. Has lost weight in past with weight watchers.     Charlton HawsPeter Talajah Slimp

## 2018-01-05 ENCOUNTER — Encounter: Payer: Self-pay | Admitting: Cardiovascular Disease

## 2018-01-05 ENCOUNTER — Ambulatory Visit (INDEPENDENT_AMBULATORY_CARE_PROVIDER_SITE_OTHER): Payer: BLUE CROSS/BLUE SHIELD | Admitting: Cardiovascular Disease

## 2018-01-05 VITALS — BP 118/62 | HR 62 | Ht 73.0 in | Wt 299.8 lb

## 2018-01-05 DIAGNOSIS — I10 Essential (primary) hypertension: Secondary | ICD-10-CM

## 2018-01-05 DIAGNOSIS — I480 Paroxysmal atrial fibrillation: Secondary | ICD-10-CM

## 2018-01-05 NOTE — Patient Instructions (Addendum)
Medication Instructions:   If you need a refill on your cardiac medications before your next appointment, please call your pharmacy.   Lab work:  If you have labs (blood work) drawn today and your tests are completely normal, you will receive your results only by: Marland Kitchen. MyChart Message (if you have MyChart) OR . A paper copy in the mail If you have any lab test that is abnormal or we need to change your treatment, we will call you to review the results.  Testing/Procedures: Please call our office and let us know which day will work for you, December 5th or 10th. Your physician has recommended that you have a Cardioversion (DCCV). Electrical Cardioversion uses a jolt of electricity to your heart either through paddles or wired patches attached to your chest. This is a controlled, usually prescheduled, procedure. Defibrillation is done under light anesthesia in the hospital, and you usually go home the day of the procedure. This is done to get your heart back into a normal rhythm. You are not awake for the procedure. Please see the instruction sheet given to you today.  Follow-Up: At [facility], you and your health needs are our priority.  As part of our continuing mission to provide you with exceptional heart care, we have created designated Provider Care Teams.  These Care Teams include your primary Cardiologist (physician) and Advanced Practice Providers (APPs -  Physician Assistants and Nurse Practitioners) who all work together to provide you with the care you need, when you need it.

## 2018-01-10 ENCOUNTER — Other Ambulatory Visit: Payer: Self-pay | Admitting: Cardiovascular Disease

## 2018-01-10 IMAGING — NM NM BONE 3 PHASE
10 series · 20 of 20 positions shown · non-contrast
Comparison: None

Radiographic correlation:  None available

CLINICAL DATA: RIGHT knee pain, fell onto RIGHT knee replacement on
04/12/2016 ; RIGHT knee arthroplasty performed on 06/20/2011

EXAM:
NUCLEAR MEDICINE 3-PHASE BONE SCAN
TECHNIQUE: Radionuclide angiographic images, immediate static blood pool
images, and 3-hour delayed static images were obtained of the knees
after intravenous injection of radiopharmaceutical.
RADIOPHARMACEUTICALS:  21.8 mCi Rc-ZZm MDP IV

[Series 1: flow · 2.07mm/px · 6 of 48 frames shown (1 of 2)]
[frame 5/48]
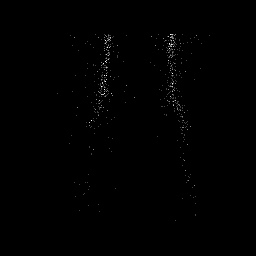
[frame 13/48  full-range]
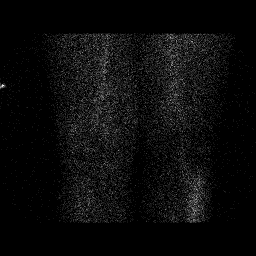
[frame 21/48  full-range]
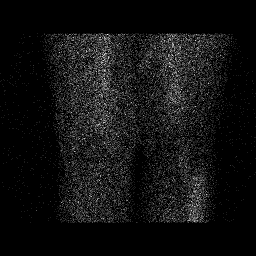
[frame 29/48  full-range]
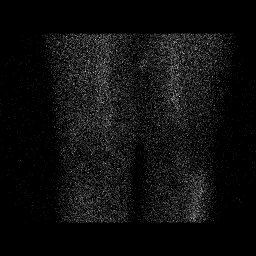
[frame 37/48  full-range]
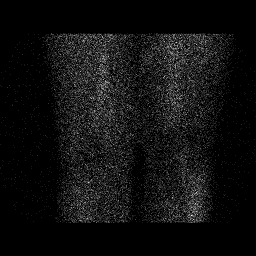
[frame 45/48  full-range]
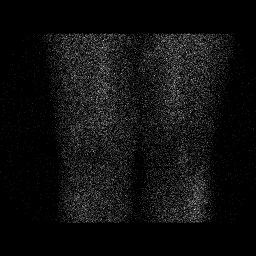

[Series 1: flow · 2.07mm/px · 6 of 48 frames shown (2 of 2)]
[frame 5/48]
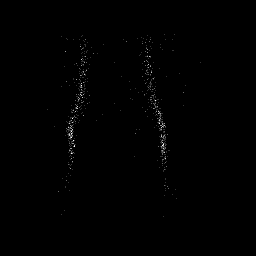
[frame 13/48  full-range]
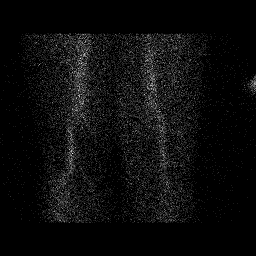
[frame 21/48  full-range]
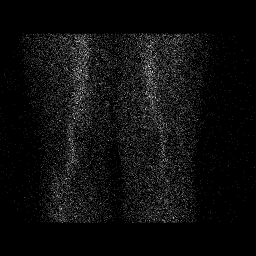
[frame 29/48  full-range]
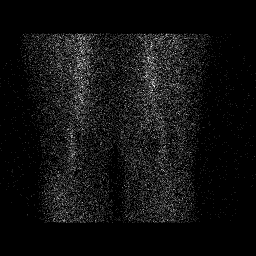
[frame 37/48  full-range]
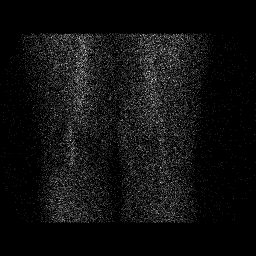
[frame 45/48  full-range]
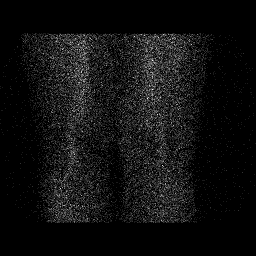

[Series 2: blood pool · 2.07mm/px · 1 of 1 slices shown (1 of 6)]
[im 1/1  full-range]
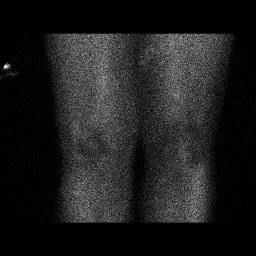

[Series 2: blood pool · 2.07mm/px · 1 of 1 slices shown (2 of 6)]
[im 1/1  full-range]
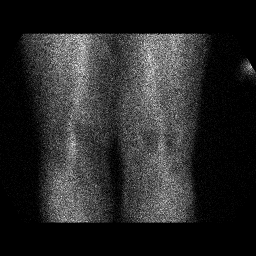

[Series 3: lat bp · 2.07mm/px · 1 of 1 slices shown (1 of 2)]
[im 1/1  full-range]
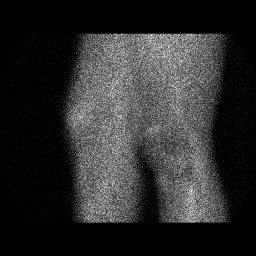

[Series 3: lat bp · 2.07mm/px · 1 of 1 slices shown (2 of 2)]
[im 1/1  full-range]
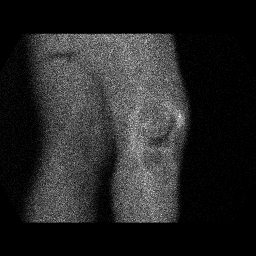

[Series 4: blood pool · 2.07mm/px · 1 of 1 slices shown (3 of 6)]
[im 1/1]
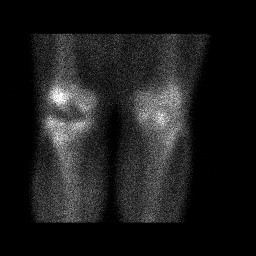

[Series 4: blood pool · 2.07mm/px · 1 of 1 slices shown (4 of 6)]
[im 1/1  full-range]
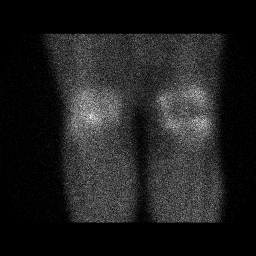

[Series 5: blood pool · 2.07mm/px · 1 of 1 slices shown (5 of 6)]
[im 1/1]
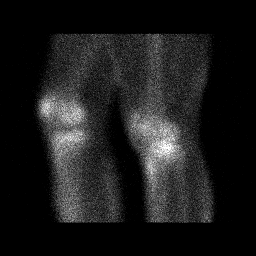

[Series 5: blood pool · 2.07mm/px · 1 of 1 slices shown (6 of 6)]
[im 1/1]
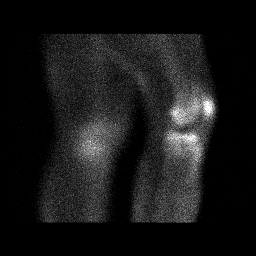

[20 of 20 positions shown; findings below may reference images not displayed]

FINDINGS: Vascular phase: Minimally increased blood flow to the lateral RIGHT
knee

Blood pool phase: Minimally increased blood pool at the lateral
RIGHT knee adjacent to the femoral component of the prosthesis and
needle RIGHT patella.

Delayed phase: Minimally increased delayed uptake of tracer at the
RIGHT patella. No additional focal abnormal tracer localization is
identified. Minimal uptake at the LEFT knee likely degenerative.
IMPRESSION: Minimally increased blood pool at the lateral knee anterior RIGHT
knee with minimal increase in delayed uptake of tracer at the RIGHT
patella; this is nonspecific and could be seen with patellar
contusion or synovitis, less likely with loosening of the patellar
disc.

No additional abnormal uptake of tracer adjacent to the femoral
tibial components of the RIGHT knee prosthesis to suggest loosening
or infection.

## 2018-01-25 ENCOUNTER — Other Ambulatory Visit: Payer: Self-pay | Admitting: Cardiovascular Disease

## 2018-01-25 NOTE — Telephone Encounter (Signed)
Pt is a 67 yr old male who saw Dr Eden EmmsNishan on 01/05/18, wt at that visit was 136Kg. SCr is 0.85 on 07/28/17. CrCl is 17364mL/min. Will refill Xarelto 20mg  QD.

## 2018-04-25 ENCOUNTER — Telehealth: Payer: Self-pay

## 2018-04-25 NOTE — Telephone Encounter (Signed)
We received a PA request for Xarelto via fax from [facility]. I have completed the form and placed it in Dr Fabio Bering mail bin awaiting his signature.

## 2018-04-28 NOTE — Telephone Encounter (Signed)
Dr Eden Emms has signed the Xarelto PA form and I have faxed it to [facility].

## 2018-05-02 NOTE — Telephone Encounter (Signed)
**Note De-Identified Paxson Harrower Obfuscation** Letter received from [facility] stating that they have approved the pts Xarelto PA. Approval good from 03/29/18 until 04/28/2019.   I have notified the pts pharmacy.

## 2018-05-26 ENCOUNTER — Other Ambulatory Visit: Payer: Self-pay | Admitting: Internal Medicine

## 2018-05-30 NOTE — Telephone Encounter (Signed)
Zolpidem refill e-sent 

## 2018-05-30 NOTE — Telephone Encounter (Signed)
CY please advise on refill for Zolpidem 5 mg.

## 2018-06-18 ENCOUNTER — Other Ambulatory Visit: Payer: Self-pay | Admitting: Cardiovascular Disease

## 2018-06-25 ENCOUNTER — Other Ambulatory Visit: Payer: Self-pay | Admitting: Cardiovascular Disease

## 2018-06-30 NOTE — Progress Notes (Signed)
Please place orders in Epic as patient is being scheduled for a pre-op appointment! Thank you! 

## 2018-07-05 ENCOUNTER — Telehealth: Payer: Self-pay

## 2018-07-05 NOTE — Telephone Encounter (Signed)
   [facility] Pre-operative Risk Assessment    Request for surgical clearance:  1. What type of surgery is being performed? Right knee I &D: poly exchange/choice   2. When is this surgery scheduled? 07/19/18  3. What type of clearance is required (medical clearance vs. Pharmacy clearance to hold med vs. Both)? BOTH  4. Are there any medications that need to be held prior to surgery and how long? xarelto   5. Practice name and name of physician performing surgery? [facility] Dr. Pilar Plate Aluisio  6. What is your office phone number [phone]    7.   What is your office fax number Attn: Glendale Chard [phone]  8.   Anesthesia type (None, local, MAC, general) ? Choice   [place] 07/05/2018, 11:13 AM  _________________________________________________________________

## 2018-07-05 NOTE — Telephone Encounter (Signed)
   Primary Cardiologist: [name], MD  Chart reviewed as part of pre-operative protocol coverage. Patient was contacted 07/05/2018 in reference to pre-operative risk assessment for pending surgery as outlined below.  Brent Dougherty was last seen on 01/05/2018 by Dr. Eden Emms.  Since that day, Brent Dougherty has done well. He has atrial fibrillation treated with flecainide. Stress test and echo were normal. Pt feels that he continues to be in afib as it limits his exercise tolerance somewhat. Dr. Eden Emms will see him the office after his knee procedure to further discuss heart rhythm treatment.   Therefore, based on ACC/AHA guidelines, the patient would be at acceptable risk for the planned procedure without further cardiovascular testing.   I will route this recommendation to the requesting party via Epic fax function and remove from pre-op pool.  Please call with questions.  [name], NP 07/05/2018, 12:46 PM

## 2018-07-05 NOTE — Telephone Encounter (Signed)
   Primary Cardiologist: [name], MD  Chart reviewed as part of pre-operative protocol coverage. Patient was contacted 07/05/2018 in reference to pre-operative risk assessment for pending surgery as outlined below.  Brent Dougherty was last seen on 01/05/2018 by Dr. Eden Emms.  Since that day, Brent Dougherty has done well. He has atrial fibrillation treated with flecainide. Stress test and echo were normal. Pt feels that he continues to be in afib as it limits his exercise tolerance somewhat. Dr. Eden Emms will see him the office after his knee procedure to further discuss heart rhythm treatment.   Therefore, based on ACC/AHA guidelines, the patient would be at acceptable risk for the planned procedure without further cardiovascular testing.          Patient with diagnosis of afib on Xarelto for anticoagulation.    Procedure: Right knee I &D: poly exchange/choice Date of procedure: 07/19/2018  CHADS2-VASc score of  2 (CHF, HTN, AGE, DM2, stroke/tia x 2, CAD, AGE, male)  CrCl 172ml/min  Per office protocol, patient can hold Xarelto for 2 days prior to procedure.          I will route this recommendation to the requesting party via Epic fax function and remove from pre-op pool.  Please call with questions.  [name], NP 07/05/2018, 12:46 PM

## 2018-07-05 NOTE — Telephone Encounter (Signed)
Please comment on xarelto. 

## 2018-07-05 NOTE — Telephone Encounter (Signed)
Patient with diagnosis of afib on Xarelto for anticoagulation.    Procedure: Right knee I &D: poly exchange/choice  Date of procedure: 07/19/2018  CHADS2-VASc score of  2 (CHF, HTN, AGE, DM2, stroke/tia x 2, CAD, AGE, male)  CrCl 166ml/min  Per office protocol, patient can hold Xarelto for 2 days prior to procedure.

## 2018-07-13 ENCOUNTER — Encounter (HOSPITAL_COMMUNITY): Payer: Self-pay

## 2018-07-13 NOTE — Pre-Procedure Instructions (Addendum)
The following are in epic: Cardiac clearance Ham[place] NP 07/05/2018 Last office visit 01/05/2018 Stress test 06/17/2017 ECHO 01/27/2017

## 2018-07-13 NOTE — Patient Instructions (Addendum)
DUE TO COVID-19 NO VISITORS ARE ALLOWED IN THE HOSPITAL AT THIS TIME   COVID SWAB TESTING MUST BE COMPLETED ON:  Monday, June 1 ,2020                (Must self quarantine after testing. Follow instructions on handout.)   Your procedure is scheduled on: Wednesday, July 19, 2018   Surgery Time:  4:45PM-5:45PM   Report to [facility] Main  Entrance    Report to admitting at 2:45 PM   Call this number if you have problems the morning of surgery [phone]   Do not eat food:After Midnight.            Complete one Ensure drink the morning of surgery at 4:30AM the day of surgery; No FOOD OR DRINK AFTER THIS.   Brush your teeth the morning of surgery.   Do NOT smoke after Midnight   Take these medicines the morning of surgery with A SIP OF WATER: Diltiazem, Flecainide, Metoprolol                               You may not have any metal on your body including jewelry, and body piercings             Do not wear lotions, powders, perfumes/cologne, or deodorant                           Men may shave face and neck.   Do not bring valuables to the hospital. [facility] IS NOT             RESPONSIBLE   FOR VALUABLES.   Contacts, dentures or bridgework may not be worn into surgery.   Bring small overnight bag day of surgery.   Special Instructions: Bring a copy of your healthcare power of attorney and living will documents         the day of surgery if you haven't scanned them in before.              Please read over the following fact sheets you were given:  [facility] - Preparing for Surgery Before surgery, you can play an important role.  Because skin is not sterile, your skin needs to be as free of germs as possible.  You can reduce the number of germs on your skin by washing with CHG (chlorahexidine gluconate) soap before surgery.  CHG is an antiseptic cleaner which kills germs and bonds with the skin to continue killing germs even after washing. Please DO NOT use if you  have an allergy to CHG or antibacterial soaps.  If your skin becomes reddened/irritated stop using the CHG and inform your nurse when you arrive at Short Stay. Do not shave (including legs and underarms) for at least 48 hours prior to the first CHG shower.  You may shave your face/neck.  Please follow these instructions carefully:  1.  Shower with CHG Soap the night before surgery and the  morning of surgery.  2.  If you choose to wash your hair, wash your hair first as usual with your normal  shampoo.  3.  After you shampoo, rinse your hair and body thoroughly to remove the shampoo.                             4.  Use CHG as you would any other liquid soap.  You can apply chg directly to the skin and wash.  Gently with a scrungie or clean washcloth.  5.  Apply the CHG Soap to your body ONLY FROM THE NECK DOWN.   Do   not use on face/ open                           Wound or open sores. Avoid contact with eyes, ears mouth and   genitals (private parts).                       Wash face,  Genitals (private parts) with your normal soap.             6.  Wash thoroughly, paying special attention to the area where your    surgery  will be performed.  7.  Thoroughly rinse your body with warm water from the neck down.  8.  DO NOT shower/wash with your normal soap after using and rinsing off the CHG Soap.                9.  Pat yourself dry with a clean towel.            10.  Wear clean pajamas.            11.  Place clean sheets on your bed the night of your first shower and do not  sleep with pets. Day of Surgery : Do not apply any lotions/deodorants the morning of surgery.  Please wear clean clothes to the hospital/surgery center.  FAILURE TO FOLLOW THESE INSTRUCTIONS MAY RESULT IN THE CANCELLATION OF YOUR SURGERY  PATIENT SIGNATURE_________________________________  NURSE  SIGNATURE__________________________________  ________________________________________________________________________   [name]  An incentive spirometer is a tool that can help keep your lungs clear and active. This tool measures how well you are filling your lungs with each breath. Taking long deep breaths may help reverse or decrease the chance of developing breathing (pulmonary) problems (especially infection) following:  A long period of time when you are unable to move or be active. BEFORE THE PROCEDURE   If the spirometer includes an indicator to show your best effort, your nurse or respiratory therapist will set it to a desired goal.  If possible, sit up straight or lean slightly forward. Try not to slouch.  Hold the incentive spirometer in an upright position. INSTRUCTIONS FOR USE  1. Sit on the edge of your bed if possible, or sit up as far as you can in bed or on a chair. 2. Hold the incentive spirometer in an upright position. 3. Breathe out normally. 4. Place the mouthpiece in your mouth and seal your lips tightly around it. 5. Breathe in slowly and as deeply as possible, raising the piston or the ball toward the top of the column. 6. Hold your breath for 3-5 seconds or for as long as possible. Allow the piston or ball to fall to the bottom of the column. 7. Remove the mouthpiece from your mouth and breathe out normally. 8. Rest for a few seconds and repeat Steps 1 through 7 at least 10 times every 1-2 hours when you are awake. Take your time and take a few normal breaths between deep breaths. 9. The spirometer may include an indicator to show your best effort. Use the indicator as a goal to work toward during each repetition. 10. After  each set of 10 deep breaths, practice coughing to be sure your lungs are clear. If you have an incision (the cut made at the time of surgery), support your incision when coughing by placing a pillow or rolled up towels firmly  against it. Once you are able to get out of bed, walk around indoors and cough well. You may stop using the incentive spirometer when instructed by your caregiver.  RISKS AND COMPLICATIONS  Take your time so you do not get dizzy or light-headed.  If you are in pain, you may need to take or ask for pain medication before doing incentive spirometry. It is harder to take a deep breath if you are having pain. AFTER USE  Rest and breathe slowly and easily.  It can be helpful to keep track of a log of your progress. Your caregiver can provide you with a simple table to help with this. If you are using the spirometer at home, follow these instructions: SEEK MEDICAL CARE IF:   You are having difficultly using the spirometer.  You have trouble using the spirometer as often as instructed.  Your pain medication is not giving enough relief while using the spirometer.  You develop fever of 100.5 F (38.1 C) or higher. SEEK IMMEDIATE MEDICAL CARE IF:   You cough up bloody sputum that had not been present before.  You develop fever of 102 F (38.9 C) or greater.  You develop worsening pain at or near the incision site. MAKE SURE YOU:   Understand these instructions.  Will watch your condition.  Will get help right away if you are not doing well or get worse. Document Released: 06/14/2006 Document Revised: 04/26/2011 Document Reviewed: 08/15/2006 Exit[employer] Patient Information 2014 [employer], [place].   ________________________________________________________________________  WHAT IS A BLOOD TRANSFUSION? Blood Transfusion Information  A transfusion is the replacement of blood or some of its parts. Blood is made up of multiple cells which provide different functions.  Red blood cells carry oxygen and are used for blood loss replacement.  White blood cells fight against infection.  Platelets control bleeding.  Plasma helps clot blood.  Other blood products are available for  specialized needs, such as hemophilia or other clotting disorders. BEFORE THE TRANSFUSION  Who gives blood for transfusions?   Healthy volunteers who are fully evaluated to make sure their blood is safe. This is blood bank blood. Transfusion therapy is the safest it has ever been in the practice of medicine. Before blood is taken from a donor, a complete history is taken to make sure that person has no history of diseases nor engages in risky social behavior (examples are intravenous drug use or sexual activity with multiple partners). The donor's travel history is screened to minimize risk of transmitting infections, such as malaria. The donated blood is tested for signs of infectious diseases, such as HIV and hepatitis. The blood is then tested to be sure it is compatible with you in order to minimize the chance of a transfusion reaction. If you or a relative donates blood, this is often done in anticipation of surgery and is not appropriate for emergency situations. It takes many days to process the donated blood. RISKS AND COMPLICATIONS Although transfusion therapy is very safe and saves many lives, the main dangers of transfusion include:   Getting an infectious disease.  Developing a transfusion reaction. This is an allergic reaction to something in the blood you were given. Every precaution is taken to prevent this. The decision to  have a blood transfusion has been considered carefully by your caregiver before blood is given. Blood is not given unless the benefits outweigh the risks. AFTER THE TRANSFUSION  Right after receiving a blood transfusion, you will usually feel much better and more energetic. This is especially true if your red blood cells have gotten low (anemic). The transfusion raises the level of the red blood cells which carry oxygen, and this usually causes an energy increase.  The nurse administering the transfusion will monitor you carefully for complications. HOME CARE  INSTRUCTIONS  No special instructions are needed after a transfusion. You may find your energy is better. Speak with your caregiver about any limitations on activity for underlying diseases you may have. SEEK MEDICAL CARE IF:   Your condition is not improving after your transfusion.  You develop redness or irritation at the intravenous (IV) site. SEEK IMMEDIATE MEDICAL CARE IF:  Any of the following symptoms occur over the next 12 hours:  Shaking chills.  You have a temperature by mouth above 102 F (38.9 C), not controlled by medicine.  Chest, back, or muscle pain.  People around you feel you are not acting correctly or are confused.  Shortness of breath or difficulty breathing.  Dizziness and fainting.  You get a rash or develop hives.  You have a decrease in urine output.  Your urine turns a dark color or changes to pink, red, or brown. Any of the following symptoms occur over the next 10 days:  You have a temperature by mouth above 102 F (38.9 C), not controlled by medicine.  Shortness of breath.  Weakness after normal activity.  The white part of the eye turns yellow (jaundice).  You have a decrease in the amount of urine or are urinating less often.  Your urine turns a dark color or changes to pink, red, or brown. Document Released: 01/30/2000 Document Revised: 04/26/2011 Document Reviewed: 09/18/2007 [facility] Patient Information 2014 [place], [place].  _______________________________________________________________________

## 2018-07-14 ENCOUNTER — Other Ambulatory Visit: Payer: Self-pay

## 2018-07-14 ENCOUNTER — Encounter (HOSPITAL_COMMUNITY)
Admission: RE | Admit: 2018-07-14 | Discharge: 2018-07-14 | Disposition: A | Discharge status: Home / Self Care | Payer: No Typology Code available for payment source | Source: Ambulatory Visit | Attending: Orthopedic Surgery | Admitting: Orthopedic Surgery

## 2018-07-14 ENCOUNTER — Encounter (HOSPITAL_COMMUNITY): Payer: Self-pay

## 2018-07-14 DIAGNOSIS — Z01818 Encounter for other preprocedural examination: Secondary | ICD-10-CM | POA: Insufficient documentation

## 2018-07-14 HISTORY — DX: Edema, unspecified: R60.9

## 2018-07-14 HISTORY — DX: Cardiac arrhythmia, unspecified: I49.9

## 2018-07-14 LAB — CBC
HCT: 47.9 % (ref 39.0–52.0)
Hemoglobin: 15.5 g/dL (ref 13.0–17.0)
MCH: 29.2 pg (ref 26.0–34.0)
MCHC: 32.4 g/dL (ref 30.0–36.0)
MCV: 90.4 fL (ref 80.0–100.0)
Platelets: 358 10*3/uL (ref 150–400)
RBC: 5.3 MIL/uL (ref 4.22–5.81)
RDW: 13 % (ref 11.5–15.5)
WBC: 7.6 10*3/uL (ref 4.0–10.5)
nRBC: 0 % (ref 0.0–0.2)

## 2018-07-14 LAB — COMPREHENSIVE METABOLIC PANEL
ALT: 16 U/L (ref 0–44)
AST: 20 U/L (ref 15–41)
Albumin: 4.1 g/dL (ref 3.5–5.0)
Alkaline Phosphatase: 74 U/L (ref 38–126)
Anion gap: 7 (ref 5–15)
BUN: 13 mg/dL (ref 8–23)
CO2: 29 mmol/L (ref 22–32)
Calcium: 9.6 mg/dL (ref 8.9–10.3)
Chloride: 100 mmol/L (ref 98–111)
Creatinine, Ser: 0.9 mg/dL (ref 0.61–1.24)
GFR calc Af Amer: >60 mL/min (ref >60)
GFR calc non Af Amer: >60 mL/min (ref >60)
Glucose, Bld: 97 mg/dL (ref 70–99)
Potassium: 4.5 mmol/L (ref 3.5–5.1)
Sodium: 136 mmol/L (ref 135–145)
Total Bilirubin: 0.6 mg/dL (ref 0.3–1.2)
Total Protein: 8.2 g/dL — ABNORMAL HIGH (ref 6.5–8.1)

## 2018-07-14 LAB — PROTIME-INR
INR: 1.4 — ABNORMAL HIGH (ref 0.8–1.2)
Prothrombin Time: 17.3 seconds — ABNORMAL HIGH (ref 11.4–15.2)

## 2018-07-14 LAB — SURGICAL PCR SCREEN
MRSA, PCR: NEGATIVE
Staphylococcus aureus: NEGATIVE

## 2018-07-14 LAB — APTT: aPTT: 36 seconds (ref 24–36)

## 2018-07-14 MED ORDER — MUPIROCIN 2 % EX OINT
1.0000 "application " | TOPICAL_OINTMENT | Freq: Two times a day (BID) | CUTANEOUS | Status: DC
  Filled 2018-07-14: qty 22

## 2018-07-17 ENCOUNTER — Other Ambulatory Visit (HOSPITAL_COMMUNITY)
Admission: RE | Admit: 2018-07-17 | Discharge: 2018-07-17 | Disposition: A | Discharge status: Home / Self Care | Payer: BLUE CROSS/BLUE SHIELD | Source: Ambulatory Visit | Attending: Orthopedic Surgery | Admitting: Orthopedic Surgery

## 2018-07-17 ENCOUNTER — Other Ambulatory Visit: Payer: Self-pay

## 2018-07-17 DIAGNOSIS — Z1159 Encounter for screening for other viral diseases: Secondary | ICD-10-CM | POA: Diagnosis present

## 2018-07-17 NOTE — H&P (Signed)
TOTAL KNEE ADMISSION H&P  Patient is being admitted for right knee irrigation and debridement with polyethylene exchange  Subjective:  Chief Complaint: Prosthetic joint infection, right knee  HPI: Brent Dougherty is a 68 year old male who presents for pre-operative visit in preparation for their right knee I&D with poly exchange that is scheduled on 07-19-2018 with Dr. Lequita Halt at [facility]. He has a diagnosis of prosthetic joint infection. The right knee was aspirated in the office on 06/22/2018, fluid culture came back positive for scant growth of Enterobacter cloacae complex. He is currently taking ciprofloxacin 500 mg BID. History of right TKA polyethylene exchange on 07/27/2017 with recurrent effusions occurring afterwards.   Patient Active Problem List   Diagnosis Date Noted  . Failed total knee arthroplasty (HCC) 07/27/2017  . PONV (postoperative nausea and vomiting)   . Persistent atrial fibrillation   . History of kidney stones   . Elevated blood pressure reading   . Bronchitis   . Asthma   . Arthritis   . Chronic left shoulder pain 01/19/2016  . Osteoarthritis of knee 11/14/2015  . Morbid obesity (HCC) 11/14/2015  . Chronic insomnia 11/14/2015  . Obstructive sleep apnea 12/06/2014  . Obesity, morbid (HCC) 11/05/2014  . Insomnia 11/05/2014  . OA (osteoarthritis) of knee 06/19/2012  . Obstructive chronic bronchitis without exacerbation (HCC) 03/14/2008  . OTHER GENERAL SYMPTOMS 02/29/2008  . Other general symptoms and signs 02/29/2008   Past Medical History:  Diagnosis Date  . Arthritis    BOTH KNEES  . Asthma    uncomplicated  . Atrial fibrillation (HCC) 01/2017  . Bronchitis    LAST TIME WAS JAN 2014  . Chronic insomnia 11/14/2015  . Chronic left shoulder pain 01/19/2016  . Dysrhythmia   . Edema   . Elevated blood pressure reading   . Family history of adverse reaction to anesthesia    grandson has nausea and vomiting   . History of blood transfusion    . History of kidney stones   . Morbid obesity (HCC) 11/14/2015  . Obstructive chronic bronchitis without exacerbation (HCC) 03/14/2008  . Osteoarthritis of knee 11/14/2015  . Other general symptoms and signs 02/29/2008  . PONV (postoperative nausea and vomiting)   . Sleep apnea    cpap wears at times not nightly     Past Surgical History:  Procedure Laterality Date  . BILATERAL KNEE ARTHROSCOPY    . CARDIOVERSION N/A 03/08/2017   Procedure: CARDIOVERSION;  Surgeon: [name], MD;  Location: [facility] ENDOSCOPY;  Service: Cardiovascular;  Laterality: N/A;  . PARTIAL AMPUTATION LEFT THUMB AND TENDON REPAIR    . TONSILLECTOMY AND ADENOIDECTOMY    . TOTAL KNEE ARTHROPLASTY Right 06/19/2012   Procedure: RIGHT TOTAL KNEE ARTHROPLASTY;  Surgeon: [name], MD;  Location: [facility] ORS;  Service: Orthopedics;  Laterality: Right;  . TOTAL KNEE REVISION Right 07/27/2017   Procedure: RIGHT KNEE POLYETHYLENE REVISION;  Surgeon: [name], MD;  Location: [facility] ORS;  Service: Orthopedics;  Laterality: Right;  Adductor Block    No current facility-administered medications for this encounter.    Current Outpatient Medications  Medication Sig Dispense Refill Last Dose  . ciprofloxacin (CIPRO) 500 MG tablet Take 500 mg by mouth 2 (two) times a day.     . diltiazem (CARDIZEM CD) 120 MG 24 hr capsule Take 120 mg by mouth daily.  5 Taking  . diphenhydramine-acetaminophen (TYLENOL PM) 25-500 MG TABS tablet Take 3 tablets by mouth at bedtime as needed (sleep).     Marland Kitchen  flecainide (TAMBOCOR) 50 MG tablet TAKE ONE AND ONE-HALF TABLETS TWICE A DAY (Patient taking differently: Take 75 mg by mouth 2 (two) times daily. ) 270 tablet 0   . hydrochlorothiazide (MICROZIDE) 12.5 MG capsule TAKE 1 CAPSULE TWICE A DAY (Patient taking differently: Take 12.5 mg by mouth 2 (two) times daily. ) 180 capsule 0   . methocarbamol (ROBAXIN) 500 MG tablet Take 1 tablet (500 mg total) by mouth every 6 (six) hours as needed for muscle  spasms. 40 tablet 0 Taking  . metoprolol tartrate (LOPRESSOR) 25 MG tablet TAKE 1 TABLET TWICE A DAY (Patient taking differently: Take 25 mg by mouth 2 (two) times daily. ) 180 tablet 1   . XARELTO 20 MG TABS tablet TAKE 1 TABLET DAILY WITH SUPPER (Patient taking differently: Take 20 mg by mouth daily with supper. ) 90 tablet 2   . zolpidem (AMBIEN) 10 MG tablet 1/2 or 1 tab at bedtime for sleep as needed (Patient taking differently: Take 10 mg by mouth every evening. ) 30 tablet 5    Allergies  Allergen Reactions  . Morphine And Related Other (See Comments)    MILD NAUSEA    Social History   Tobacco Use  . Smoking status: Never Smoker  . Smokeless tobacco: Never Used  Substance Use Topics  . Alcohol use: No    Family History  Problem Relation Age of Onset  . Heart disease Father   . Diabetes Father   . Cancer - Other Father        SKIN  . Cancer Mother        BRAIN  . Heart disease Paternal Grandfather   . Diabetes Paternal Grandfather   . Prostate cancer Paternal Grandfather   . Cancer - Other Paternal Grandfather        SKIN  . Colon cancer Neg Hx   . Colon polyps Neg Hx   . Esophageal cancer Neg Hx   . Rectal cancer Neg Hx   . Stomach cancer Neg Hx      Review of Systems  Constitutional: Negative for chills and fever.  HENT: Negative for congestion, sore throat and tinnitus.   Eyes: Negative for double vision, photophobia and pain.  Respiratory: Negative for cough, shortness of breath and wheezing.   Cardiovascular: Negative for chest pain, palpitations and orthopnea.  Gastrointestinal: Negative for heartburn, nausea and vomiting.  Genitourinary: Negative for dysuria, frequency and urgency.  Musculoskeletal: Positive for joint pain.  Neurological: Negative for dizziness, weakness and headaches.    Objective:  Physical Exam  Well nourished and well developed.  General: Alert and oriented x3, cooperative and pleasant, no acute distress.  Head: normocephalic,  atraumatic, neck supple.  Eyes: EOMI.  Respiratory: breath sounds clear in all fields, no wheezing, rales, or rhonchi. Cardiovascular: Regular rate and rhythm, no murmurs, gallops or rubs.  Abdomen: non-tender to palpation and soft, normoactive bowel sounds. Musculoskeletal:  Right knee exam: Well healed scars from his arthroplasty revision.  No erythema. No warmth.  Small effusion.  No swelling.  Range of motion: 0 - 125 degrees. Knee is stable.  Calves soft and nontender. Motor function intact in LE. Strength 5/5 LE bilaterally. Neuro: Distal pulses 2+. Sensation to light touch intact in LE.  Vital signs in last 24 hours:  Blood pressure: 154/86 mmHg   Labs:   Estimated body mass index is 42.61 kg/m as calculated from the following:   Height as of 07/14/18: 6\' 1"  (1.854 m).   Weight  as of 07/14/18: 146.5 kg.  Assessment/Plan:  Prosthetic joint infection, right knee  The patient history, physical examination, clinical judgment of the provider and imaging studies are consistent with prosthetic joint infection, right knee(s) and I&D with polyethylene exchange is deemed medically necessary. The treatment options including medical management, injection therapy arthroscopy and arthroplasty were discussed at length. The risks and benefits of total knee arthroplasty were presented and reviewed. The risks due to aseptic loosening, infection, stiffness, patella tracking problems, thromboembolic complications and other imponderables were discussed. The patient acknowledged the explanation, agreed to proceed with the plan and consent was signed. Patient is being admitted for inpatient treatment for surgery, pain control, PT, OT, prophylactic antibiotics, VTE prophylaxis, progressive ambulation and ADL's and discharge planning. The patient is planning to be discharged home.  Therapy Plans: outpatient therapy at [facility] in [place] Disposition: Home with wife Planned DVT Prophylaxis: Xarelto  20 mg daily (hx atrial fibrillation) DME needed: None PCP: Benedetto Goad, MD Cardiologist: [name], MD TXA: IV Allergies: NKDA Anesthesia Concerns: Nausea/vomiting (required scopolamine patch last sx) BMI: 39.8  - Patient was instructed on what medications to stop prior to surgery. - Follow-up visit in 2 weeks with Dr. Lequita Halt - Begin physical therapy following surgery - Pre-operative lab work as pre-surgical testing - Prescriptions will be provided in hospital at time of discharge  Arther Abbott, PA-C Orthopedic Surgery [facility] [place]

## 2018-07-17 NOTE — Anesthesia Preprocedure Evaluation (Addendum)
Anesthesia Evaluation  Patient identified by MRN, date of birth, ID band Patient awake    Reviewed: Allergy & Precautions, NPO status , Patient's Chart, lab work & pertinent test results  History of Anesthesia Complications (+) PONV and history of anesthetic complications  Airway Mallampati: III  TM Distance: >3 FB Neck ROM: Full    Dental no notable dental hx.    Pulmonary asthma , sleep apnea and Continuous Positive Airway Pressure Ventilation , COPD,    Pulmonary exam normal        Cardiovascular Normal cardiovascular exam+ dysrhythmias (on Xarelto, last dose 07/15/18) Atrial Fibrillation      Neuro/Psych negative neurological ROS     GI/Hepatic negative GI ROS, Neg liver ROS,   Endo/Other  negative endocrine ROS  Renal/GU negative Renal ROS     Musculoskeletal  (+) Arthritis , Right knee PJI   Abdominal   Peds  Hematology negative hematology ROS (+)   Anesthesia Other Findings Day of surgery medications reviewed with the patient.  Reproductive/Obstetrics                            Anesthesia Physical Anesthesia Plan  ASA: III  Anesthesia Plan: Spinal   Post-op Pain Management:  Regional for Post-op pain   Induction:   PONV Risk Score and Plan: 3 and Treatment may vary due to age or medical condition, Ondansetron, Midazolam, Scopolamine patch - Pre-op and Dexamethasone  Airway Management Planned: Natural Airway and Nasal Cannula  Additional Equipment:   Intra-op Plan:   Post-operative Plan:   Informed Consent: I have reviewed the patients History and Physical, chart, labs and discussed the procedure including the risks, benefits and alternatives for the proposed anesthesia with the patient or authorized representative who has indicated his/her understanding and acceptance.     Dental advisory given  Plan Discussed with: CRNA  Anesthesia Plan Comments: (See PAT note  07/14/2018, Jodell Cipro, PA-C)      Anesthesia Quick Evaluation

## 2018-07-17 NOTE — Progress Notes (Signed)
Anesthesia Chart Review   Case:  [id number] Date/Time:  07/19/18 1630   Procedure:  IRRIGATION AND DEBRIDEMENT KNEE WITH POLY EXCHANGE (Right )   Anesthesia type:  Choice   Pre-op diagnosis:  infected right total knee arthroplasty   Location:  [facility] ROOM 10 / [facility]   Surgeon:  [name], MD      DISCUSSION: 68 yo never smoker with h/o PONV, asthmatic bronchitis, sleep apnea w/CPAP, A-fib, infected right total knee arthroplasty scheduled for above procedure 07/19/2018 with Dr. [name].   Pt cleared by cardiology 07/05/2018.  Per [name], NP, "Brent Critchley Marshallwas last seen on 11/21/2019by Dr. Eden Emms. Since that day, Brent Dougherty done well.He has atrial fibrillation treated with flecainide. Stress test and echo were normal. Pt feels that he continues to be in afib as it limits his exercise tolerance somewhat. Dr. Eden Emms will see him the office after his knee procedure to further discuss heart rhythm treatment.Therefore, based on ACC/AHA guidelines, the patient would be at acceptable risk for the planned procedure without further cardiovascular testing. Per office protocol, patient can holdXareltofor 2days prior to procedure."  Pt can proceed with planned procedure barring acute status change.  VS: BP (!) 123/99   Pulse 78   Temp 37.1 C (Oral)   Resp 16   Ht 6\' 1"  (1.854 m)   Wt (!) 146.5 kg   SpO2 99%   BMI 42.61 kg/m   PROVIDERS: [name], MD  Is PCP   [name], MD is Cardiologist   Jetty Duhamel, MD is Pulmonologist last seen 10/31/17 LABS: Labs reviewed: Acceptable for surgery. (all labs ordered are listed, but only abnormal results are displayed)  Labs Reviewed  COMPREHENSIVE METABOLIC PANEL - Abnormal; Notable for the following components:      Result Value   Total Protein 8.2 (*)    All other components within normal limits  PROTIME-INR - Abnormal; Notable for the following components:   Prothrombin Time 17.3 (*)    INR 1.4 (*)    All other components within normal limits  SURGICAL PCR SCREEN  APTT  CBC  TYPE AND SCREEN     IMAGES:   EKG: 07/14/2018 Rate 68 bpm Atrial fibrillation  Replaced NSR since previous tracing 06/13/12 Left axis deviation is new  CV: Stress Test 06/17/2017  Blood pressure demonstrated a hypertensive response to exercise.  There was no ST segment deviation noted during stress.  Exercise capacity moderately impaired.  No ischemic changes noted.  Target heart rate nearly achieved (81%). Poor exercise tolerance but normal ETT  Echo 01/27/2017 Study Conclusions  - Left ventricle: The cavity size was normal. Wall thickness was   normal. Systolic function was normal. The estimated ejection   fraction was in the range of 55% to 60%. Wall motion was normal;   there were no regional wall motion abnormalities. - Aortic valve: Mildly calcified annulus.  Impressions:  - technically difficult echo with generally poor image quality.   Definity contrast was used to assess the LV function . Past Medical History:  Diagnosis Date  . Arthritis    BOTH KNEES  . Asthma    uncomplicated  . Atrial fibrillation (HCC) 01/2017  . Bronchitis    LAST TIME WAS JAN 2014  . Chronic insomnia 11/14/2015  . Chronic left shoulder pain 01/19/2016  . Dysrhythmia   . Edema   . Elevated blood pressure reading   . Family history of adverse reaction to anesthesia    grandson  has nausea and vomiting   . History of blood transfusion   . History of kidney stones   . Morbid obesity (HCC) 11/14/2015  . Obstructive chronic bronchitis without exacerbation (HCC) 03/14/2008  . Osteoarthritis of knee 11/14/2015  . Other general symptoms and signs 02/29/2008  . PONV (postoperative nausea and vomiting)   . Sleep apnea    cpap wears at times not nightly     Past Surgical History:  Procedure Laterality Date  . BILATERAL KNEE ARTHROSCOPY    . CARDIOVERSION N/A 03/08/2017   Procedure: CARDIOVERSION;   Surgeon: Wendall StadeNishan, Peter C, MD;  Location[facility];  Service: Cardiovascular;  Laterality: N/A;  . PARTIAL AMPUTATION LEFT THUMB AND TENDON REPAIR    . TONSILLECTOMY AND ADENOIDECTOMY    . TOTAL KNEE ARTHROPLASTY Right 06/19/2012   Procedure: RIGHT TOTAL KNEE ARTHROPLASTY;  Surgeon: [name], MD;  Location: [facility];  Service: Orthopedics;  Laterality: Right;  . TOTAL KNEE REVISION Right 07/27/2017   Procedure: RIGHT KNEE POLYETHYLENE REVISION;  Surgeon: Ollen GrossAluisio, Frank, MD;  Location: [facility];  Service: Orthopedics;  Laterality: Right;  Adductor Block    MEDICATIONS: . ciprofloxacin (CIPRO) 500 MG tablet  . diltiazem (CARDIZEM CD) 120 MG 24 hr capsule  . diphenhydramine-acetaminophen (TYLENOL PM) 25-500 MG TABS tablet  . flecainide (TAMBOCOR) 50 MG tablet  . hydrochlorothiazide (MICROZIDE) 12.5 MG capsule  . methocarbamol (ROBAXIN) 500 MG tablet  . metoprolol tartrate (LOPRESSOR) 25 MG tablet  . XARELTO 20 MG TABS tablet  . zolpidem (AMBIEN) 10 MG tablet   No current facility-administered medications for this encounter.    Janey GentaJessica Muaad Boehning, PA-C [facility] [phone] 07/17/18 1:13 PM

## 2018-07-18 MED ORDER — BUPIVACAINE LIPOSOME 1.3 % IJ SUSP
20.0000 mL | Freq: Once | INTRAMUSCULAR | Status: DC
  Filled 2018-07-18: qty 20

## 2018-07-18 NOTE — Progress Notes (Signed)
SPOKE W/  _ WIFE      SCREENING SYMPTOMS OF COVID 19:   COUGH--NO  RUNNY NOSE--- NO  SORE THROAT---NO  NASAL CONGESTION----NO  SNEEZING----NO  SHORTNESS OF BREATH---NO  DIFFICULTY BREATHING---NO  TEMP >100.0 -----NO  UNEXPLAINED BODY ACHES------NO  CHILLS -------- NO  HEADACHES ---------NO  LOSS OF SMELL/ TASTE --------NO    HAVE YOU OR ANY FAMILY MEMBER TRAVELLED PAST 14 DAYS OUT OF THE   COUNTY---NO STATE----NO COUNTRY----NO  HAVE YOU OR ANY FAMILY MEMBER BEEN EXPOSED TO ANYONE WITH COVID 19? NO

## 2018-07-18 NOTE — Progress Notes (Signed)
Spoke with patient aware arrive 1230 pm 07-19-18 [facility] 07-19-18

## 2018-07-19 ENCOUNTER — Encounter (HOSPITAL_COMMUNITY): Payer: Self-pay | Admitting: General Practice

## 2018-07-19 ENCOUNTER — Inpatient Hospital Stay (HOSPITAL_COMMUNITY): Payer: No Typology Code available for payment source | Admitting: Certified Registered Nurse Anesthetist

## 2018-07-19 ENCOUNTER — Inpatient Hospital Stay (HOSPITAL_COMMUNITY): Payer: No Typology Code available for payment source | Admitting: Physician Assistant

## 2018-07-19 ENCOUNTER — Other Ambulatory Visit: Payer: Self-pay

## 2018-07-19 ENCOUNTER — Encounter (HOSPITAL_COMMUNITY)
Admission: RE | Disposition: A | Discharge status: Home / Self Care | Payer: Self-pay | Source: Home / Self Care | Attending: Orthopedic Surgery

## 2018-07-19 ENCOUNTER — Inpatient Hospital Stay (HOSPITAL_COMMUNITY)
Admission: RE | Admit: 2018-07-19 | Discharge: 2018-07-21 | DRG: 467 | Disposition: A | Discharge status: Home / Self Care | Payer: No Typology Code available for payment source | Attending: Orthopedic Surgery | Admitting: Orthopedic Surgery

## 2018-07-19 ENCOUNTER — Telehealth (HOSPITAL_COMMUNITY): Payer: Self-pay | Admitting: *Deleted

## 2018-07-19 DIAGNOSIS — T8453XA Infection and inflammatory reaction due to internal right knee prosthesis, initial encounter: Secondary | ICD-10-CM | POA: Diagnosis present

## 2018-07-19 DIAGNOSIS — Z7901 Long term (current) use of anticoagulants: Secondary | ICD-10-CM | POA: Diagnosis not present

## 2018-07-19 DIAGNOSIS — Z6841 Body Mass Index (BMI) 40.0 and over, adult: Secondary | ICD-10-CM

## 2018-07-19 DIAGNOSIS — Z79899 Other long term (current) drug therapy: Secondary | ICD-10-CM

## 2018-07-19 DIAGNOSIS — J449 Chronic obstructive pulmonary disease, unspecified: Secondary | ICD-10-CM | POA: Diagnosis present

## 2018-07-19 DIAGNOSIS — Y831 Surgical operation with implant of artificial internal device as the cause of abnormal reaction of the patient, or of later complication, without mention of misadventure at the time of the procedure: Secondary | ICD-10-CM | POA: Diagnosis present

## 2018-07-19 DIAGNOSIS — I4819 Other persistent atrial fibrillation: Secondary | ICD-10-CM | POA: Diagnosis present

## 2018-07-19 DIAGNOSIS — M009 Pyogenic arthritis, unspecified: Secondary | ICD-10-CM | POA: Diagnosis present

## 2018-07-19 HISTORY — PX: I & D KNEE WITH POLY EXCHANGE: SHX5024

## 2018-07-19 LAB — NOVEL CORONAVIRUS, NAA (HOSP ORDER, SEND-OUT TO REF LAB; TAT 18-24 HRS): SARS-CoV-2, NAA: NOT DETECTED

## 2018-07-19 LAB — TYPE AND SCREEN
ABO/RH(D): A POS
Antibody Screen: NEGATIVE

## 2018-07-19 SURGERY — IRRIGATION AND DEBRIDEMENT KNEE WITH POLY EXCHANGE
Anesthesia: Spinal | Laterality: Right

## 2018-07-19 MED ORDER — ONDANSETRON HCL 4 MG/2ML IJ SOLN: 4.0000 mg | Freq: Four times a day (QID) | INTRAMUSCULAR | Status: DC | PRN

## 2018-07-19 MED ORDER — PHENOL 1.4 % MT LIQD: 1.0000 | OROMUCOSAL | Status: DC | PRN

## 2018-07-19 MED ORDER — ACETAMINOPHEN 10 MG/ML IV SOLN: 1000.0000 mg | Freq: Once | INTRAVENOUS | Status: DC | PRN

## 2018-07-19 MED ORDER — SODIUM CHLORIDE 0.9 % IV SOLN
INTRAVENOUS | Status: DC
  Administered 2018-07-20: 05:00:00 via INTRAVENOUS

## 2018-07-19 MED ORDER — OXYCODONE HCL 5 MG PO TABS: 5.0000 mg | ORAL_TABLET | Freq: Once | ORAL | Status: DC | PRN

## 2018-07-19 MED ORDER — PROPOFOL 10 MG/ML IV BOLUS
INTRAVENOUS | Status: AC
  Filled 2018-07-19: qty 60

## 2018-07-19 MED ORDER — PROPOFOL 10 MG/ML IV BOLUS
INTRAVENOUS | Status: DC | PRN
  Administered 2018-07-19: 10 mg via INTRAVENOUS
  Administered 2018-07-19: 20 mg via INTRAVENOUS

## 2018-07-19 MED ORDER — ZOLPIDEM TARTRATE 5 MG PO TABS
5.0000 mg | ORAL_TABLET | Freq: Every evening | ORAL | Status: DC
  Administered 2018-07-19 – 2018-07-20 (×2): 5 mg via ORAL
  Filled 2018-07-19 (×2): qty 1

## 2018-07-19 MED ORDER — DILTIAZEM HCL ER COATED BEADS 120 MG PO CP24
120.0000 mg | ORAL_CAPSULE | Freq: Every day | ORAL | Status: DC
  Administered 2018-07-20 – 2018-07-21 (×2): 120 mg via ORAL
  Filled 2018-07-19 (×2): qty 1

## 2018-07-19 MED ORDER — HYDROMORPHONE HCL 1 MG/ML IJ SOLN: 0.5000 mg | INTRAMUSCULAR | Status: DC | PRN

## 2018-07-19 MED ORDER — SCOPOLAMINE 1 MG/3DAYS TD PT72
MEDICATED_PATCH | TRANSDERMAL | Status: AC
  Filled 2018-07-19: qty 1

## 2018-07-19 MED ORDER — MIDAZOLAM HCL 2 MG/2ML IJ SOLN
1.0000 mg | INTRAMUSCULAR | Status: DC
  Administered 2018-07-19: 1 mg via INTRAVENOUS
  Filled 2018-07-19: qty 2

## 2018-07-19 MED ORDER — OXYCODONE HCL 5 MG PO TABS
5.0000 mg | ORAL_TABLET | ORAL | Status: DC | PRN
  Filled 2018-07-19: qty 1

## 2018-07-19 MED ORDER — DEXAMETHASONE SODIUM PHOSPHATE 10 MG/ML IJ SOLN: 8.0000 mg | Freq: Once | INTRAMUSCULAR | Status: DC

## 2018-07-19 MED ORDER — FLECAINIDE ACETATE 50 MG PO TABS
75.0000 mg | ORAL_TABLET | Freq: Two times a day (BID) | ORAL | Status: DC
  Administered 2018-07-19 – 2018-07-21 (×4): 75 mg via ORAL
  Filled 2018-07-19 (×4): qty 2

## 2018-07-19 MED ORDER — CHLORHEXIDINE GLUCONATE 4 % EX LIQD: 60.0000 mL | Freq: Once | CUTANEOUS | Status: DC

## 2018-07-19 MED ORDER — SCOPOLAMINE 1 MG/3DAYS TD PT72
1.0000 | MEDICATED_PATCH | TRANSDERMAL | Status: DC
  Administered 2018-07-19: 1.5 mg via TRANSDERMAL

## 2018-07-19 MED ORDER — PROPOFOL 500 MG/50ML IV EMUL
INTRAVENOUS | Status: DC | PRN
  Administered 2018-07-19: 100 ug/kg/min via INTRAVENOUS

## 2018-07-19 MED ORDER — ACETAMINOPHEN 500 MG PO TABS
1000.0000 mg | ORAL_TABLET | Freq: Four times a day (QID) | ORAL | Status: AC
  Administered 2018-07-19 – 2018-07-20 (×2): 1000 mg via ORAL
  Filled 2018-07-19 (×3): qty 2

## 2018-07-19 MED ORDER — RIVAROXABAN 10 MG PO TABS
10.0000 mg | ORAL_TABLET | Freq: Every day | ORAL | Status: DC
  Administered 2018-07-20: 10 mg via ORAL
  Filled 2018-07-19: qty 1

## 2018-07-19 MED ORDER — METHOCARBAMOL 500 MG PO TABS
500.0000 mg | ORAL_TABLET | Freq: Four times a day (QID) | ORAL | Status: DC | PRN
  Filled 2018-07-19: qty 1

## 2018-07-19 MED ORDER — SODIUM CHLORIDE 0.9 % IR SOLN
Status: DC | PRN
  Administered 2018-07-19: 3000 mL
  Administered 2018-07-19: 1000 mL

## 2018-07-19 MED ORDER — HYDROCHLOROTHIAZIDE 12.5 MG PO CAPS
12.5000 mg | ORAL_CAPSULE | Freq: Two times a day (BID) | ORAL | Status: DC
  Administered 2018-07-20 – 2018-07-21 (×3): 12.5 mg via ORAL
  Filled 2018-07-19 (×3): qty 1

## 2018-07-19 MED ORDER — LIDOCAINE HCL (CARDIAC) PF 100 MG/5ML IV SOSY
PREFILLED_SYRINGE | INTRAVENOUS | Status: DC | PRN
  Administered 2018-07-19: 20 mg via INTRAVENOUS
  Administered 2018-07-19: 40 mg via INTRAVENOUS

## 2018-07-19 MED ORDER — METHOCARBAMOL 500 MG IVPB - SIMPLE MED
500.0000 mg | Freq: Four times a day (QID) | INTRAVENOUS | Status: DC | PRN
  Filled 2018-07-19: qty 50

## 2018-07-19 MED ORDER — PROMETHAZINE HCL 25 MG/ML IJ SOLN: 6.2500 mg | INTRAMUSCULAR | Status: DC | PRN

## 2018-07-19 MED ORDER — METOCLOPRAMIDE HCL 5 MG/ML IJ SOLN: 5.0000 mg | Freq: Three times a day (TID) | INTRAMUSCULAR | Status: DC | PRN

## 2018-07-19 MED ORDER — ONDANSETRON HCL 4 MG/2ML IJ SOLN
INTRAMUSCULAR | Status: DC | PRN
  Administered 2018-07-19: 4 mg via INTRAVENOUS

## 2018-07-19 MED ORDER — ACETAMINOPHEN 10 MG/ML IV SOLN
1000.0000 mg | Freq: Four times a day (QID) | INTRAVENOUS | Status: DC
  Administered 2018-07-19: 1000 mg via INTRAVENOUS
  Filled 2018-07-19: qty 100

## 2018-07-19 MED ORDER — OXYCODONE HCL 5 MG/5ML PO SOLN: 5.0000 mg | Freq: Once | ORAL | Status: DC | PRN

## 2018-07-19 MED ORDER — DOCUSATE SODIUM 100 MG PO CAPS
100.0000 mg | ORAL_CAPSULE | Freq: Two times a day (BID) | ORAL | Status: DC
  Administered 2018-07-19 – 2018-07-21 (×4): 100 mg via ORAL
  Filled 2018-07-19 (×4): qty 1

## 2018-07-19 MED ORDER — MENTHOL 3 MG MT LOZG: 1.0000 | LOZENGE | OROMUCOSAL | Status: DC | PRN

## 2018-07-19 MED ORDER — SODIUM CHLORIDE 0.9 % IV SOLN
INTRAVENOUS | Status: DC | PRN
  Administered 2018-07-19: 50 ug/min via INTRAVENOUS

## 2018-07-19 MED ORDER — DIPHENHYDRAMINE HCL 12.5 MG/5ML PO ELIX: 12.5000 mg | ORAL_SOLUTION | ORAL | Status: DC | PRN

## 2018-07-19 MED ORDER — BUPIVACAINE-EPINEPHRINE (PF) 0.5% -1:200000 IJ SOLN
INTRAMUSCULAR | Status: DC | PRN
  Administered 2018-07-19: 15 mL via PERINEURAL

## 2018-07-19 MED ORDER — FLEET ENEMA 7-19 GM/118ML RE ENEM: 1.0000 | ENEMA | Freq: Once | RECTAL | Status: DC | PRN

## 2018-07-19 MED ORDER — LACTATED RINGERS IV SOLN
INTRAVENOUS | Status: DC
  Administered 2018-07-19 (×2): via INTRAVENOUS

## 2018-07-19 MED ORDER — FENTANYL CITRATE (PF) 100 MCG/2ML IJ SOLN
50.0000 ug | INTRAMUSCULAR | Status: DC
  Administered 2018-07-19: 50 ug via INTRAVENOUS
  Filled 2018-07-19: qty 2

## 2018-07-19 MED ORDER — TRAMADOL HCL 50 MG PO TABS: 50.0000 mg | ORAL_TABLET | Freq: Four times a day (QID) | ORAL | Status: DC | PRN

## 2018-07-19 MED ORDER — ONDANSETRON HCL 4 MG PO TABS: 4.0000 mg | ORAL_TABLET | Freq: Four times a day (QID) | ORAL | Status: DC | PRN

## 2018-07-19 MED ORDER — CEFAZOLIN SODIUM-DEXTROSE 2-4 GM/100ML-% IV SOLN
2.0000 g | INTRAVENOUS | Status: AC
  Administered 2018-07-19: 2 g via INTRAVENOUS
  Filled 2018-07-19: qty 100

## 2018-07-19 MED ORDER — TRANEXAMIC ACID-NACL 1000-0.7 MG/100ML-% IV SOLN
1000.0000 mg | INTRAVENOUS | Status: AC
  Administered 2018-07-19: 1000 mg via INTRAVENOUS
  Filled 2018-07-19: qty 100

## 2018-07-19 MED ORDER — DEXAMETHASONE SODIUM PHOSPHATE 10 MG/ML IJ SOLN
INTRAMUSCULAR | Status: DC | PRN
  Administered 2018-07-19: 10 mg via INTRAVENOUS

## 2018-07-19 MED ORDER — POVIDONE-IODINE 10 % EX SWAB: 2.0000 "application " | Freq: Once | CUTANEOUS | Status: DC

## 2018-07-19 MED ORDER — LACTATED RINGERS IV SOLN
INTRAVENOUS | Status: DC
  Administered 2018-07-19: 12:00:00 via INTRAVENOUS

## 2018-07-19 MED ORDER — PHENYLEPHRINE 40 MCG/ML (10ML) SYRINGE FOR IV PUSH (FOR BLOOD PRESSURE SUPPORT)
PREFILLED_SYRINGE | INTRAVENOUS | Status: DC | PRN
  Administered 2018-07-19 (×5): 80 ug via INTRAVENOUS

## 2018-07-19 MED ORDER — FENTANYL CITRATE (PF) 100 MCG/2ML IJ SOLN: 25.0000 ug | INTRAMUSCULAR | Status: DC | PRN

## 2018-07-19 MED ORDER — BUPIVACAINE IN DEXTROSE 0.75-8.25 % IT SOLN
INTRATHECAL | Status: DC | PRN
  Administered 2018-07-19: 2 mL via INTRATHECAL

## 2018-07-19 MED ORDER — CEFAZOLIN SODIUM-DEXTROSE 2-4 GM/100ML-% IV SOLN
2.0000 g | Freq: Four times a day (QID) | INTRAVENOUS | Status: AC
  Administered 2018-07-19 – 2018-07-20 (×2): 2 g via INTRAVENOUS
  Filled 2018-07-19 (×2): qty 100

## 2018-07-19 MED ORDER — METOCLOPRAMIDE HCL 5 MG PO TABS: 5.0000 mg | ORAL_TABLET | Freq: Three times a day (TID) | ORAL | Status: DC | PRN

## 2018-07-19 MED ORDER — POLYETHYLENE GLYCOL 3350 17 G PO PACK: 17.0000 g | PACK | Freq: Every day | ORAL | Status: DC | PRN

## 2018-07-19 MED ORDER — DEXAMETHASONE SODIUM PHOSPHATE 10 MG/ML IJ SOLN
10.0000 mg | Freq: Once | INTRAMUSCULAR | Status: AC
  Administered 2018-07-20: 10 mg via INTRAVENOUS
  Filled 2018-07-19: qty 1

## 2018-07-19 MED ORDER — BISACODYL 10 MG RE SUPP: 10.0000 mg | Freq: Every day | RECTAL | Status: DC | PRN

## 2018-07-19 MED ORDER — METOPROLOL TARTRATE 25 MG PO TABS
25.0000 mg | ORAL_TABLET | Freq: Two times a day (BID) | ORAL | Status: DC
  Administered 2018-07-19 – 2018-07-21 (×4): 25 mg via ORAL
  Filled 2018-07-19 (×4): qty 1

## 2018-07-19 SURGICAL SUPPLY — 50 items
BAG SPEC THK2 15X12 ZIP CLS (MISCELLANEOUS) ×1
BAG ZIPLOCK 12X15 (MISCELLANEOUS) ×3 IMPLANT
BANDAGE ACE 6X5 VEL STRL LF (GAUZE/BANDAGES/DRESSINGS) ×3 IMPLANT
BLADE SAG 18X100X1.27 (BLADE) ×2 IMPLANT
CLOSURE WOUND 1/2 X4 (GAUZE/BANDAGES/DRESSINGS) ×2
CLOTH BEACON ORANGE TIMEOUT ST (SAFETY) ×3 IMPLANT
COVER SURGICAL LIGHT HANDLE (MISCELLANEOUS) ×3 IMPLANT
COVER WAND RF STERILE (DRAPES) IMPLANT
CUFF TOURN SGL QUICK 34 (TOURNIQUET CUFF) ×3
CUFF TRNQT CYL 34X4.125X (TOURNIQUET CUFF) ×1 IMPLANT
DRAPE U-SHAPE 47X51 STRL (DRAPES) ×3 IMPLANT
DRSG ADAPTIC 3X8 NADH LF (GAUZE/BANDAGES/DRESSINGS) ×3 IMPLANT
DRSG PAD ABDOMINAL 8X10 ST (GAUZE/BANDAGES/DRESSINGS) ×5 IMPLANT
DURAPREP 26ML APPLICATOR (WOUND CARE) ×3 IMPLANT
ELECT REM PT RETURN 15FT ADLT (MISCELLANEOUS) ×3 IMPLANT
EVACUATOR 1/8 PVC DRAIN (DRAIN) ×3 IMPLANT
GAUZE SPONGE 4X4 12PLY STRL (GAUZE/BANDAGES/DRESSINGS) ×3 IMPLANT
GLOVE BIO SURGEON STRL SZ7.5 (GLOVE) ×3 IMPLANT
GLOVE BIO SURGEON STRL SZ8 (GLOVE) ×3 IMPLANT
GLOVE BIOGEL PI IND STRL 7.0 (GLOVE) ×1 IMPLANT
GLOVE BIOGEL PI IND STRL 8 (GLOVE) ×1 IMPLANT
GLOVE BIOGEL PI INDICATOR 7.0 (GLOVE) ×2
GLOVE BIOGEL PI INDICATOR 8 (GLOVE) ×2
GLOVE SURG SS PI 7.0 STRL IVOR (GLOVE) ×3 IMPLANT
GOWN STRL REUS W/TWL LRG LVL3 (GOWN DISPOSABLE) ×9 IMPLANT
HANDPIECE INTERPULSE COAX TIP (DISPOSABLE) ×3
HOLDER FOLEY CATH W/STRAP (MISCELLANEOUS) ×2 IMPLANT
IMMOBILIZER KNEE 20 (SOFTGOODS) ×3
IMMOBILIZER KNEE 20 THIGH 36 (SOFTGOODS) ×1 IMPLANT
KIT TURNOVER KIT A (KITS) IMPLANT
MANIFOLD NEPTUNE II (INSTRUMENTS) ×3 IMPLANT
NS IRRIG 1000ML POUR BTL (IV SOLUTION) ×3 IMPLANT
PACK TOTAL KNEE CUSTOM (KITS) ×3 IMPLANT
PADDING CAST COTTON 6X4 STRL (CAST SUPPLIES) ×6 IMPLANT
PLATE ROT INSERT 15MM SIZE 5 (Plate) ×2 IMPLANT
PROTECTOR NERVE ULNAR (MISCELLANEOUS) ×3 IMPLANT
SET HNDPC FAN SPRY TIP SCT (DISPOSABLE) ×1 IMPLANT
SPONGE LAP 18X18 RF (DISPOSABLE) ×3 IMPLANT
STAPLER VISISTAT 35W (STAPLE) ×5 IMPLANT
STRIP CLOSURE SKIN 1/2X4 (GAUZE/BANDAGES/DRESSINGS) ×4 IMPLANT
SUT MNCRL AB 4-0 PS2 18 (SUTURE) ×3 IMPLANT
SUT PDS AB 1 CT1 27 (SUTURE) ×9 IMPLANT
SUT VIC AB 2-0 CT1 27 (SUTURE) ×9
SUT VIC AB 2-0 CT1 TAPERPNT 27 (SUTURE) ×3 IMPLANT
SUT VLOC 180 0 24IN GS25 (SUTURE) IMPLANT
SWAB COLLECTION DEVICE MRSA (MISCELLANEOUS) ×3 IMPLANT
SWAB CULTURE ESWAB REG 1ML (MISCELLANEOUS) ×3 IMPLANT
TRAY FOLEY MTR SLVR 16FR STAT (SET/KITS/TRAYS/PACK) ×3 IMPLANT
WATER STERILE IRR 1000ML POUR (IV SOLUTION) ×3 IMPLANT
WRAP KNEE MAXI GEL POST OP (GAUZE/BANDAGES/DRESSINGS) ×4 IMPLANT

## 2018-07-19 NOTE — Anesthesia Procedure Notes (Signed)
Anesthesia Regional Block: Adductor canal block   Pre-Anesthetic Checklist: ,, timeout performed, Correct Patient, Correct Site, Correct Laterality, Correct Procedure, Correct Position, site marked, Risks and benefits discussed, pre-op evaluation,  At surgeon's request and post-op pain management  Laterality: Right  Prep: Maximum Sterile Barrier Precautions used, chloraprep       Needles:  Injection technique: Single-shot  Needle Type: Echogenic Stimulator Needle     Needle Length: 9cm  Needle Gauge: 22     Additional Needles:   Procedures:,,,, ultrasound used (permanent image in chart),,,,  Narrative:  Start time: 07/19/2018 1:01 PM End time: 07/19/2018 1:04 PM Injection made incrementally with aspirations every 5 mL.  Performed by: Personally  Anesthesiologist: Kaylyn Layer, MD  Additional Notes: Risks, benefits, and alternative discussed. Patient gave consent for procedure. Patient prepped and draped in sterile fashion. Sedation administered, patient remains easily responsive to voice. Relevant anatomy identified with ultrasound guidance. Local anesthetic given in 5cc increments with no signs or symptoms of intravascular injection. No pain or paraesthesias with injection. Patient monitored throughout procedure with signs of LAST or immediate complications. Tolerated well. Ultrasound image placed in chart.  Amalia Greenhouse, MD

## 2018-07-19 NOTE — Anesthesia Postprocedure Evaluation (Signed)
Anesthesia Post Note  Patient: Brent Dougherty  Procedure(s) Performed: IRRIGATION AND DEBRIDEMENT KNEE WITH POLY EXCHANGE (Right )     Patient location during evaluation: PACU Anesthesia Type: Spinal Level of consciousness: awake and alert Pain management: pain level controlled Vital Signs Assessment: post-procedure vital signs reviewed and stable Respiratory status: spontaneous breathing, nonlabored ventilation and respiratory function stable Cardiovascular status: blood pressure returned to baseline and stable Postop Assessment: no apparent nausea or vomiting Anesthetic complications: no        Kaylyn Layer

## 2018-07-19 NOTE — Progress Notes (Signed)
PT Cancellation Note  Patient Details Name: SASAN BUNYARD MRN: [id number] DOB: 1950-05-20   Cancelled Treatment:    Reason Eval/Treat Not Completed: Medical issues which prohibited therapy(anesthesia hasn't worn off yet, RLE sensation to light touch is diminished. Will reattempt tomorrow morning. )   Tamala Ser PT 07/19/2018  Acute Rehabilitation Services Pager [phone] Office [phone]

## 2018-07-19 NOTE — Op Note (Signed)
NAME: Brent Dougherty, Brent Dougherty. MEDICAL RECORD [id number] ACCOUNT [id number] DATE OF BIRTH:06-29-1950 FACILITY: [facility] LOCATION: [facility] PHYSICIAN:Earnie Rockhold Dulcy Fanny, MD  OPERATIVE REPORT  DATE OF PROCEDURE:  07/19/2018  PREOPERATIVE DIAGNOSIS:  Infected right total knee arthroplasty.  POSTOPERATIVE DIAGNOSIS:  Infected right total knee arthroplasty.  PROCEDURE:  Right knee arthrotomy, irrigation and debridement and polyethylene exchange.  SURGEON:  [name], MD  ASSISTANT:  Arther Abbott, PA-C  ANESTHESIA:  Adductor canal block and spinal.  ESTIMATED BLOOD LOSS:  100 mL.  DRAINS:  Hemovac x1.  TOURNIQUET TIME:  26 minutes at 300 mmHg.  COMPLICATIONS:  None.  CONDITION:  Stable to recovery.  BRIEF CLINICAL NOTE:  The patient is a 68 year old male who had a right knee revision approximately a year ago.  He had issues with the wound healing superiorly at first.  He did not have any intraarticular swelling or issues and then recently presented  with a knee effusion.  We aspirated it, and it was positive for bacteria.  He presents now for irrigation, debridement and polyethylene exchange and is here today for that procedure.  PROCEDURE IN DETAIL:  After successful administration of adductor canal block and spinal anesthetic, a tourniquet was placed high on the right thigh and right lower extremity prepped and draped in the usual sterile fashion.  Extremity was wrapped in  Esmarch, tourniquet inflated to 300 mmHg.  A midline incision was made with a 10 blade through subcutaneous tissue to the level of the extensor mechanism.  A fresh blade was used to make a medial parapatellar arthrotomy.  There was a fair amount of  synovial fluid present, but it was not purulent in nature.  It was sent for Gram stain, culture and sensitivity.  A thorough scar excision was performed, not a significant amount of inflamed synovium.  The tissue had more of a scar-like appearance than  an infectious-type  appearance.  The synovectomy was performed and scar tissue removed.  I then flexed the knee at 90 degrees.  I was able to sublux the patella laterally.  I was able to remove the tibial polyethylene, which was a 15 mm rotating platform  posterior stabilized polyethylene for a size 5 tibial tray.  This was removed, and we were able to then also debride any abnormal tissue from the back of the joint.  The tissue actually looked fairly normal back there.  The joint was then irrigated with  about 5 L of saline using pulsatile lavage.  I then used a liter of Aricept, initially washing with about 300 mL, leaving it for approximately 3 minutes, washing and then suctioning it out and then irrigating 300-500 and leaving it in.  I then placed the  new 15 mm size 5 rotating platform posterior stabilized polyethylene into the tibial tray.  The knee was reduced with excellent stability.  The knee was then held in extension.  Aricept that was remaining was left intact.  A Hemovac drain was placed.   Then the Aricept was removed and arthrotomy closed over the drain with running 0 Stratafix suture.  Tourniquet was released,  total time of 26 minutes.  A second limb of the Hemovac drain was placed in the subcu tissues.  Subcu was then closed with interrupted 2-0 Vicryl and skin with staples.  Incisions were cleaned and dried and a bulky sterile dressing applied.  He was then  placed into a knee immobilizer, awakened and transported to recovery in stable condition.  LN/NUANCE  D:07/19/2018 T:07/19/2018 JOB:[id number]

## 2018-07-19 NOTE — Anesthesia Procedure Notes (Signed)
Spinal  Patient location during procedure: OR End time: 07/19/2018 2:09 PM Staffing Resident/CRNA: Caryl Pina T, CRNA Performed: resident/CRNA  Preanesthetic Checklist Completed: patient identified, site marked, surgical consent, pre-op evaluation, timeout performed, IV checked, risks and benefits discussed and monitors and equipment checked Spinal Block Patient position: sitting Prep: DuraPrep Patient monitoring: heart rate, cardiac monitor, continuous pulse ox and blood pressure Approach: midline Location: L4-5 Injection technique: single-shot Needle Needle type: Pencan  Needle gauge: 24 G Needle length: 9 cm Assessment Sensory level: T6 Additional Notes Expiration date of kit checked and confirmed. Patient tolerated procedure well, without complications.

## 2018-07-19 NOTE — Transfer of Care (Signed)
Immediate Anesthesia Transfer of Care Note  Patient: Brent Dougherty  Procedure(s) Performed: IRRIGATION AND DEBRIDEMENT KNEE WITH POLY EXCHANGE (Right )  Patient Location: PACU  Anesthesia Type:Spinal  Level of Consciousness: awake, alert , oriented and patient cooperative  Airway & Oxygen Therapy: Patient Spontanous Breathing and Patient connected to face mask oxygen  Post-op Assessment: Report given to RN, Post -op Vital signs reviewed and stable and Patient moving all extremities  Post vital signs: Reviewed and stable  Last Vitals:  Vitals Value Taken Time  BP    Temp    Pulse 46 07/19/2018  3:40 PM  Resp 12 07/19/2018  3:40 PM  SpO2 100 % 07/19/2018  3:40 PM  Vitals shown include unvalidated device data.  Last Pain:  Vitals:   07/19/18 1200  TempSrc:   PainSc: 4          Complications: No apparent anesthesia complications

## 2018-07-19 NOTE — Discharge Instructions (Signed)
° °Dr. Frank Aluisio °Total Joint Specialist °[facility] °[address] °[place], [place] [address] °[phone] ° °TOTAL KNEE POLYETHYLENE REVISION POSTOPERATIVE DIRECTIONS ° °Knee Rehabilitation, Guidelines Following Surgery  °Results after knee surgery are often greatly improved when you follow the exercise, range of motion and muscle strengthening exercises prescribed by your doctor. Safety measures are also important to protect the knee from further injury. Any time any of these exercises cause you to have increased pain or swelling in your knee joint, decrease the amount until you are comfortable again and slowly increase them. If you have problems or questions, call your caregiver or physical therapist for advice.  ° °HOME CARE INSTRUCTIONS  °• Remove items at home which could result in a fall. This includes throw rugs or furniture in walking pathways.  °· ICE to the affected knee every three hours for 30 minutes at a time and then as needed for pain and swelling.  Continue to use ice on the knee for pain and swelling from surgery. You may notice swelling that will progress down to the foot and ankle.  This is normal after surgery.  Elevate the leg when you are not up walking on it.   °· Continue to use the breathing machine which will help keep your temperature down.  It is common for your temperature to cycle up and down following surgery, especially at night when you are not up moving around and exerting yourself.  The breathing machine keeps your lungs expanded and your temperature down. °· Do not place pillow under knee, focus on keeping the knee straight while resting ° °DIET °You may resume your previous home diet once your are discharged from the hospital. ° °DRESSING / WOUND CARE / SHOWERING °You may change your dressing 3-5 days after surgery.  Then change the dressing every day with sterile gauze.  Please use good hand washing techniques before changing the dressing.  Do not use  any lotions or creams on the incision until instructed by your surgeon. °You may start showering once you are discharged home but do not submerge the incision under water. Just pat the incision dry and apply a dry gauze dressing on daily. °Change the surgical dressing daily and reapply a dry dressing each time. ° °ACTIVITY °Walk with your walker as instructed. °Use walker as long as suggested by your caregivers. °Avoid periods of inactivity such as sitting longer than an hour when not asleep. This helps prevent blood clots.  °You may resume a sexual relationship in one month or when given the OK by your doctor.  °You may return to work once you are cleared by your doctor.  °Do not drive a car for 6 weeks or until released by you surgeon.  °Do not drive while taking narcotics. ° °WEIGHT BEARING °Weight bearing as tolerated with assist device (walker, cane, etc) as directed, use it as long as suggested by your surgeon or therapist, typically at least 4-6 weeks. ° °POSTOPERATIVE CONSTIPATION PROTOCOL °Constipation - defined medically as fewer than three stools per week and severe constipation as less than one stool per week. ° °One of the most common issues patients have following surgery is constipation.  Even if you have a regular bowel pattern at home, your normal regimen is likely to be disrupted due to multiple reasons following surgery.  Combination of anesthesia, postoperative narcotics, change in appetite and fluid intake all can affect your bowels.  In order to avoid complications following surgery, here are   some recommendations in order to help you during your recovery period. ° °Colace (docusate) - Pick up an over-the-counter form of Colace or another stool softener and take twice a day as long as you are requiring postoperative pain medications.  Take with a full glass of water daily.  If you experience loose stools or diarrhea, hold the colace until you stool forms back up.  If your symptoms do not get  better within 1 week or if they get worse, check with your doctor. ° °Dulcolax (bisacodyl) - Pick up over-the-counter and take as directed by the product packaging as needed to assist with the movement of your bowels.  Take with a full glass of water.  Use this product as needed if not relieved by Colace only.  ° °MiraLax (polyethylene glycol) - Pick up over-the-counter to have on hand.  MiraLax is a solution that will increase the amount of water in your bowels to assist with bowel movements.  Take as directed and can mix with a glass of water, juice, soda, coffee, or tea.  Take if you go more than two days without a movement. °Do not use MiraLax more than once per day. Call your doctor if you are still constipated or irregular after using this medication for 7 days in a row. ° °If you continue to have problems with postoperative constipation, please contact the office for further assistance and recommendations.  If you experience "the worst abdominal pain ever" or develop nausea or vomiting, please contact the office immediatly for further recommendations for treatment. ° °ITCHING ° If you experience itching with your medications, try taking only a single pain pill, or even half a pain pill at a time.  You can also use Benadryl over the counter for itching or also to help with sleep.  ° °TED HOSE STOCKINGS °Wear the elastic stockings on both legs for three weeks following surgery during the day but you may remove then at night for sleeping. ° °MEDICATIONS °See your medication summary on the “After Visit Summary” that the nursing staff will review with you prior to discharge.  You may have some home medications which will be placed on hold until you complete the course of blood thinner medication.  It is important for you to complete the blood thinner medication as prescribed by your surgeon.  Continue your approved medications as instructed at time of discharge. ° °PRECAUTIONS °If you experience chest pain or  shortness of breath - call 911 immediately for transfer to the hospital emergency department.  °If you develop a fever greater that 101 F, purulent drainage from wound, increased redness or drainage from wound, foul odor from the wound/dressing, or calf pain - CONTACT YOUR SURGEON.   °                                                °FOLLOW-UP APPOINTMENTS °Make sure you keep all of your appointments after your operation with your surgeon and caregivers. You should call the office at the above phone number and make an appointment for approximately two weeks after the date of your surgery or on the date instructed by your surgeon outlined in the "After Visit Summary". ° ° °RANGE OF MOTION AND STRENGTHENING EXERCISES  °Rehabilitation of the knee is important following a knee injury or an operation. After just a few days of immobilization,   the muscles of the thigh which control the knee become weakened and shrink (atrophy). Knee exercises are designed to build up the tone and strength of the thigh muscles and to improve knee motion. Often times heat used for twenty to thirty minutes before working out will loosen up your tissues and help with improving the range of motion but do not use heat for the first two weeks following surgery. These exercises can be done on a training (exercise) mat, on the floor, on a table or on a bed. Use what ever works the best and is most comfortable for you Knee exercises include:  °• Leg Lifts - While your knee is still immobilized in a splint or cast, you can do straight leg raises. Lift the leg to 60 degrees, hold for 3 sec, and slowly lower the leg. Repeat 10-20 times 2-3 times daily. Perform this exercise against resistance later as your knee gets better.  °• Quad and Hamstring Sets - Tighten up the muscle on the front of the thigh (Quad) and hold for 5-10 sec. Repeat this 10-20 times hourly. Hamstring sets are done by pushing the foot backward against an object and holding for 5-10  sec. Repeat as with quad sets.  °· Leg Slides: Lying on your back, slowly slide your foot toward your buttocks, bending your knee up off the floor (only go as far as is comfortable). Then slowly slide your foot back down until your leg is flat on the floor again. °· Angel Wings: Lying on your back spread your legs to the side as far apart as you can without causing discomfort.  °A rehabilitation program following serious knee injuries can speed recovery and prevent re-injury in the future due to weakened muscles. Contact your doctor or a physical therapist for more information on knee rehabilitation.  ° °IF YOU ARE TRANSFERRED TO A SKILLED REHAB FACILITY °If the patient is transferred to a skilled rehab facility following release from the hospital, a list of the current medications will be sent to the facility for the patient to continue.  When discharged from the skilled rehab facility, please have the facility set up the patient's Home Health Physical Therapy prior to being released. Also, the skilled facility will be responsible for providing the patient with their medications at time of release from the facility to include their pain medication, the muscle relaxants, and their blood thinner medication. If the patient is still at the rehab facility at time of the two week follow up appointment, the skilled rehab facility will also need to assist the patient in arranging follow up appointment in our office and any transportation needs. ° °MAKE SURE YOU:  °• Understand these instructions.  °• Get help right away if you are not doing well or get worse.  ° ° °Pick up stool softner and laxative for home use following surgery while on pain medications. °Do not submerge incision under water. °Please use good hand washing techniques while changing dressing each day. °May shower starting three days after surgery. °Please use a clean towel to pat the incision dry following showers. °Continue to use ice for pain and  swelling after surgery. °Do not use any lotions or creams on the incision until instructed by your surgeon. ° °

## 2018-07-19 NOTE — Brief Op Note (Signed)
07/19/2018  3:28 PM  PATIENT:  Brent Dougherty  68 y.o. male  PRE-OPERATIVE DIAGNOSIS:  infected right total knee arthroplasty  POST-OPERATIVE DIAGNOSIS:  infected right total knee arthroplasty  PROCEDURE:  Procedure(s): IRRIGATION AND DEBRIDEMENT KNEE WITH POLY EXCHANGE (Right)  SURGEON:  Surgeon(s) and Role:    [name], MD - Primary  PHYSICIAN ASSISTANT:   ASSISTANTS: Arther Abbott, PA-C   ANESTHESIA:   adductor canal block and spinal  EBL:  100 mL   BLOOD ADMINISTERED:none  DRAINS: (medium) Hemovact drain(s) in the right knee with  Suction Open   LOCAL MEDICATIONS USED:  NONE  COUNTS:  YES  TOURNIQUET:  26 minutes @ 300 mm Hg  DICTATION: .Other Dictation: Dictation Number [id number]  PLAN OF CARE: Admit to inpatient   PATIENT DISPOSITION:  PACU - hemodynamically stable.

## 2018-07-19 NOTE — Progress Notes (Signed)
Assisted Dr. Harvie Junior with Right Adductor Canal Block. Side rails up, monitors on throughout procedure. See vital signs in flow sheet. Tolerated Procedure well.

## 2018-07-19 NOTE — Interval H&P Note (Signed)
History and Physical Interval Note:  07/19/2018 1:59 PM  Brent Dougherty  has presented today for surgery, with the diagnosis of infected right total knee arthroplasty.  The various methods of treatment have been discussed with the patient and family. After consideration of risks, benefits and other options for treatment, the patient has consented to  Procedure(s): IRRIGATION AND DEBRIDEMENT KNEE WITH POLY EXCHANGE (Right) as a surgical intervention.  The patient's history has been reviewed, patient examined, no change in status, stable for surgery.  I have reviewed the patient's chart and labs.  Questions were answered to the patient's satisfaction.     Homero Fellers [name]

## 2018-07-20 ENCOUNTER — Encounter (HOSPITAL_COMMUNITY): Payer: Self-pay | Admitting: Orthopedic Surgery

## 2018-07-20 LAB — BASIC METABOLIC PANEL
Anion gap: 9 (ref 5–15)
BUN: 13 mg/dL (ref 8–23)
CO2: 22 mmol/L (ref 22–32)
Calcium: 8.5 mg/dL — ABNORMAL LOW (ref 8.9–10.3)
Chloride: 103 mmol/L (ref 98–111)
Creatinine, Ser: 0.83 mg/dL (ref 0.61–1.24)
GFR calc Af Amer: >60 mL/min (ref >60)
GFR calc non Af Amer: >60 mL/min (ref >60)
Glucose, Bld: 150 mg/dL — ABNORMAL HIGH (ref 70–99)
Potassium: 4.3 mmol/L (ref 3.5–5.1)
Sodium: 134 mmol/L — ABNORMAL LOW (ref 135–145)

## 2018-07-20 LAB — CBC
HCT: 39.5 % (ref 39.0–52.0)
Hemoglobin: 13.2 g/dL (ref 13.0–17.0)
MCH: 30 pg (ref 26.0–34.0)
MCHC: 33.4 g/dL (ref 30.0–36.0)
MCV: 89.8 fL (ref 80.0–100.0)
Platelets: 257 10*3/uL (ref 150–400)
RBC: 4.4 MIL/uL (ref 4.22–5.81)
RDW: 13 % (ref 11.5–15.5)
WBC: 9.1 10*3/uL (ref 4.0–10.5)
nRBC: 0 % (ref 0.0–0.2)

## 2018-07-20 MED ORDER — CIPROFLOXACIN HCL 500 MG PO TABS
500.0000 mg | ORAL_TABLET | Freq: Two times a day (BID) | ORAL | Status: DC
  Administered 2018-07-20 – 2018-07-21 (×3): 500 mg via ORAL
  Filled 2018-07-20 (×3): qty 1

## 2018-07-20 MED ORDER — METHOCARBAMOL 500 MG PO TABS: 500.0000 mg | ORAL_TABLET | Freq: Four times a day (QID) | ORAL | 0 refills | Status: DC | PRN

## 2018-07-20 MED ORDER — OXYCODONE HCL 5 MG PO TABS: 5.0000 mg | ORAL_TABLET | Freq: Four times a day (QID) | ORAL | 0 refills | Status: DC | PRN

## 2018-07-20 MED ORDER — TRAMADOL HCL 50 MG PO TABS: 50.0000 mg | ORAL_TABLET | Freq: Four times a day (QID) | ORAL | 0 refills | Status: DC | PRN

## 2018-07-20 NOTE — Progress Notes (Signed)
PT Cancellation Note  Patient Details Name: Brent Dougherty MRN: [id number] DOB: 18-Mar-1950   Cancelled Treatment:     PT pm session deferred - pt with active bleeding - RN in room.  Will follow in am.   Othon Guardia 07/20/2018, 3:10 PM

## 2018-07-20 NOTE — Progress Notes (Signed)
Physical Therapy Treatment Patient Details Name: Brent Dougherty W Besaw MRN: [id number] DOB: 01/14/1951 Today's Date: 07/20/2018    History of Present Illness Pt s/p I&D and poly replacement of R knee.  Pt with hx of a-fib and dysrythmia.    PT Comments    Pt admitted as above and presenting with functional mobility limitations 2* decreased strength/ROM R LE and post op pain.  Pt should progress well to dc home with family assist.   Follow Up Recommendations  Follow surgeon's recommendation for DC plan and follow-up therapies     Equipment Recommendations  None recommended by PT    Recommendations for Other Services       Precautions / Restrictions Precautions Precautions: Knee;Fall Required Braces or Orthoses: Knee Immobilizer - Right Knee Immobilizer - Right: Discontinue once straight leg raise with < 10 degree lag Restrictions Weight Bearing Restrictions: No Other Position/Activity Restrictions: WBAT    Mobility  Bed Mobility Overal bed mobility: Needs Assistance Bed Mobility: Supine to Sit     Supine to sit: Min guard     General bed mobility comments: cues for sequence; min guard for R LE  Transfers Overall transfer level: Needs assistance Equipment used: Rolling walker (2 wheeled) Transfers: Sit to/from Stand Sit to Stand: Min guard         General transfer comment: steady assist with cues for use of UEs to self assist  Ambulation/Gait Ambulation/Gait assistance: Min assist;Min guard Gait Distance (Feet): 180 Feet Assistive device: Rolling walker (2 wheeled) Gait Pattern/deviations: Step-to pattern;Step-through pattern;Decreased step length - right;Decreased step length - left;Shuffle;Trunk flexed     General Gait Details: cues for posture, position from RW and initial sequence   Stairs             Wheelchair Mobility    Modified Rankin (Stroke Patients Only)       Balance Overall balance assessment: Mild deficits observed, not formally  tested                                          Cognition Arousal/Alertness: Awake/alert Behavior During Therapy: WFL for tasks assessed/performed Overall Cognitive Status: Within Functional Limits for tasks assessed                                        Exercises Total Joint Exercises Ankle Circles/Pumps: AROM;Both;20 reps;Supine    General Comments        Pertinent Vitals/Pain Pain Assessment: 0-10 Pain Score: 2  Pain Location: R knee Pain Descriptors / Indicators: Aching;Sore Pain Intervention(s): Limited activity within patient's tolerance;Monitored during session;Premedicated before session;Ice applied    Home Living Family/patient expects to be discharged to:: Private residence Living Arrangements: Spouse/significant other Available Help at Discharge: Family Type of Home: House Home Access: Level entry   Home Layout: One level Home Equipment: Environmental consultantWalker - 2 wheels;Cane - single point      Prior Function Level of Independence: Independent;Independent with assistive device(s)          PT Goals (current goals can now be found in the care plan section) Acute Rehab PT Goals Patient Stated Goal: Regain IND PT Goal Formulation: With patient Time For Goal Achievement: 08/03/18 Potential to Achieve Goals: Good    Frequency    7X/week      PT Plan  Co-evaluation              AM-PAC PT "6 Clicks" Mobility   Outcome Measure  Help needed turning from your back to your side while in a flat bed without using bedrails?: A Little Help needed moving from lying on your back to sitting on the side of a flat bed without using bedrails?: A Little Help needed moving to and from a bed to a chair (including a wheelchair)?: A Little Help needed standing up from a chair using your arms (e.g., wheelchair or bedside chair)?: A Little Help needed to walk in hospital room?: A Little Help needed climbing 3-5 steps with a railing? : A  Little 6 Click Score: 18    End of Session Equipment Utilized During Treatment: Gait belt;Right knee immobilizer Activity Tolerance: Patient tolerated treatment well Patient left: in chair;with call bell/phone within reach Nurse Communication: Mobility status PT Visit Diagnosis: Difficulty in walking, not elsewhere classified (R26.2)     Time: 4540-9811 PT Time Calculation (min) (ACUTE ONLY): 26 min  Charges:  $Gait Training: 8-22 mins                     Mauro Kaufmann PT Acute Rehabilitation Services Pager [phone] Office [phone]    Dominigue Gellner 07/20/2018, 2:35 PM

## 2018-07-20 NOTE — Progress Notes (Addendum)
   Subjective: 1 Day Post-Op Procedure(s) (LRB): IRRIGATION AND DEBRIDEMENT KNEE WITH POLY EXCHANGE (Right) Patient reports pain as mild.   Patient seen in rounds by Dr. Lequita Halt. Patient is well, and has had no acute complaints or problems. No issues overnight. Denies chest pain, SOB, or calf pain. Foley catheter removed this AM.  We will begin therapy today.   Objective: Vital signs in last 24 hours: Temp:  [97.5 F (36.4 C)-98.4 F (36.9 C)] 97.6 F (36.4 C) (06/04 0558) Pulse Rate:  [42-90] 74 (06/04 0558) Resp:  [13-28] 18 (06/04 0558) BP: (102-160)/(47-99) 160/94 (06/04 0558) SpO2:  [93 %-100 %] 98 % (06/04 0558) Weight:  [146.5 kg] 146.5 kg (06/03 1147)  Intake/Output from previous day:  Intake/Output Summary (Last 24 hours) at 07/20/2018 0730 Last data filed at 07/20/2018 0558 Gross per 24 hour  Intake 4099.34 ml  Output 3775 ml  Net 324.34 ml    Labs: Recent Labs    07/20/18 0452  HGB 13.2   Recent Labs    07/20/18 0452  WBC 9.1  RBC 4.40  HCT 39.5  PLT 257   Recent Labs    07/20/18 0452  NA 134*  K 4.3  CL 103  CO2 22  BUN 13  CREATININE 0.83  GLUCOSE 150*  CALCIUM 8.5*   Exam: General - Patient is Alert and Oriented Extremity - Neurologically intact Neurovascular intact Sensation intact distally Dorsiflexion/Plantar flexion intact Dressing - dressing C/D/I Motor Function - intact, moving foot and toes well on exam.   Past Medical History:  Diagnosis Date  . Arthritis    BOTH KNEES  . Asthma    uncomplicated  . Atrial fibrillation (HCC) 01/2017  . Bronchitis    LAST TIME WAS JAN 2014  . Chronic insomnia 11/14/2015  . Chronic left shoulder pain 01/19/2016  . Dysrhythmia   . Edema   . Elevated blood pressure reading   . History of blood transfusion   . History of kidney stones   . Morbid obesity (HCC) 11/14/2015  . Obstructive chronic bronchitis without exacerbation (HCC) 03/14/2008  . Osteoarthritis of knee 11/14/2015  . PONV  (postoperative nausea and vomiting)   . Sleep apnea    cpap wears at times not nightly     Assessment/Plan: 1 Day Post-Op Procedure(s) (LRB): IRRIGATION AND DEBRIDEMENT KNEE WITH POLY EXCHANGE (Right) Active Problems:   Septic arthritis of knee, right (HCC)  Estimated body mass index is 42.61 kg/m as calculated from the following:   Height as of this encounter: 6\' 1"  (1.854 m).   Weight as of this encounter: 146.5 kg. Advance diet Up with therapy  DVT Prophylaxis - Xarelto Weight bearing as tolerated. D/C O2 and pulse ox and try on room air. Hemovac pulled without difficulty, will begin therapy.  Intraop gram stain shows few WBC with no organisms seen. Culture pending. IV antibiotic regimen completed, taking ciprofloxacin 500 mg BID currently.  Plan is to go Home after hospital stay. Plan for discharge tomorrow pending progress with therapy. Scheduled for outpatient PT at [facility] in [place] beginning Monday.  Arther Abbott, PA-C Orthopedic Surgery 07/20/2018, 7:30 AM

## 2018-07-20 NOTE — Progress Notes (Signed)
Patient called out stating his leg was bleeding. Writer and other co-worker went in patient's room. Patient had bleed through four by four, ABD pad and Ace wrap. Writer had changed patient's dressing around 11:30am due to having some bloody drainage on his ace wrap. Pressure was applied for 15-31mins Dr. Tora Duck PA was notified. PA ordered to apply pressure and redo the dressing.  four packs of four by fours, four ABD pads, kerlix and then Ace wrap was changed. Applied ice. Checked on in patient has full sensation. Denies any numbness or tingling. Able to moves his toes. Writer checked back on patient. Patient denies any pain. Dressing was changed again at 1720. Bleeding had decreased. One site on top where the staples start was continue to bleed. Writer changed dressing's again and applied ice. Patient denies any numbness or tingling, has full sensation, able to move his toes. Capillary refill <3.

## 2018-07-21 LAB — BASIC METABOLIC PANEL
Anion gap: 6 (ref 5–15)
BUN: 16 mg/dL (ref 8–23)
CO2: 25 mmol/L (ref 22–32)
Calcium: 8.7 mg/dL — ABNORMAL LOW (ref 8.9–10.3)
Chloride: 106 mmol/L (ref 98–111)
Creatinine, Ser: 0.77 mg/dL (ref 0.61–1.24)
GFR calc Af Amer: >60 mL/min (ref >60)
GFR calc non Af Amer: >60 mL/min (ref >60)
Glucose, Bld: 135 mg/dL — ABNORMAL HIGH (ref 70–99)
Potassium: 4.1 mmol/L (ref 3.5–5.1)
Sodium: 137 mmol/L (ref 135–145)

## 2018-07-21 LAB — CBC
HCT: 37.6 % — ABNORMAL LOW (ref 39.0–52.0)
Hemoglobin: 12.1 g/dL — ABNORMAL LOW (ref 13.0–17.0)
MCH: 29 pg (ref 26.0–34.0)
MCHC: 32.2 g/dL (ref 30.0–36.0)
MCV: 90.2 fL (ref 80.0–100.0)
Platelets: 281 10*3/uL (ref 150–400)
RBC: 4.17 MIL/uL — ABNORMAL LOW (ref 4.22–5.81)
RDW: 13.2 % (ref 11.5–15.5)
WBC: 11.6 10*3/uL — ABNORMAL HIGH (ref 4.0–10.5)
nRBC: 0 % (ref 0.0–0.2)

## 2018-07-21 NOTE — Progress Notes (Signed)
Subjective: 2 Days Post-Op Procedure(s) (LRB): IRRIGATION AND DEBRIDEMENT KNEE WITH POLY EXCHANGE (Right) Patient reports pain as mild.   Patient seen in rounds with Dr. Lequita HaltAluisio. Patient is well, and has had no acute complaints or problems other than pain in the right knee. Per nursing, patient had bleeding from the incision and drain site yesterday and was re-dressed with 4x4s and ABD pads. Otherwise, no acute events overnight. Voiding without difficulty. Plan is to go Home after hospital stay.  Objective: Vital signs in last 24 hours: Temp:  [97.3 F (36.3 C)-97.9 F (36.6 C)] 97.3 F (36.3 C) (06/05 0606) Pulse Rate:  [67-90] 82 (06/05 0606) Resp:  [18-20] 20 (06/05 0606) BP: (109-133)/(69-92) 109/92 (06/05 0606) SpO2:  [96 %-98 %] 98 % (06/05 0606)  Intake/Output from previous day:  Intake/Output Summary (Last 24 hours) at 07/21/2018 0747 Last data filed at 07/21/2018 0606 Gross per 24 hour  Intake 1890.92 ml  Output 3425 ml  Net -1534.08 ml    Intake/Output this shift: No intake/output data recorded.  Labs: Recent Labs    07/20/18 0452 07/21/18 0435  HGB 13.2 12.1*   Recent Labs    07/20/18 0452 07/21/18 0435  WBC 9.1 11.6*  RBC 4.40 4.17*  HCT 39.5 37.6*  PLT 257 281   Recent Labs    07/20/18 0452 07/21/18 0435  NA 134* 137  K 4.3 4.1  CL 103 106  CO2 22 25  BUN 13 16  CREATININE 0.83 0.77  GLUCOSE 150* 135*  CALCIUM 8.5* 8.7*   No results for input(s): LABPT, INR in the last 72 hours.  Exam: General - Patient is Alert and Oriented Extremity - Neurologically intact Sensation intact distally Intact pulses distally Dorsiflexion/Plantar flexion intact Dressing/Incision - clean, some bleeding from the distal incision and drain site.  Motor Function - intact, moving foot and toes well on exam.   Past Medical History:  Diagnosis Date  . Arthritis    BOTH KNEES  . Asthma    uncomplicated  . Atrial fibrillation (HCC) 01/2017  . Bronchitis     LAST TIME WAS JAN 2014  . Chronic insomnia 11/14/2015  . Chronic left shoulder pain 01/19/2016  . Dysrhythmia   . Edema   . Elevated blood pressure reading   . History of blood transfusion   . History of kidney stones   . Morbid obesity (HCC) 11/14/2015  . Obstructive chronic bronchitis without exacerbation (HCC) 03/14/2008  . Osteoarthritis of knee 11/14/2015  . PONV (postoperative nausea and vomiting)   . Sleep apnea    cpap wears at times not nightly     Assessment/Plan: 2 Days Post-Op Procedure(s) (LRB): IRRIGATION AND DEBRIDEMENT KNEE WITH POLY EXCHANGE (Right) Active Problems:   Septic arthritis of knee, right (HCC)  Estimated body mass index is 42.61 kg/m as calculated from the following:   Height as of this encounter: 6\' 1"  (1.854 m).   Weight as of this encounter: 146.5 kg. Advance diet D/C IV fluids  DVT Prophylaxis - Xarelto Weight-bearing as tolerated  Intraop gram stain shows few WBC with no organisms seen. Culture shows no growth. Continue Ciprofloxacin 500 mg BID for one month.  Plan is to go home after hospital stay. Plan for discharge today. No PT today. Scheduled for outpatient PT at [facility] in [place] on Monday. Patient had some bleeding from the distal incision and drain site. We changed his dressing today, and instructed him to use 4x4s with an ACE bandage at home if needed.  Follow up in the office in 2 weeks.   Dennie Bible, PA-C Orthopedic Surgery 07/21/2018, 7:47 AM

## 2018-07-24 LAB — AEROBIC/ANAEROBIC CULTURE W GRAM STAIN (SURGICAL/DEEP WOUND): Culture: NO GROWTH

## 2018-07-25 NOTE — Discharge Summary (Signed)
Physician Discharge Summary   Patient ID: Brent Dougherty MRN: [id number] DOB/AGE: 67/05/1950 67 y.o.  Admit date: 07/19/2018 Discharge date: 07/21/2018  Primary Diagnosis: Infected right total knee arthroplasty   Admission Diagnoses:  Past Medical History:  Diagnosis Date   Arthritis    BOTH KNEES   Asthma    uncomplicated   Atrial fibrillation (HCC) 01/2017   Bronchitis    LAST TIME WAS JAN 2014   Chronic insomnia 11/14/2015   Chronic left shoulder pain 01/19/2016   Dysrhythmia    Edema    Elevated blood pressure reading    History of blood transfusion    History of kidney stones    Morbid obesity (HCC) 11/14/2015   Obstructive chronic bronchitis without exacerbation (HCC) 03/14/2008   Osteoarthritis of knee 11/14/2015   PONV (postoperative nausea and vomiting)    Sleep apnea    cpap wears at times not nightly    Discharge Diagnoses:   Active Problems:   Septic arthritis of knee, right (HCC)  Estimated body mass index is 42.61 kg/m as calculated from the following:   Height as of this encounter: 6\' 1"  (1.854 m).   Weight as of this encounter: 146.5 kg.  Procedure:  Procedure(s) (LRB): IRRIGATION AND DEBRIDEMENT KNEE WITH POLY EXCHANGE (Right)   Consults: None  HPI: The patient is a 68 year old male who had a right knee revision approximately a year ago.  He had issues with the wound healing superiorly at first.  He did not have any intraarticular swelling or issues and then recently presented with a knee effusion.  We aspirated it, and it was positive for bacteria.  He presents now for irrigation, debridement and polyethylene exchange and is here today for that procedure.  Laboratory Data: Admission on 07/19/2018, Discharged on 07/21/2018  Component Date Value Ref Range Status   Specimen Description 07/19/2018    Final                   Value:KNEE JOINT Performed at [facility], [address] [place], [place] [address]     Special Requests 07/19/2018    Final                   Value:NONE Performed at [facility], [address] [place], [place] [address]    Gram Stain 07/19/2018    Final                   Value:FEW WBC PRESENT, PREDOMINANTLY MONONUCLEAR NO ORGANISMS SEEN    Culture 07/19/2018    Final                   Value:No growth aerobically or anaerobically. Performed at [facility], [address] [place], [place] [address]    Report Status 07/19/2018 07/24/2018 FINAL   Final   WBC 07/20/2018 9.1  4.0 - 10.5 K/uL Final   RBC 07/20/2018 4.40  4.22 - 5.81 MIL/uL Final   Hemoglobin 07/20/2018 13.2  13.0 - 17.0 g/dL Final   HCT 21/30/865706/05/2018 39.5  39.0 - 52.0 % Final   MCV 07/20/2018 89.8  80.0 - 100.0 fL Final   MCH 07/20/2018 30.0  26.0 - 34.0 pg Final   MCHC 07/20/2018 33.4  30.0 - 36.0 g/dL Final   RDW 84/69/629506/05/2018 13.0  11.5 - 15.5 % Final   Platelets 07/20/2018 257  150 - 400 K/uL Final   nRBC 07/20/2018 0.0  0.0 - 0.2 %  Final   Performed at [facility], [address] [place], [place] [address]   Sodium 07/20/2018 134* 135 - 145 mmol/L Final   Potassium 07/20/2018 4.3  3.5 - 5.1 mmol/L Final   Chloride 07/20/2018 103  98 - 111 mmol/L Final   CO2 07/20/2018 22  22 - 32 mmol/L Final   Glucose, Bld 07/20/2018 150* 70 - 99 mg/dL Final   BUN 60/45/409806/05/2018 13  8 - 23 mg/dL Final   Creatinine, Ser 07/20/2018 0.83  0.61 - 1.24 mg/dL Final   Calcium 11/91/478206/05/2018 8.5* 8.9 - 10.3 mg/dL Final   GFR calc non Af Amer 07/20/2018 >60  >60 mL/min Final   GFR calc Af Amer 07/20/2018 >60  >60 mL/min Final   Anion gap 07/20/2018 9  5 - 15 Final   Performed at [facility], [address] [place], [place] [address]   WBC 07/21/2018 11.6* 4.0 - 10.5 K/uL Final   RBC 07/21/2018 4.17* 4.22 - 5.81 MIL/uL Final   Hemoglobin 07/21/2018 12.1* 13.0 - 17.0 g/dL Final   HCT 30/86/578406/06/2018 37.6* 39.0 - 52.0 % Final   MCV 07/21/2018  90.2  80.0 - 100.0 fL Final   MCH 07/21/2018 29.0  26.0 - 34.0 pg Final   MCHC 07/21/2018 32.2  30.0 - 36.0 g/dL Final   RDW 69/62/952806/06/2018 13.2  11.5 - 15.5 % Final   Platelets 07/21/2018 281  150 - 400 K/uL Final   nRBC 07/21/2018 0.0  0.0 - 0.2 % Final   Performed at [facility], [address] [place], [place] [address]   Sodium 07/21/2018 137  135 - 145 mmol/L Final   Potassium 07/21/2018 4.1  3.5 - 5.1 mmol/L Final   Chloride 07/21/2018 106  98 - 111 mmol/L Final   CO2 07/21/2018 25  22 - 32 mmol/L Final   Glucose, Bld 07/21/2018 135* 70 - 99 mg/dL Final   BUN 40/10/272506/06/2018 16  8 - 23 mg/dL Final   Creatinine, Ser 07/21/2018 0.77  0.61 - 1.24 mg/dL Final   Calcium 36/64/403406/06/2018 8.7* 8.9 - 10.3 mg/dL Final   GFR calc non Af Amer 07/21/2018 >60  >60 mL/min Final   GFR calc Af Amer 07/21/2018 >60  >60 mL/min Final   Anion gap 07/21/2018 6  5 - 15 Final   Performed at [facility], [address] [place], [place] [address]  Hospital Outpatient Visit on 07/17/2018  Component Date Value Ref Range Status   SARS-CoV-2, NAA 07/17/2018 NOT DETECTED  NOT DETECTED Final   Comment: (NOTE) This test was developed and its performance characteristics determined by [facility]. This test has not been FDA cleared or approved. This test has been authorized by FDA under an Emergency Use Authorization (EUA). This test is only authorized for the duration of time the declaration that circumstances exist justifying the authorization of the emergency use of in vitro diagnostic tests for detection of SARS-CoV-2 virus and/or diagnosis of COVID-19 infection under section 564(b)(1) of the Act, 21 U.S.C. 563OVF-6(E)(3360bbb-3(b)(1), unless the authorization is terminated or revoked sooner. When diagnostic testing is negative, the possibility of a false negative result should be considered in the context of a patient's recent exposures and the presence of clinical  signs and symptoms consistent with COVID-19. An individual without symptoms of COVID-19 and who is not shedding SARS-CoV-2 virus would expect to have a negative (not detected) result in this assay. Performed  At: [facility] [address] [place], [place] [address] Jolene Schimke MD VH:[id number]    Coronavirus Source 07/17/2018 NASOPHARYNGEAL   Final   Performed at [facility], [address], [place], [place] [address]  Hospital Outpatient Visit on 07/14/2018  Component Date Value Ref Range Status   aPTT 07/14/2018 36  24 - 36 seconds Final   Performed at [facility], [address] [place], [place] [address]   WBC 07/14/2018 7.6  4.0 - 10.5 K/uL Final   RBC 07/14/2018 5.30  4.22 - 5.81 MIL/uL Final   Hemoglobin 07/14/2018 15.5  13.0 - 17.0 g/dL Final   HCT 72/53/6644 47.9  39.0 - 52.0 % Final   MCV 07/14/2018 90.4  80.0 - 100.0 fL Final   MCH 07/14/2018 29.2  26.0 - 34.0 pg Final   MCHC 07/14/2018 32.4  30.0 - 36.0 g/dL Final   RDW 03/47/4259 13.0  11.5 - 15.5 % Final   Platelets 07/14/2018 358  150 - 400 K/uL Final   nRBC 07/14/2018 0.0  0.0 - 0.2 % Final   Performed at [facility], [address], [place], [place] [address]   Sodium 07/14/2018 136  135 - 145 mmol/L Final   Potassium 07/14/2018 4.5  3.5 - 5.1 mmol/L Final   Chloride 07/14/2018 100  98 - 111 mmol/L Final   CO2 07/14/2018 29  22 - 32 mmol/L Final   Glucose, Bld 07/14/2018 97  70 - 99 mg/dL Final   BUN 56/43/3295 13  8 - 23 mg/dL Final   Creatinine, Ser 07/14/2018 0.90  0.61 - 1.24 mg/dL Final   Calcium 18/84/1660 9.6  8.9 - 10.3 mg/dL Final   Total Protein 63/02/6008 8.2* 6.5 - 8.1 g/dL Final   Albumin 93/23/5573 4.1  3.5 - 5.0 g/dL Final   AST 22/03/5425 20  15 - 41 U/L Final   ALT 07/14/2018 16  0 - 44 U/L Final   Alkaline Phosphatase 07/14/2018 74  38 - 126 U/L Final   Total Bilirubin 07/14/2018  0.6  0.3 - 1.2 mg/dL Final   GFR calc non Af Amer 07/14/2018 >60  >60 mL/min Final   GFR calc Af Amer 07/14/2018 >60  >60 mL/min Final   Anion gap 07/14/2018 7  5 - 15 Final   Performed at [facility], [address] [place], [place] [address]   Prothrombin Time 07/14/2018 17.3* 11.4 - 15.2 seconds Final   INR 07/14/2018 1.4* 0.8 - 1.2 Final   Comment: (NOTE) INR goal varies based on device and disease states. Performed at [facility], [address] [place], [place] [address]    ABO/RH(D) 07/14/2018 A POS   Final   Antibody Screen 07/14/2018 NEG   Final   Sample Expiration 07/14/2018 07/22/2018,2359   Final   Extend sample reason 07/14/2018    Final                   Value:NO TRANSFUSIONS OR PREGNANCY IN THE PAST 3 MONTHS Performed at [facility], [address], [place], [place] [address]    MRSA, PCR 07/14/2018 NEGATIVE  NEGATIVE Final   Staphylococcus aureus 07/14/2018 NEGATIVE  NEGATIVE Final   Comment: (NOTE) The Xpert SA Assay (FDA approved for NASAL specimens in patients 46 years of age and older), is one component of a comprehensive surveillance program. It is not intended to diagnose infection nor to guide or monitor treatment. Performed at  [facility], [address] [place], [place] [address]      X-Rays:No results found.  EKG: Orders placed or performed in visit on 07/14/18   EKG 12-Lead     Hospital Course: Brent Dougherty is a 68 y.o. who was [facility] to [facility]. They were brought to the operating room on 07/19/2018 and underwent Procedure(s): IRRIGATION AND DEBRIDEMENT KNEE WITH POLY EXCHANGE.  Patient tolerated the procedure well and was later transferred to the recovery room and then to the orthopaedic floor for postoperative care. They were given PO and IV analgesics for pain control following their surgery. They were given 24 hours of postoperative  antibiotics of  Anti-infectives (From admission, onward)   Start     Dose/Rate Route Frequency Ordered Stop   07/20/18 0800  ciprofloxacin (CIPRO) tablet 500 mg  Status:  Discontinued     500 mg Oral 2 times daily 07/20/18 0625 07/21/18 1337   07/19/18 2000  ceFAZolin (ANCEF) IVPB 2g/100 mL premix     2 g 200 mL/hr over 30 Minutes Intravenous Every 6 hours 07/19/18 1657 07/20/18 0145   07/19/18 1200  ceFAZolin (ANCEF) IVPB 2g/100 mL premix     2 g 200 mL/hr over 30 Minutes Intravenous On call to O.R. 07/19/18 1146 07/19/18 1410     and started on DVT prophylaxis in the form of Xarelto.   PT and OT were ordered for total joint protocol. Discharge planning consulted to help with postop disposition and equipment needs. Patient had a good night on the evening of surgery. They started to get up OOB with therapy on POD #1. Hemovac drain was pulled without difficulty on day one. Per nursing, patient had some bleeding from his distal incision and drain site. Dressing was changed and reinforced without difficulty. Continued to work with therapy into POD #2. Pt was seen during rounds on day two and was ready to go home. Dressing was changed and the incision was clean and intact. He was discharged to home later that day in stable condition.  Diet: Regular diet Activity: WBAT Follow-up: in 2 weeks Disposition: Home Discharged Condition: good   Discharge Instructions    Call MD / Call 911   Complete by:  As directed    If you experience chest pain or shortness of breath, CALL 911 and be transported to the hospital emergency room.  If you develope a fever above 101 F, pus (white drainage) or increased drainage or redness at the wound, or calf pain, call your surgeon's office.   Change dressing   Complete by:  As directed    Change the dressing daily with sterile 4 x 4 inch gauze dressing and apply TED hose.   Constipation Prevention   Complete by:  As directed    Drink plenty of fluids.  Prune juice  may be helpful.  You may use a stool softener, such as Colace (over the counter) 100 mg twice a day.  Use MiraLax (over the counter) for constipation as needed.   Diet - low sodium heart healthy   Complete by:  As directed    Discharge instructions   Complete by:  As directed    Dr. Ollen GrossFrank Aluisio Total Joint Specialist [facility] [address] [place], [place] [address] [phone]  TOTAL KNEE REPLACEMENT POSTOPERATIVE DIRECTIONS  Knee Rehabilitation, Guidelines Following Surgery  Results after knee surgery are often greatly improved when you follow the exercise, range of motion and muscle strengthening exercises  prescribed by your doctor. Safety measures are also important to protect the knee from further injury. Any time any of these exercises cause you to have increased pain or swelling in your knee joint, decrease the amount until you are comfortable again and slowly increase them. If you have problems or questions, call your caregiver or physical therapist for advice.   HOME CARE INSTRUCTIONS  Remove items at home which could result in a fall. This includes throw rugs or furniture in walking pathways.  ICE to the affected knee every three hours for 30 minutes at a time and then as needed for pain and swelling.  Continue to use ice on the knee for pain and swelling from surgery. You may notice swelling that will progress down to the foot and ankle.  This is normal after surgery.  Elevate the leg when you are not up walking on it.   Continue to use the breathing machine which will help keep your temperature down.  It is common for your temperature to cycle up and down following surgery, especially at night when you are not up moving around and exerting yourself.  The breathing machine keeps your lungs expanded and your temperature down. Do not place pillow under knee, focus on keeping the knee straight while resting   DIET You may resume your previous home diet once your  are discharged from the hospital.  DRESSING / WOUND CARE / SHOWERING You may change your dressing 3-5 days after surgery.  Then change the dressing every day with sterile gauze.  Please use good hand washing techniques before changing the dressing.  Do not use any lotions or creams on the incision until instructed by your surgeon. You may start showering once you are discharged home but do not submerge the incision under water. Just pat the incision dry and apply a dry gauze dressing on daily. Change the surgical dressing daily and reapply a dry dressing each time.  ACTIVITY Walk with your walker as instructed. Use walker as long as suggested by your caregivers. Avoid periods of inactivity such as sitting longer than an hour when not asleep. This helps prevent blood clots.  You may resume a sexual relationship in one month or when given the OK by your doctor.  You may return to work once you are cleared by your doctor.  Do not drive a car for 6 weeks or until released by you surgeon.  Do not drive while taking narcotics.  WEIGHT BEARING Weight bearing as tolerated with assist device (walker, cane, etc) as directed, use it as long as suggested by your surgeon or therapist, typically at least 4-6 weeks.  POSTOPERATIVE CONSTIPATION PROTOCOL Constipation - defined medically as fewer than three stools per week and severe constipation as less than one stool per week.  One of the most common issues patients have following surgery is constipation.  Even if you have a regular bowel pattern at home, your normal regimen is likely to be disrupted due to multiple reasons following surgery.  Combination of anesthesia, postoperative narcotics, change in appetite and fluid intake all can affect your bowels.  In order to avoid complications following surgery, here are some recommendations in order to help you during your recovery period.  Colace (docusate) - Pick up an over-the-counter form of Colace or  another stool softener and take twice a day as long as you are requiring postoperative pain medications.  Take with a full glass of water daily.  If you experience loose stools  or diarrhea, hold the colace until you stool forms back up.  If your symptoms do not get better within 1 week or if they get worse, check with your doctor.  Dulcolax (bisacodyl) - Pick up over-the-counter and take as directed by the product packaging as needed to assist with the movement of your bowels.  Take with a full glass of water.  Use this product as needed if not relieved by Colace only.   MiraLax (polyethylene glycol) - Pick up over-the-counter to have on hand.  MiraLax is a solution that will increase the amount of water in your bowels to assist with bowel movements.  Take as directed and can mix with a glass of water, juice, soda, coffee, or tea.  Take if you go more than two days without a movement. Do not use MiraLax more than once per day. Call your doctor if you are still constipated or irregular after using this medication for 7 days in a row.  If you continue to have problems with postoperative constipation, please contact the office for further assistance and recommendations.  If you experience "the worst abdominal pain ever" or develop nausea or vomiting, please contact the office immediatly for further recommendations for treatment.  ITCHING  If you experience itching with your medications, try taking only a single pain pill, or even half a pain pill at a time.  You can also use Benadryl over the counter for itching or also to help with sleep.   TED HOSE STOCKINGS Wear the elastic stockings on both legs for three weeks following surgery during the day but you may remove then at night for sleeping.  MEDICATIONS See your medication summary on the "After Visit Summary" that the nursing staff will review with you prior to discharge.  You may have some home medications which will be placed on hold until you  complete the course of blood thinner medication.  It is important for you to complete the blood thinner medication as prescribed by your surgeon.  Continue your approved medications as instructed at time of discharge.  PRECAUTIONS If you experience chest pain or shortness of breath - call 911 immediately for transfer to the hospital emergency department.  If you develop a fever greater that 101 F, purulent drainage from wound, increased redness or drainage from wound, foul odor from the wound/dressing, or calf pain - CONTACT YOUR SURGEON.                                                   FOLLOW-UP APPOINTMENTS Make sure you keep all of your appointments after your operation with your surgeon and caregivers. You should call the office at the above phone number and make an appointment for approximately two weeks after the date of your surgery or on the date instructed by your surgeon outlined in the "After Visit Summary".   RANGE OF MOTION AND STRENGTHENING EXERCISES  Rehabilitation of the knee is important following a knee injury or an operation. After just a few days of immobilization, the muscles of the thigh which control the knee become weakened and shrink (atrophy). Knee exercises are designed to build up the tone and strength of the thigh muscles and to improve knee motion. Often times heat used for twenty to thirty minutes before working out will loosen up your tissues and help with improving the  range of motion but do not use heat for the first two weeks following surgery. These exercises can be done on a training (exercise) mat, on the floor, on a table or on a bed. Use what ever works the best and is most comfortable for you Knee exercises include:  Leg Lifts - While your knee is still immobilized in a splint or cast, you can do straight leg raises. Lift the leg to 60 degrees, hold for 3 sec, and slowly lower the leg. Repeat 10-20 times 2-3 times daily. Perform this exercise against resistance  later as your knee gets better.  Quad and Hamstring Sets - Tighten up the muscle on the front of the thigh (Quad) and hold for 5-10 sec. Repeat this 10-20 times hourly. Hamstring sets are done by pushing the foot backward against an object and holding for 5-10 sec. Repeat as with quad sets.  Leg Slides: Lying on your back, slowly slide your foot toward your buttocks, bending your knee up off the floor (only go as far as is comfortable). Then slowly slide your foot back down until your leg is flat on the floor again. Angel Wings: Lying on your back spread your legs to the side as far apart as you can without causing discomfort.  A rehabilitation program following serious knee injuries can speed recovery and prevent re-injury in the future due to weakened muscles. Contact your doctor or a physical therapist for more information on knee rehabilitation.   IF YOU ARE TRANSFERRED TO A SKILLED REHAB FACILITY If the patient is transferred to a skilled rehab facility following release from the hospital, a list of the current medications will be sent to the facility for the patient to continue.  When discharged from the skilled rehab facility, please have the facility set up the patient's [place] prior to being released. Also, the skilled facility will be responsible for providing the patient with their medications at time of release from the facility to include their pain medication, the muscle relaxants, and their blood thinner medication. If the patient is still at the rehab facility at time of the two week follow up appointment, the skilled rehab facility will also need to assist the patient in arranging follow up appointment in our office and any transportation needs.  MAKE SURE YOU:  Understand these instructions.  Get help right away if you are not doing well or get worse.    Pick up stool softner and laxative for home use following surgery while on pain medications. Do not submerge  incision under water. Please use good hand washing techniques while changing dressing each day. May shower starting three days after surgery. Please use a clean towel to pat the incision dry following showers. Continue to use ice for pain and swelling after surgery. Do not use any lotions or creams on the incision until instructed by your surgeon.   Do not put a pillow under the knee. Place it under the heel.   Complete by:  As directed    Driving restrictions   Complete by:  As directed    No driving for two weeks   TED hose   Complete by:  As directed    Use stockings (TED hose) for three weeks on both leg(s).  You may remove them at night for sleeping.   Weight bearing as tolerated   Complete by:  As directed      Allergies as of 07/21/2018      Reactions  Morphine And Related Other (See Comments)   MILD NAUSEA      Medication List    TAKE these medications   ciprofloxacin 500 MG tablet Commonly known as:  CIPRO Take 500 mg by mouth 2 (two) times a day.   diltiazem 120 MG 24 hr capsule Commonly known as:  CARDIZEM CD Take 120 mg by mouth daily.   diphenhydramine-acetaminophen 25-500 MG Tabs tablet Commonly known as:  TYLENOL PM Take 3 tablets by mouth at bedtime as needed (sleep).   flecainide 50 MG tablet Commonly known as:  TAMBOCOR TAKE ONE AND ONE-HALF TABLETS TWICE A DAY What changed:  See the new instructions.   hydrochlorothiazide 12.5 MG capsule Commonly known as:  MICROZIDE TAKE 1 CAPSULE TWICE A DAY   methocarbamol 500 MG tablet Commonly known as:  ROBAXIN Take 1 tablet (500 mg total) by mouth every 6 (six) hours as needed for muscle spasms.   metoprolol tartrate 25 MG tablet Commonly known as:  LOPRESSOR TAKE 1 TABLET TWICE A DAY   oxyCODONE 5 MG immediate release tablet Commonly known as:  Oxy IR/ROXICODONE Take 1-2 tablets (5-10 mg total) by mouth every 6 (six) hours as needed for moderate pain (pain score 4-6).   traMADol 50 MG  tablet Commonly known as:  ULTRAM Take 1-2 tablets (50-100 mg total) by mouth every 6 (six) hours as needed for moderate pain (use oxycodone 1st).   Xarelto 20 MG Tabs tablet Generic drug:  rivaroxaban TAKE 1 TABLET DAILY WITH SUPPER What changed:  See the new instructions.   zolpidem 10 MG tablet Commonly known as:  AMBIEN 1/2 or 1 tab at bedtime for sleep as needed What changed:    how much to take  how to take this  when to take this  additional instructions            Discharge Care Instructions  (From admission, onward)         Start     Ordered   07/20/18 0000  Weight bearing as tolerated     07/20/18 0734   07/20/18 0000  Change dressing    Comments:  Change the dressing daily with sterile 4 x 4 inch gauze dressing and apply TED hose.   07/20/18 0734         Follow-up Information    [name], MD. Schedule an appointment as soon as possible for a visit on 08/01/2018.   Specialty:  Orthopedic Surgery Contact information: [address] [address] [place] [address] [phone]           Signed: Dennie Bible, PA-C Orthopedic Surgery 07/25/2018, 7:12 AM

## 2018-08-02 ENCOUNTER — Telehealth: Payer: Self-pay | Admitting: Cardiovascular Disease

## 2018-08-02 NOTE — Telephone Encounter (Signed)
Pt c/o medication issue:  1. Name of Medication: Diltiazem  2. How are you currently taking this medication (dosage and times per day)? 1 time a day  3. Are you having a reaction (difficulty breathing--STAT)? no  4. What is your medication issue? Rash all over his body, hands fingers and ankles,toes swollen- he can hardly walk

## 2018-08-02 NOTE — Telephone Encounter (Signed)
Called patient back about Dr. Kyla Balzarine recommendations. Informed patient to stop cardizem, if pulse seems high or BP is high to take an extra lopressor. Informed patient that Dr. Johnsie Cancel suspect the rash is from cardizem. Patient verbalized understanding and will take an extra lopressor if he pulse or BP is too high. Encouraged patient to call our office if he is having to take an extra lopressor all the time.

## 2018-08-02 NOTE — Telephone Encounter (Signed)
Called patient back about his message. Patient complaining about a rash between his fingers, on his wrist, on his feet, that started Tuesday morning when he woke up, now has spread to his back and has resulted in swollen joints that makes it hard for patient to move. Patient called Dr. Wynelle Link yesterday because he thought it might be related to recent surgery. Patient stated Dr. Wynelle Link ruled out that it was any infection causing the rash or related to surgery. Patient stated Dr. Wynelle Link prescribed patient benadryl and told him if it did not get better he would prescribe prednisone. Patient stated the only thing that has changed since Monday is his Diltiazem medication. Patient stated his Diltiazem came from a different pharmacy and was a different manufacturer than his previous Diltiazem. Told patient not to take the diltiazem until he hears back from our office that Dr. Johnsie Cancel would be notified and see if there is something else he can take. Encouraged patient to follow up with Dr. Wynelle Link with his worsening symptoms and to contact his PCP to let him know what is going on. Will forward to Dr. Johnsie Cancel for advisement.

## 2018-08-02 NOTE — Telephone Encounter (Signed)
Just stop cardizem if pulse seems high or BP high take extra lopressor Suspect rash is from cardizem

## 2018-08-14 ENCOUNTER — Other Ambulatory Visit (HOSPITAL_COMMUNITY)
Admission: RE | Admit: 2018-08-14 | Discharge: 2018-08-14 | Disposition: A | Discharge status: Home / Self Care | Payer: BC Managed Care – PPO | Source: Ambulatory Visit | Attending: Orthopedic Surgery | Admitting: Orthopedic Surgery

## 2018-08-14 DIAGNOSIS — Z01812 Encounter for preprocedural laboratory examination: Secondary | ICD-10-CM | POA: Diagnosis not present

## 2018-08-14 DIAGNOSIS — Z1159 Encounter for screening for other viral diseases: Secondary | ICD-10-CM | POA: Insufficient documentation

## 2018-08-14 LAB — SARS CORONAVIRUS 2 (TAT 6-24 HRS): SARS Coronavirus 2: NEGATIVE

## 2018-08-14 NOTE — Patient Instructions (Addendum)
Brent Dougherty    Your procedure is scheduled on: 08-16-2018  Report to [facility] Main  Entrance  Report to admitting at  310 PM   [place] [facility] TEST ON_______ @_______ , THIS TEST MUST BE DONE BEFORE SURGERY, COME TO [place]. ONCE YOUR COVID TEST IS COMPLETED, PLEASE BEGIN THE QUARANTINE INSTRUCTIONS AS OUTLINED IN YOUR HANDOUT.   Call this number if you have problems the morning of surgery [phone]    Remember: [place], NO CHEWING GUM CANDY OR MINTS.   NO SOLID FOOD AFTER MIDNIGHT THE NIGHT PRIOR TO SURGERY. NOTHING BY MOUTH EXCEPT CLEAR LIQUIDS UNTIL 210 PM. PLEASE FINISH ENSURE DRINK PER SURGEON ORDER 3 HOURS PRIOR TO SCHEDULED SURGERY TIME WHICH NEEDS TO BE COMPLETED AT 210 PM.  CLEAR LIQUID DIET   Foods Allowed                                                                     Foods Excluded  Coffee and tea, regular and decaf                             liquids that you cannot  Plain Jell-O in any flavor                                             see through such as: Fruit ices (not with fruit pulp)                                     milk, soups, orange juice  Iced Popsicles                                    All solid food Carbonated beverages, regular and diet                                    Cranberry, grape and apple juices Sports drinks like Gatorade Lightly seasoned clear broth or consume(fat free) Sugar, honey syrup  Sample Menu Breakfast                                Lunch                                     Supper Cranberry juice                    Beef broth  Chicken broth Jell-O                                     Grape juice                           Apple juice Coffee or tea                        Jell-O                                      Popsicle          Coffee or tea                        Coffee or tea  _____________________________________________________________________    Take these medicines the morning of surgery with A SIP OF WATER: FLECAINIDE, METOPROLOL                                You may not have any metal on your body including hair pins and              piercings  Do not wear jewelry, make-up, lotions, powders or perfumes, deodorant                      Men may shave face and neck.   Do not bring valuables to the hospital. [facility] IS NOT             RESPONSIBLE   FOR VALUABLES.  Contacts, dentures or bridgework may not be worn into surgery.  Leave suitcase in the car. After surgery it may be brought to your room.   BRING CPAP MASK AND TUBING DAY OF SURGERY _____________________________________________________________________             [facility] Before surgery, you can play an important role.  Because skin is not sterile, your skin needs to be as free of germs as possible.  You can reduce the number of germs on your skin by washing with CHG (chlorahexidine gluconate) soap before surgery.  CHG is an antiseptic cleaner which kills germs and bonds with the skin to continue killing germs even after washing. Please DO NOT use if you have an allergy to CHG or antibacterial soaps.  If your skin becomes reddened/irritated stop using the CHG and inform your nurse when you arrive at Short Stay. Do not shave (including legs and underarms) for at least 48 hours prior to the first CHG shower.  You may shave your face/neck. Please follow these instructions carefully:  1.  Shower with CHG Soap the night before surgery and the  morning of Surgery.  2.  If you choose to wash your hair, wash your hair first as usual with your  normal  shampoo.  3.  After you shampoo, rinse your hair and body thoroughly to remove the  shampoo.                           4.  Use CHG as you would any other liquid soap.   You can apply chg  directly  to the skin and wash                       Gently with a scrungie or clean washcloth.  5.  Apply the CHG Soap to your body ONLY FROM THE NECK DOWN.   Do not use on face/ open                           Wound or open sores. Avoid contact with eyes, ears mouth and genitals (private parts).                       Wash face,  Genitals (private parts) with your normal soap.             6.  Wash thoroughly, paying special attention to the area where your surgery  will be performed.  7.  Thoroughly rinse your body with warm water from the neck down.  8.  DO NOT shower/wash with your normal soap after using and rinsing off  the CHG Soap.                9.  Pat yourself dry with a clean towel.            10.  Wear clean pajamas.            11.  Place clean sheets on your bed the night of your first shower and do not  sleep with pets. Day of Surgery : Do not apply any lotions/deodorants the morning of surgery.  Please wear clean clothes to the hospital/surgery center.  FAILURE TO FOLLOW THESE INSTRUCTIONS MAY RESULT IN THE CANCELLATION OF YOUR SURGERY PATIENT SIGNATURE_________________________________  NURSE SIGNATURE__________________________________  ________________________________________________________________________   Brent MireIncentive Spirometer  An incentive spirometer is a tool that can help keep your lungs clear and active. This tool measures how well you are filling your lungs with each breath. Taking long deep breaths may help reverse or decrease the chance of developing breathing (pulmonary) problems (especially infection) following:  A long period of time when you are unable to move or be active. BEFORE THE PROCEDURE   If the spirometer includes an indicator to show your best effort, your nurse or respiratory therapist will set it to a desired goal.  If possible, sit up straight or lean slightly forward. Try not to slouch.  Hold the incentive spirometer in an  upright position. INSTRUCTIONS FOR USE  1. Sit on the edge of your bed if possible, or sit up as far as you can in bed or on a chair. 2. Hold the incentive spirometer in an upright position. 3. Breathe out normally. 4. Place the mouthpiece in your mouth and seal your lips tightly around it. 5. Breathe in slowly and as deeply as possible, raising the piston or the ball toward the top of the column. 6. Hold your breath for 3-5 seconds or for as long as possible. Allow the piston or ball to fall to the bottom of the column. 7. Remove the mouthpiece from your mouth and breathe out normally. 8. Rest for a few seconds and repeat Steps 1 through 7 at least 10 times every 1-2 hours when you are awake. Take your time and take a few normal breaths between deep breaths. 9. The spirometer may include an indicator to show your best effort. Use the indicator as a goal to work toward during each repetition. 10.  After each set of 10 deep breaths, practice coughing to be sure your lungs are clear. If you have an incision (the cut made at the time of surgery), support your incision when coughing by placing a pillow or rolled up towels firmly against it. Once you are able to get out of bed, walk around indoors and cough well. You may stop using the incentive spirometer when instructed by your caregiver.  RISKS AND COMPLICATIONS  Take your time so you do not get dizzy or light-headed.  If you are in pain, you may need to take or ask for pain medication before doing incentive spirometry. It is harder to take a deep breath if you are having pain. AFTER USE  Rest and breathe slowly and easily.  It can be helpful to keep track of a log of your progress. Your caregiver can provide you with a simple table to help with this. If you are using the spirometer at home, follow these instructions: SEEK MEDICAL CARE IF:   You are having difficultly using the spirometer.  You have trouble using the spirometer as often as  instructed.  Your pain medication is not giving enough relief while using the spirometer.  You develop fever of 100.5 F (38.1 C) or higher. SEEK IMMEDIATE MEDICAL CARE IF:   You cough up bloody sputum that had not been present before.  You develop fever of 102 F (38.9 C) or greater.  You develop worsening pain at or near the incision site. MAKE SURE YOU:   Understand these instructions.  Will watch your condition.  Will get help right away if you are not doing well or get worse. Document Released: 06/14/2006 Document Revised: 04/26/2011 Document Reviewed: 08/15/2006 [facility] Patient Information 2014 [employer] [place].   ________________________________________________________________________

## 2018-08-15 ENCOUNTER — Encounter (HOSPITAL_COMMUNITY): Payer: Self-pay

## 2018-08-15 ENCOUNTER — Other Ambulatory Visit (HOSPITAL_COMMUNITY): Payer: BC Managed Care – PPO

## 2018-08-15 ENCOUNTER — Encounter (HOSPITAL_COMMUNITY)
Admission: RE | Admit: 2018-08-15 | Discharge: 2018-08-15 | Disposition: A | Discharge status: Home / Self Care | Payer: No Typology Code available for payment source | Source: Ambulatory Visit | Attending: Orthopedic Surgery | Admitting: Orthopedic Surgery

## 2018-08-15 ENCOUNTER — Other Ambulatory Visit: Payer: Self-pay

## 2018-08-15 DIAGNOSIS — Z79899 Other long term (current) drug therapy: Secondary | ICD-10-CM | POA: Diagnosis not present

## 2018-08-15 DIAGNOSIS — Z7901 Long term (current) use of anticoagulants: Secondary | ICD-10-CM | POA: Diagnosis not present

## 2018-08-15 DIAGNOSIS — X58XXXA Exposure to other specified factors, initial encounter: Secondary | ICD-10-CM | POA: Diagnosis not present

## 2018-08-15 DIAGNOSIS — M1990 Unspecified osteoarthritis, unspecified site: Secondary | ICD-10-CM | POA: Diagnosis not present

## 2018-08-15 DIAGNOSIS — J449 Chronic obstructive pulmonary disease, unspecified: Secondary | ICD-10-CM | POA: Diagnosis not present

## 2018-08-15 DIAGNOSIS — Z6839 Body mass index (BMI) 39.0-39.9, adult: Secondary | ICD-10-CM | POA: Diagnosis not present

## 2018-08-15 DIAGNOSIS — S76111A Strain of right quadriceps muscle, fascia and tendon, initial encounter: Secondary | ICD-10-CM | POA: Diagnosis present

## 2018-08-15 DIAGNOSIS — I4891 Unspecified atrial fibrillation: Secondary | ICD-10-CM | POA: Diagnosis not present

## 2018-08-15 DIAGNOSIS — G4733 Obstructive sleep apnea (adult) (pediatric): Secondary | ICD-10-CM | POA: Diagnosis not present

## 2018-08-15 HISTORY — DX: Obstructive sleep apnea (adult) (pediatric): G47.33

## 2018-08-15 LAB — BASIC METABOLIC PANEL
Anion gap: 8 (ref 5–15)
BUN: 16 mg/dL (ref 8–23)
CO2: 29 mmol/L (ref 22–32)
Calcium: 9.2 mg/dL (ref 8.9–10.3)
Chloride: 99 mmol/L (ref 98–111)
Creatinine, Ser: 0.98 mg/dL (ref 0.61–1.24)
GFR calc Af Amer: >60 mL/min (ref >60)
GFR calc non Af Amer: >60 mL/min (ref >60)
Glucose, Bld: 92 mg/dL (ref 70–99)
Potassium: 4.3 mmol/L (ref 3.5–5.1)
Sodium: 136 mmol/L (ref 135–145)

## 2018-08-15 LAB — CBC
HCT: 43 % (ref 39.0–52.0)
Hemoglobin: 14 g/dL (ref 13.0–17.0)
MCH: 29.7 pg (ref 26.0–34.0)
MCHC: 32.6 g/dL (ref 30.0–36.0)
MCV: 91.1 fL (ref 80.0–100.0)
Platelets: 316 10*3/uL (ref 150–400)
RBC: 4.72 MIL/uL (ref 4.22–5.81)
RDW: 13.4 % (ref 11.5–15.5)
WBC: 7 10*3/uL (ref 4.0–10.5)
nRBC: 0 % (ref 0.0–0.2)

## 2018-08-15 MED ORDER — ENSURE PRE-SURGERY PO LIQD
296.0000 mL | Freq: Once | ORAL | Status: DC
  Filled 2018-08-15: qty 296

## 2018-08-15 MED ORDER — DEXTROSE 5 % IV SOLN
3.0000 g | INTRAVENOUS | Status: DC
  Filled 2018-08-15: qty 3000

## 2018-08-15 NOTE — Anesthesia Preprocedure Evaluation (Deleted)
Anesthesia Evaluation                                  Anesthesia Evaluation  Patient identified by MRN, date of birth, ID band Patient awake    Reviewed: Allergy & Precautions, NPO status , Patient's Chart, lab work & pertinent test results  History of Anesthesia Complications (+) PONV and history of anesthetic complications  Airway Mallampati: III  TM Distance: >3 FB Neck ROM: Full    Dental no notable dental hx.    Pulmonary asthma , sleep apnea and Continuous Positive Airway Pressure Ventilation , COPD,    Pulmonary exam normal        Cardiovascular Normal cardiovascular exam+ dysrhythmias (on Xarelto, last dose 07/15/18) Atrial Fibrillation      Neuro/Psych negative neurological ROS     GI/Hepatic negative GI ROS, Neg liver ROS,   Endo/Other  negative endocrine ROS  Renal/GU negative Renal ROS     Musculoskeletal  (+) Arthritis , Right knee PJI   Abdominal   Peds  Hematology negative hematology ROS (+)   Anesthesia Other Findings Day of surgery medications reviewed with the patient.  Reproductive/Obstetrics                            Anesthesia Physical Anesthesia Plan  ASA: III  Anesthesia Plan: Spinal   Post-op Pain Management:  Regional for Post-op pain   Induction:   PONV Risk Score and Plan: 3 and Treatment may vary due to age or medical condition, Ondansetron, Midazolam, Scopolamine patch - Pre-op and Dexamethasone  Airway Management Planned: Natural Airway and Nasal Cannula  Additional Equipment:   Intra-op Plan:   Post-operative Plan:   Informed Consent: I have reviewed the patients History and Physical, chart, labs and discussed the procedure including the risks, benefits and alternatives for the proposed anesthesia with the patient or authorized representative who has indicated his/her understanding and acceptance.     Dental advisory given  Plan Discussed with: CRNA  Anesthesia Plan  Comments: (See PAT note 07/14/2018, Konrad Felix, PA-C)      Anesthesia Quick Evaluation  Anesthesia Physical Anesthesia Plan  ASA:   Anesthesia Plan:    Post-op Pain Management:    Induction:   PONV Risk Score and Plan:   Airway Management Planned:   Additional Equipment:   Intra-op Plan:   Post-operative Plan:   Informed Consent:   Plan Discussed with:   Anesthesia Plan Comments: (See PAT note 08/15/2018, Konrad Felix, PA-C)        Anesthesia Quick Evaluation

## 2018-08-15 NOTE — Progress Notes (Signed)
SPOKE W/  Brent Dougherty     SCREENING SYMPTOMS OF COVID 19:   COUGH--NO  RUNNY NOSE--- NO  SORE THROAT---NO  NASAL CONGESTION----NO  SNEEZING----NO  SHORTNESS OF BREATH---NO  DIFFICULTY BREATHING---NO  TEMP >100.0 -----NO  UNEXPLAINED BODY ACHES------NO  CHILLS -------- NO  HEADACHES ---------NO  LOSS OF SMELL/ TASTE --------NO    HAVE YOU OR ANY FAMILY MEMBER TRAVELLED PAST 14 DAYS OUT OF THE   COUNTY---[place] IN [place]----NO COUNTRY----NO  HAVE YOU OR ANY FAMILY MEMBER BEEN EXPOSED TO ANYONE WITH COVID 19? NO

## 2018-08-15 NOTE — Progress Notes (Signed)
Anesthesia Chart Review   Case: [id number] Date/Time: 08/16/18 1655   Procedure: KNEE REPAIR EXTENSOR MECHANISM (Right ) - 60min   Anesthesia type: Choice   Pre-op diagnosis: Extensor mechanism tear right knee   [facility] ROOM 09 / [facility]   Surgeon: Brent GrossAluisio, Frank, MD      DISCUSSION:67 y.o. never smoker with Dougherty/o PONV, asthma, chronic bronchitis, A-fib (on Xarelto), OSA on CPAP, extensor mechanism tear right knee scheduled for above procedure 08/16/2018 with Dr. Ollen GrossFrank Dougherty.   S/p right knee I&D with ply exchange 07/19/2018 with no anesthesia complications noted.  Prior to this procedure cleared by cardiology.  Per Berton BonJanine Hammond, NP on 07/05/2018, ""Brent CritchleyPaul W Marshallwas last seen on 11/21/2019by Dr. Eden EmmsNishan. Since that day, Anselmo Rodaul W Marshallhas done well.He has atrial fibrillation treated with flecainide. Stress test and echo were normal. Pt feels that he continues to be in afib as it limits his exercise tolerance somewhat. Dr. Eden EmmsNishan will see him the office after his knee procedure to further discuss heart rhythm treatment.Therefore, based on ACC/AHA guidelines, the patient would be at acceptable risk for the planned procedure without further cardiovascular testing.Per office protocol, patient can holdXareltofor 2days prior to procedure."  Last dose Xarelto reported to nurse 08/12/2018.  Anticipate pt can proceed with planned procedure barring acute status change.   VS: BP 132/89 (BP Location: Left Arm)   Pulse 87   Temp 36.8 C (Oral)   Resp 18   Ht 6\' 3"  (1.905 m)   Wt (!) 142.1 kg   SpO2 97%   BMI 39.15 kg/m   PROVIDERS: Brent BannerWilson, Brent H, MD is PCP    LABS: Labs reviewed: Acceptable for surgery. (all labs ordered are listed, but only abnormal results are displayed)  Labs Reviewed  BASIC METABOLIC PANEL  CBC     IMAGES:   EKG: 07/14/2018 Rate 68 bpm Atrial fibrillation  Replaced NSR Since previous tracing 06/13/12 Left axis deviation new   CV: Echo  01/27/17 Study Conclusions  - Left ventricle: The cavity size was normal. Wall thickness was   normal. Systolic function was normal. The estimated ejection   fraction was in the range of 55% to 60%. Wall motion was normal;   there were no regional wall motion abnormalities. - Aortic valve: Mildly calcified annulus.  Impressions:  - technically difficult echo with generally poor image quality.   Definity contrast was used to assess the LV function .  Stress Test 06/17/17  Blood pressure demonstrated a hypertensive response to exercise.  There was no ST segment deviation noted during stress.  Exercise capacity moderately impaired.  No ischemic changes noted.  Target heart rate nearly achieved (81%). Past Medical History:  Diagnosis Date  . Arthritis    BOTH KNEES  . Asthma    uncomplicated  . Atrial fibrillation (HCC) 01/2017  . Bronchitis    LAST TIME WAS JAN 2014  . Chronic insomnia 11/14/2015  . Chronic left shoulder pain 01/19/2016  . Dysrhythmia   . Edema   . Elevated blood pressure reading   . History of blood transfusion 07/27/2017  . History of kidney stones   . Morbid obesity (HCC) 11/14/2015  . Obstructive chronic bronchitis without exacerbation (HCC) 03/14/2008  . OSA on CPAP   . Osteoarthritis of knee 11/14/2015  . PONV (postoperative nausea and vomiting)     Past Surgical History:  Procedure Laterality Date  . BILATERAL KNEE ARTHROSCOPY    . CARDIOVERSION N/A 03/08/2017   Procedure: CARDIOVERSION;  Surgeon: Brent EmmsNishan,  Brent Bamberg, MD;  Location: [facility] ENDOSCOPY;  Service: Cardiovascular;  Laterality: N/A;  . I&D KNEE WITH POLY EXCHANGE Right 07/19/2018   Procedure: IRRIGATION AND DEBRIDEMENT KNEE WITH POLY EXCHANGE;  Surgeon: Brent Arabian, MD;  Location: [facility];  Service: Orthopedics;  Laterality: Right;  . PARTIAL AMPUTATION LEFT THUMB AND TENDON REPAIR    . TONSILLECTOMY AND ADENOIDECTOMY    . TOTAL KNEE ARTHROPLASTY Right 06/19/2012   Procedure: RIGHT TOTAL  KNEE ARTHROPLASTY;  Surgeon: Gearlean Alf, MD;  Location[facility];  Service: Orthopedics;  Laterality: Right;  . TOTAL KNEE REVISION Right 07/27/2017   Procedure: RIGHT KNEE POLYETHYLENE REVISION;  Surgeon: Brent Arabian, MD;  Location: [facility];  Service: Orthopedics;  Laterality: Right;  Adductor Block    MEDICATIONS: . ciprofloxacin (CIPRO) 500 MG tablet  . diphenhydramine-acetaminophen (TYLENOL PM) 25-500 MG TABS tablet  . flecainide (TAMBOCOR) 50 MG tablet  . hydrochlorothiazide (MICROZIDE) 12.5 MG capsule  . methocarbamol (ROBAXIN) 500 MG tablet  . metoprolol tartrate (LOPRESSOR) 25 MG tablet  . oxyCODONE (OXY IR/ROXICODONE) 5 MG immediate release tablet  . traMADol (ULTRAM) 50 MG tablet  . XARELTO 20 MG TABS tablet  . zolpidem (AMBIEN) 10 MG tablet   . [START ON 08/16/2018] ceFAZolin (ANCEF) 3 g in dextrose 5 % 50 mL IVPB  . feeding supplement (ENSURE PRE-SURGERY) liquid 296 mL

## 2018-08-16 ENCOUNTER — Ambulatory Visit (HOSPITAL_COMMUNITY): Payer: No Typology Code available for payment source | Admitting: Physician Assistant

## 2018-08-16 ENCOUNTER — Encounter (HOSPITAL_COMMUNITY): Payer: Self-pay | Admitting: General Practice

## 2018-08-16 ENCOUNTER — Ambulatory Visit (HOSPITAL_COMMUNITY): Payer: No Typology Code available for payment source | Admitting: Anesthesiology

## 2018-08-16 ENCOUNTER — Other Ambulatory Visit: Payer: Self-pay

## 2018-08-16 ENCOUNTER — Observation Stay (HOSPITAL_COMMUNITY)
Admission: RE | Admit: 2018-08-16 | Discharge: 2018-08-17 | Disposition: A | Discharge status: Home / Self Care | Payer: No Typology Code available for payment source | Attending: Orthopedic Surgery | Admitting: Orthopedic Surgery

## 2018-08-16 ENCOUNTER — Encounter (HOSPITAL_COMMUNITY)
Admission: RE | Disposition: A | Discharge status: Home / Self Care | Payer: Self-pay | Source: Home / Self Care | Attending: Orthopedic Surgery

## 2018-08-16 DIAGNOSIS — J449 Chronic obstructive pulmonary disease, unspecified: Secondary | ICD-10-CM | POA: Insufficient documentation

## 2018-08-16 DIAGNOSIS — X58XXXA Exposure to other specified factors, initial encounter: Secondary | ICD-10-CM | POA: Insufficient documentation

## 2018-08-16 DIAGNOSIS — Z7901 Long term (current) use of anticoagulants: Secondary | ICD-10-CM | POA: Insufficient documentation

## 2018-08-16 DIAGNOSIS — S76111A Strain of right quadriceps muscle, fascia and tendon, initial encounter: Secondary | ICD-10-CM | POA: Diagnosis not present

## 2018-08-16 DIAGNOSIS — M1990 Unspecified osteoarthritis, unspecified site: Secondary | ICD-10-CM | POA: Insufficient documentation

## 2018-08-16 DIAGNOSIS — I4891 Unspecified atrial fibrillation: Secondary | ICD-10-CM | POA: Diagnosis not present

## 2018-08-16 DIAGNOSIS — Z6839 Body mass index (BMI) 39.0-39.9, adult: Secondary | ICD-10-CM | POA: Insufficient documentation

## 2018-08-16 DIAGNOSIS — G4733 Obstructive sleep apnea (adult) (pediatric): Secondary | ICD-10-CM | POA: Insufficient documentation

## 2018-08-16 DIAGNOSIS — Z79899 Other long term (current) drug therapy: Secondary | ICD-10-CM | POA: Insufficient documentation

## 2018-08-16 HISTORY — PX: KNEE REPAIR EXTENSOR MECHANISM: SHX6613

## 2018-08-16 SURGERY — REPAIR, EXTENSOR MECHANISM, KNEE
Anesthesia: Spinal | Site: Knee | Laterality: Right

## 2018-08-16 MED ORDER — MENTHOL 3 MG MT LOZG: 1.0000 | LOZENGE | OROMUCOSAL | Status: DC | PRN

## 2018-08-16 MED ORDER — HYDROCODONE-ACETAMINOPHEN 5-325 MG PO TABS: 1.0000 | ORAL_TABLET | ORAL | Status: DC | PRN

## 2018-08-16 MED ORDER — METOCLOPRAMIDE HCL 5 MG PO TABS: 5.0000 mg | ORAL_TABLET | Freq: Three times a day (TID) | ORAL | Status: DC | PRN

## 2018-08-16 MED ORDER — OXYCODONE HCL 5 MG/5ML PO SOLN: 5.0000 mg | Freq: Once | ORAL | Status: DC | PRN

## 2018-08-16 MED ORDER — PROPOFOL 10 MG/ML IV BOLUS
INTRAVENOUS | Status: AC
  Filled 2018-08-16: qty 20

## 2018-08-16 MED ORDER — SCOPOLAMINE 1 MG/3DAYS TD PT72
MEDICATED_PATCH | TRANSDERMAL | Status: DC | PRN
  Administered 2018-08-16: 1 via TRANSDERMAL

## 2018-08-16 MED ORDER — ACETAMINOPHEN 500 MG PO TABS
500.0000 mg | ORAL_TABLET | Freq: Four times a day (QID) | ORAL | Status: DC
  Administered 2018-08-16 – 2018-08-17 (×2): 500 mg via ORAL
  Filled 2018-08-16 (×2): qty 1

## 2018-08-16 MED ORDER — FENTANYL CITRATE (PF) 100 MCG/2ML IJ SOLN
INTRAMUSCULAR | Status: AC
  Filled 2018-08-16: qty 2

## 2018-08-16 MED ORDER — FENTANYL CITRATE (PF) 100 MCG/2ML IJ SOLN
INTRAMUSCULAR | Status: DC | PRN
  Administered 2018-08-16: 12.5 ug via INTRAVENOUS

## 2018-08-16 MED ORDER — EPHEDRINE SULFATE-NACL 50-0.9 MG/10ML-% IV SOSY
PREFILLED_SYRINGE | INTRAVENOUS | Status: DC | PRN
  Administered 2018-08-16: 10 mg via INTRAVENOUS

## 2018-08-16 MED ORDER — ONDANSETRON HCL 4 MG/2ML IJ SOLN: 4.0000 mg | Freq: Once | INTRAMUSCULAR | Status: DC | PRN

## 2018-08-16 MED ORDER — MIDAZOLAM HCL 2 MG/2ML IJ SOLN
INTRAMUSCULAR | Status: AC
  Filled 2018-08-16: qty 2

## 2018-08-16 MED ORDER — SCOPOLAMINE 1 MG/3DAYS TD PT72
MEDICATED_PATCH | TRANSDERMAL | Status: AC
  Filled 2018-08-16: qty 1

## 2018-08-16 MED ORDER — PROPOFOL 500 MG/50ML IV EMUL
INTRAVENOUS | Status: DC | PRN
  Administered 2018-08-16: 25 ug/kg/min via INTRAVENOUS

## 2018-08-16 MED ORDER — ZOLPIDEM TARTRATE 5 MG PO TABS
5.0000 mg | ORAL_TABLET | Freq: Every day | ORAL | Status: DC
  Administered 2018-08-16: 23:00:00 5 mg via ORAL
  Filled 2018-08-16: qty 1

## 2018-08-16 MED ORDER — MIDAZOLAM HCL 5 MG/5ML IJ SOLN
INTRAMUSCULAR | Status: DC | PRN
  Administered 2018-08-16: 2 mg via INTRAVENOUS

## 2018-08-16 MED ORDER — BUPIVACAINE IN DEXTROSE 0.75-8.25 % IT SOLN
INTRATHECAL | Status: DC | PRN
  Administered 2018-08-16: 1.4 mL via INTRATHECAL

## 2018-08-16 MED ORDER — DEXAMETHASONE SODIUM PHOSPHATE 10 MG/ML IJ SOLN
10.0000 mg | Freq: Once | INTRAMUSCULAR | Status: AC
  Administered 2018-08-17: 10 mg via INTRAVENOUS
  Filled 2018-08-16: qty 1

## 2018-08-16 MED ORDER — POVIDONE-IODINE 10 % EX SWAB: 2.0000 "application " | Freq: Once | CUTANEOUS | Status: DC

## 2018-08-16 MED ORDER — CIPROFLOXACIN HCL 500 MG PO TABS
500.0000 mg | ORAL_TABLET | Freq: Two times a day (BID) | ORAL | Status: DC
  Administered 2018-08-16 – 2018-08-17 (×2): 500 mg via ORAL
  Filled 2018-08-16 (×2): qty 1

## 2018-08-16 MED ORDER — POLYETHYLENE GLYCOL 3350 17 G PO PACK: 17.0000 g | PACK | Freq: Every day | ORAL | Status: DC | PRN

## 2018-08-16 MED ORDER — METOCLOPRAMIDE HCL 5 MG/ML IJ SOLN: 5.0000 mg | Freq: Three times a day (TID) | INTRAMUSCULAR | Status: DC | PRN

## 2018-08-16 MED ORDER — SODIUM CHLORIDE 0.9 % IR SOLN
Status: DC | PRN
  Administered 2018-08-16: 1000 mL

## 2018-08-16 MED ORDER — METOPROLOL TARTRATE 25 MG PO TABS
25.0000 mg | ORAL_TABLET | Freq: Two times a day (BID) | ORAL | Status: DC
  Administered 2018-08-16 – 2018-08-17 (×2): 25 mg via ORAL
  Filled 2018-08-16 (×2): qty 1

## 2018-08-16 MED ORDER — SODIUM CHLORIDE 0.9 % IR SOLN
Status: DC | PRN
  Administered 2018-08-16: 4000 mL

## 2018-08-16 MED ORDER — CEFAZOLIN SODIUM-DEXTROSE 2-4 GM/100ML-% IV SOLN
2.0000 g | Freq: Four times a day (QID) | INTRAVENOUS | Status: AC
  Administered 2018-08-16 – 2018-08-17 (×2): 2 g via INTRAVENOUS
  Filled 2018-08-16 (×2): qty 100

## 2018-08-16 MED ORDER — DEXTROSE 5 % IV SOLN
3.0000 g | INTRAVENOUS | Status: AC
  Administered 2018-08-16: 16:00:00 3 g via INTRAVENOUS
  Filled 2018-08-16: qty 3000

## 2018-08-16 MED ORDER — OXYCODONE HCL 5 MG PO TABS: 5.0000 mg | ORAL_TABLET | Freq: Once | ORAL | Status: DC | PRN

## 2018-08-16 MED ORDER — PHENYLEPHRINE 40 MCG/ML (10ML) SYRINGE FOR IV PUSH (FOR BLOOD PRESSURE SUPPORT)
PREFILLED_SYRINGE | INTRAVENOUS | Status: AC
  Filled 2018-08-16: qty 10

## 2018-08-16 MED ORDER — BISACODYL 10 MG RE SUPP: 10.0000 mg | Freq: Every day | RECTAL | Status: DC | PRN

## 2018-08-16 MED ORDER — HYDROCHLOROTHIAZIDE 12.5 MG PO CAPS
12.5000 mg | ORAL_CAPSULE | Freq: Two times a day (BID) | ORAL | Status: DC
  Administered 2018-08-17: 10:00:00 12.5 mg via ORAL
  Filled 2018-08-16: qty 1

## 2018-08-16 MED ORDER — ACETAMINOPHEN 10 MG/ML IV SOLN
1000.0000 mg | Freq: Four times a day (QID) | INTRAVENOUS | Status: DC
  Administered 2018-08-16: 1000 mg via INTRAVENOUS
  Filled 2018-08-16: qty 100

## 2018-08-16 MED ORDER — SODIUM CHLORIDE 0.9 % IV SOLN
INTRAVENOUS | Status: DC
  Administered 2018-08-16: via INTRAVENOUS

## 2018-08-16 MED ORDER — METHOCARBAMOL 500 MG PO TABS: 500.0000 mg | ORAL_TABLET | Freq: Four times a day (QID) | ORAL | Status: DC | PRN

## 2018-08-16 MED ORDER — DOCUSATE SODIUM 100 MG PO CAPS
100.0000 mg | ORAL_CAPSULE | Freq: Two times a day (BID) | ORAL | Status: DC
  Administered 2018-08-16 – 2018-08-17 (×2): 100 mg via ORAL
  Filled 2018-08-16 (×2): qty 1

## 2018-08-16 MED ORDER — PHENYLEPHRINE 40 MCG/ML (10ML) SYRINGE FOR IV PUSH (FOR BLOOD PRESSURE SUPPORT)
PREFILLED_SYRINGE | INTRAVENOUS | Status: DC | PRN
  Administered 2018-08-16: 120 ug via INTRAVENOUS
  Administered 2018-08-16 (×7): 80 ug via INTRAVENOUS

## 2018-08-16 MED ORDER — FLEET ENEMA 7-19 GM/118ML RE ENEM: 1.0000 | ENEMA | Freq: Once | RECTAL | Status: DC | PRN

## 2018-08-16 MED ORDER — ONDANSETRON HCL 4 MG/2ML IJ SOLN: 4.0000 mg | Freq: Four times a day (QID) | INTRAMUSCULAR | Status: DC | PRN

## 2018-08-16 MED ORDER — ONDANSETRON HCL 4 MG/2ML IJ SOLN
INTRAMUSCULAR | Status: DC | PRN
  Administered 2018-08-16: 4 mg via INTRAVENOUS

## 2018-08-16 MED ORDER — ONDANSETRON HCL 4 MG PO TABS: 4.0000 mg | ORAL_TABLET | Freq: Four times a day (QID) | ORAL | Status: DC | PRN

## 2018-08-16 MED ORDER — FLECAINIDE ACETATE 50 MG PO TABS
75.0000 mg | ORAL_TABLET | Freq: Two times a day (BID) | ORAL | Status: DC
  Administered 2018-08-16 – 2018-08-17 (×2): 75 mg via ORAL
  Filled 2018-08-16 (×2): qty 2

## 2018-08-16 MED ORDER — METHOCARBAMOL 500 MG IVPB - SIMPLE MED
500.0000 mg | Freq: Four times a day (QID) | INTRAVENOUS | Status: DC | PRN
  Filled 2018-08-16: qty 50

## 2018-08-16 MED ORDER — PROPOFOL 10 MG/ML IV BOLUS
INTRAVENOUS | Status: DC | PRN
  Administered 2018-08-16 (×2): 20 mg via INTRAVENOUS

## 2018-08-16 MED ORDER — PHENYLEPHRINE 40 MCG/ML (10ML) SYRINGE FOR IV PUSH (FOR BLOOD PRESSURE SUPPORT)
PREFILLED_SYRINGE | INTRAVENOUS | Status: AC
  Filled 2018-08-16: qty 20

## 2018-08-16 MED ORDER — ASPIRIN EC 325 MG PO TBEC
325.0000 mg | DELAYED_RELEASE_TABLET | Freq: Every day | ORAL | Status: DC
  Administered 2018-08-17: 325 mg via ORAL
  Filled 2018-08-16: qty 1

## 2018-08-16 MED ORDER — DEXTROSE 5 % IV SOLN: INTRAVENOUS | Status: DC | PRN

## 2018-08-16 MED ORDER — EPHEDRINE 5 MG/ML INJ
INTRAVENOUS | Status: AC
  Filled 2018-08-16: qty 10

## 2018-08-16 MED ORDER — TRAMADOL HCL 50 MG PO TABS: 50.0000 mg | ORAL_TABLET | Freq: Four times a day (QID) | ORAL | Status: DC | PRN

## 2018-08-16 MED ORDER — LACTATED RINGERS IV SOLN
INTRAVENOUS | Status: DC
  Administered 2018-08-16 (×2): via INTRAVENOUS

## 2018-08-16 MED ORDER — DEXAMETHASONE SODIUM PHOSPHATE 10 MG/ML IJ SOLN
8.0000 mg | Freq: Once | INTRAMUSCULAR | Status: AC
  Administered 2018-08-16: 8 mg via INTRAVENOUS

## 2018-08-16 MED ORDER — CHLORHEXIDINE GLUCONATE 4 % EX LIQD: 60.0000 mL | Freq: Once | CUTANEOUS | Status: DC

## 2018-08-16 MED ORDER — DIPHENHYDRAMINE HCL 12.5 MG/5ML PO ELIX: 12.5000 mg | ORAL_SOLUTION | ORAL | Status: DC | PRN

## 2018-08-16 MED ORDER — FENTANYL CITRATE (PF) 100 MCG/2ML IJ SOLN: 25.0000 ug | INTRAMUSCULAR | Status: DC | PRN

## 2018-08-16 MED ORDER — SODIUM CHLORIDE 0.9 % IV SOLN
INTRAVENOUS | Status: AC
  Filled 2018-08-16: qty 2

## 2018-08-16 MED ORDER — PHENOL 1.4 % MT LIQD: 1.0000 | OROMUCOSAL | Status: DC | PRN

## 2018-08-16 SURGICAL SUPPLY — 45 items
BANDAGE ACE 6X5 VEL STRL LF (GAUZE/BANDAGES/DRESSINGS) ×3 IMPLANT
BIT DRILL 2.4X128 (BIT) ×1 IMPLANT
BIT DRILL 2.4X128MM (BIT)
COVER SURGICAL LIGHT HANDLE (MISCELLANEOUS) ×3 IMPLANT
COVER WAND RF STERILE (DRAPES) ×1 IMPLANT
CUFF TOURN SGL QUICK 34 (TOURNIQUET CUFF) ×3
CUFF TRNQT CYL 34X4.125X (TOURNIQUET CUFF) ×1 IMPLANT
DRAPE U-SHAPE 47X51 STRL (DRAPES) ×3 IMPLANT
DRSG ADAPTIC 3X8 NADH LF (GAUZE/BANDAGES/DRESSINGS) ×3 IMPLANT
DRSG PAD ABDOMINAL 8X10 ST (GAUZE/BANDAGES/DRESSINGS) ×3 IMPLANT
DURAPREP 26ML APPLICATOR (WOUND CARE) ×3 IMPLANT
ELECT REM PT RETURN 15FT ADLT (MISCELLANEOUS) ×3 IMPLANT
EVACUATOR 1/8 PVC DRAIN (DRAIN) ×3 IMPLANT
GAUZE SPONGE 4X4 12PLY STRL (GAUZE/BANDAGES/DRESSINGS) ×3 IMPLANT
GLOVE BIO SURGEON STRL SZ8 (GLOVE) ×3 IMPLANT
GLOVE BIOGEL PI IND STRL 8 (GLOVE) ×1 IMPLANT
GLOVE BIOGEL PI INDICATOR 8 (GLOVE) ×2
GOWN STRL REUS W/TWL LRG LVL3 (GOWN DISPOSABLE) ×6 IMPLANT
HANDPIECE INTERPULSE COAX TIP (DISPOSABLE) ×3
IMMOBILIZER KNEE 20 (SOFTGOODS) ×3
IMMOBILIZER KNEE 20 THIGH 36 (SOFTGOODS) ×1 IMPLANT
KIT TURNOVER KIT A (KITS) IMPLANT
MANIFOLD NEPTUNE II (INSTRUMENTS) ×3 IMPLANT
NDL MA TROC 1/2 (NEEDLE) IMPLANT
NDL MAYO CATGUT SZ4 TPR NDL (NEEDLE) IMPLANT
NEEDLE MA TROC 1/2 (NEEDLE) IMPLANT
NEEDLE MAYO CATGUT SZ4 (NEEDLE) IMPLANT
PACK TOTAL KNEE CUSTOM (KITS) ×3 IMPLANT
PADDING CAST COTTON 6X4 STRL (CAST SUPPLIES) ×4 IMPLANT
PASSER SUT SWANSON 36MM LOOP (INSTRUMENTS) ×1 IMPLANT
PROTECTOR NERVE ULNAR (MISCELLANEOUS) ×3 IMPLANT
SET HNDPC FAN SPRY TIP SCT (DISPOSABLE) ×1 IMPLANT
STAPLER VISISTAT 35W (STAPLE) IMPLANT
SUT ETHIBOND NAB CT1 #1 30IN (SUTURE) ×10 IMPLANT
SUT ETHILON 3 0 PS 1 (SUTURE) ×2 IMPLANT
SUT FIBERWIRE #2 38 T-5 BLUE (SUTURE)
SUT MNCRL AB 4-0 PS2 18 (SUTURE) ×1 IMPLANT
SUT STRATAFIX 0 PDS 27 VIOLET (SUTURE) ×3
SUT VIC AB 1 CT1 27 (SUTURE) ×6
SUT VIC AB 1 CT1 27XBRD ANTBC (SUTURE) ×2 IMPLANT
SUT VIC AB 2-0 CT1 27 (SUTURE) ×12
SUT VIC AB 2-0 CT1 TAPERPNT 27 (SUTURE) ×2 IMPLANT
SUTURE FIBERWR #2 38 T-5 BLUE (SUTURE) ×2 IMPLANT
SUTURE STRATFX 0 PDS 27 VIOLET (SUTURE) ×1 IMPLANT
WRAP KNEE MAXI GEL POST OP (GAUZE/BANDAGES/DRESSINGS) ×3 IMPLANT

## 2018-08-16 NOTE — Anesthesia Postprocedure Evaluation (Signed)
Anesthesia Post Note  Patient: Brent Dougherty  Procedure(s) Performed: KNEE REPAIR EXTENSOR MECHANISM (Right Knee)     Patient location during evaluation: PACU Anesthesia Type: Spinal Level of consciousness: oriented and awake and alert Pain management: pain level controlled Vital Signs Assessment: post-procedure vital signs reviewed and stable Respiratory status: spontaneous breathing, respiratory function stable and nonlabored ventilation Cardiovascular status: blood pressure returned to baseline and stable Postop Assessment: no headache, no backache, no apparent nausea or vomiting and spinal receding Anesthetic complications: no    Last Vitals:  Vitals:   08/16/18 1815 08/16/18 1826  BP: 135/84 (!) 141/84  Pulse: 77 75  Resp: 17 15  Temp: (!) 36.4 C (!) 36.4 C  SpO2: 100% 100%    Last Pain:  Vitals:   08/16/18 1838  TempSrc:   PainSc: 0-No pain                 Lidia Collum

## 2018-08-16 NOTE — Discharge Instructions (Signed)
Dr. Ollen GrossFrank Aluisio [facility] Specialist [facility] [address] [place], [place] [address] [phone]  POSTOPERATIVE DIRECTIONS    Keep right leg in knee immobilizer at all times until your next follow-up appointment. No range of motion of the right knee until instructed to do so.  HOME CARE INSTRUCTIONS   Remove items at home which could result in a fall. This includes throw rugs or furniture in walking pathways.   ICE to the affected knee every three hours for 30 minutes at a time and then as needed for pain and swelling.  Continue to use ice on the knee for pain and swelling from surgery. You may notice swelling that will progress down to the foot and ankle.  This is normal after surgery.  Elevate the leg when you are not up walking on it.    Continue to use the breathing machine which will help keep your temperature down.  It is common for your temperature to cycle up and down following surgery, especially at night when you are not up moving around and exerting yourself.  The breathing machine keeps your lungs expanded and your temperature down.  Do not place pillow under knee, focus on keeping the knee straight while resting  DIET You may resume your previous home diet once your are discharged from the hospital.  DRESSING / WOUND CARE / SHOWERING You may change your dressing 3-5 days after surgery.  Then change the dressing every day with sterile gauze.  Please use good hand washing techniques before changing the dressing.  Do not use any lotions or creams on the incision until instructed by your surgeon. You may start showering once you are discharged home but do not submerge the incision under water. Just pat the incision dry and apply a dry gauze dressing on daily. Change the surgical dressing daily and reapply a dry dressing each time.  ACTIVITY Walk with your walker as instructed. Use walker as long as suggested by your caregivers. Avoid periods of  inactivity such as sitting longer than an hour when not asleep. This helps prevent blood clots.  You may resume a sexual relationship in one month or when given the OK by your doctor.  You may return to work once you are cleared by your doctor.  Do not drive a car for 6 weeks or until released by you surgeon.  Do not drive while taking narcotics.  WEIGHT BEARING Weight bearing as tolerated with assist device (walker, cane, etc) as directed, use it as long as suggested by your surgeon or therapist, typically at least 4-6 weeks.  POSTOPERATIVE CONSTIPATION PROTOCOL Constipation - defined medically as fewer than three stools per week and severe constipation as less than one stool per week.  One of the most common issues patients have following surgery is constipation.  Even if you have a regular bowel pattern at home, your normal regimen is likely to be disrupted due to multiple reasons following surgery.  Combination of anesthesia, postoperative narcotics, change in appetite and fluid intake all can affect your bowels.  In order to avoid complications following surgery, here are some recommendations in order to help you during your recovery period.  Colace (docusate) - Pick up an over-the-counter form of Colace or another stool softener and take twice a day as long as you are requiring postoperative pain medications.  Take with a full glass of water daily.  If you experience loose stools or diarrhea, hold the colace until you  stool forms back up.  If your symptoms do not get better within 1 week or if they get worse, check with your doctor.  Dulcolax (bisacodyl) - Pick up over-the-counter and take as directed by the product packaging as needed to assist with the movement of your bowels.  Take with a full glass of water.  Use this product as needed if not relieved by Colace only.   MiraLax (polyethylene glycol) - Pick up over-the-counter to have on hand.  MiraLax is a solution that will increase the  amount of water in your bowels to assist with bowel movements.  Take as directed and can mix with a glass of water, juice, soda, coffee, or tea.  Take if you go more than two days without a movement. Do not use MiraLax more than once per day. Call your doctor if you are still constipated or irregular after using this medication for 7 days in a row.  If you continue to have problems with postoperative constipation, please contact the office for further assistance and recommendations.  If you experience "the worst abdominal pain ever" or develop nausea or vomiting, please contact the office immediatly for further recommendations for treatment.  ITCHING If you experience itching with your medications, try taking only a single pain pill, or even half a pain pill at a time.  You can also use Benadryl over the counter for itching or also to help with sleep.   TED HOSE STOCKINGS Wear the elastic stockings on both legs for three weeks following surgery during the day but you may remove then at night for sleeping.  MEDICATIONS See your medication summary on the After Visit Summary that the nursing staff will review with you prior to discharge.  You may have some home medications which will be placed on hold until you complete the course of blood thinner medication.  It is important for you to complete the blood thinner medication as prescribed by your surgeon.  Continue your approved medications as instructed at time of discharge.  PRECAUTIONS If you experience chest pain or shortness of breath - call 911 immediately for transfer to the hospital emergency department.  If you develop a fever greater that 101 F, purulent drainage from wound, increased redness or drainage from wound, foul odor from the wound/dressing, or calf pain - CONTACT YOUR SURGEON.                                                   FOLLOW-UP APPOINTMENTS Make sure you keep all of your appointments after your operation with your surgeon  and caregivers. You should call the office at the above phone number and make an appointment for approximately two weeks after the date of your surgery or on the date instructed by your surgeon outlined in the "After Visit Summary".  IF YOU ARE TRANSFERRED TO A SKILLED REHAB FACILITY If the patient is transferred to a skilled rehab facility following release from the hospital, a list of the current medications will be sent to the facility for the patient to continue.  When discharged from the skilled rehab facility, please have the facility set up the patient's [place] prior to being released. Also, the skilled facility will be responsible for providing the patient with their medications at time of release from the facility to include their pain medication,  the muscle relaxants, and their blood thinner medication. If the patient is still at the rehab facility at time of the two week follow up appointment, the skilled rehab facility will also need to assist the patient in arranging follow up appointment in our office and any transportation needs.  MAKE SURE YOU:   Understand these instructions.   Get help right away if you are not doing well or get worse.    Pick up stool softner and laxative for home use following surgery while on pain medications. Do not submerge incision under water. Please use good hand washing techniques while changing dressing each day. May shower starting three days after surgery. Please use a clean towel to pat the incision dry following showers. Continue to use ice for pain and swelling after surgery. Do not use any lotions or creams on the incision until instructed by your surgeon.

## 2018-08-16 NOTE — Anesthesia Procedure Notes (Addendum)
Procedure Name: MAC Date/Time: 08/16/2018 3:23 PM Performed by: West Pugh, CRNA Pre-anesthesia Checklist: Patient identified, Emergency Drugs available, Suction available, Patient being monitored and Timeout performed Patient Re-evaluated:Patient Re-evaluated prior to induction Oxygen Delivery Method: Simple face mask Preoxygenation: Pre-oxygenation with 100% oxygen Induction Type: IV induction Placement Confirmation: positive ETCO2 Dental Injury: Teeth and Oropharynx as per pre-operative assessment

## 2018-08-16 NOTE — H&P (Signed)
Brent Dougherty is an 68 y.o. male.   Chief Complaint: Right knee pain HPI: 68 yo male s/p Irrigation and debridement and polyethylene exchange on 07/19/18. He was doing well until a rigorous physical therapy session when he developed increased pain and subsequent inability to extend his knee. He presented to the office with a large effusion which was aspirated to reveal blood. 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 He presents for arthrotomy and repair of his right knee extensor mechanism  Past Medical History:  Diagnosis Date  . Arthritis    BOTH KNEES  . Asthma    uncomplicated  . Atrial fibrillation (HCC) 01/2017  . Bronchitis    LAST TIME WAS JAN 2014  . Chronic insomnia 11/14/2015  . Chronic left shoulder pain 01/19/2016  . Dysrhythmia   . Edema   . Elevated blood pressure reading   . History of blood transfusion 07/27/2017  . History of kidney stones   . Morbid obesity (HCC) 11/14/2015  . Obstructive chronic bronchitis without exacerbation (HCC) 03/14/2008  . OSA on CPAP   . Osteoarthritis of knee 11/14/2015  . PONV (postoperative nausea and vomiting)     Past Surgical History:  Procedure Laterality Date  . BILATERAL KNEE ARTHROSCOPY    . CARDIOVERSION N/A 03/08/2017   Procedure: CARDIOVERSION;  Surgeon: Wendall StadeNishan, Peter C, MD;  [facility];  Service: Cardiovascular;  Laterality: N/A;  . I&D KNEE WITH POLY EXCHANGE Right 07/19/2018   Procedure: IRRIGATION AND DEBRIDEMENT KNEE WITH POLY EXCHANGE;  Surgeon: Ollen GrossAluisio, Dwayne Begay, MD;  Location: [facility] ORS;  Service: Orthopedics;  Laterality: Right;  . PARTIAL AMPUTATION LEFT THUMB AND TENDON REPAIR    . TONSILLECTOMY AND ADENOIDECTOMY    . TOTAL KNEE ARTHROPLASTY Right 06/19/2012   Procedure: RIGHT TOTAL KNEE ARTHROPLASTY;  Surgeon: Loanne DrillingFrank V Jahaziel Francois, MD;  Location: [facility] OR[facility];  Service: Orthopedics;  Laterality: Right;  . TOTAL KNEE REVISION Right  07/27/2017   Procedure: RIGHT KNEE POLYETHYLENE REVISION;  Surgeon: Ollen GrossAluisio, Avraj Lindroth, MD;  Location: [facility];  Service: Orthopedics;  Laterality: Right;  Adductor Block    Family History  Problem Relation Age of Onset  . Heart disease Father   . Diabetes Father   . Cancer - Other Father        SKIN  . Cancer Mother        BRAIN  . Heart disease Paternal Grandfather   . Diabetes Paternal Grandfather   . Prostate cancer Paternal Grandfather   . Cancer - Other Paternal Grandfather        SKIN  . Colon cancer Neg Hx   . Colon polyps Neg Hx   . Esophageal cancer Neg Hx   . Rectal cancer Neg Hx   . Stomach cancer Neg Hx    Social History:  reports that he has never smoked. He has never used smokeless tobacco. He reports that he does not drink alcohol or use drugs.  Allergies:  Allergies  Allergen Reactions  . Morphine And Related Other (See Comments)    MILD NAUSEA    Medications Prior to Admission  Medication Sig Dispense Refill  . ciprofloxacin (CIPRO) 500 MG tablet Take 500 mg by mouth 2 (two) times a day.    . diphenhydramine-acetaminophen (TYLENOL PM) 25-500 MG TABS tablet Take 2 tablets by mouth at bedtime as needed (sleep).     . flecainide (TAMBOCOR) 50 MG tablet TAKE ONE AND ONE-HALF TABLETS  TWICE A DAY (Patient taking differently: Take 75 mg by mouth 2 (two) times daily. ) 270 tablet 0  . hydrochlorothiazide (MICROZIDE) 12.5 MG capsule TAKE 1 CAPSULE TWICE A DAY (Patient taking differently: Take 12.5 mg by mouth 2 (two) times daily. ) 180 capsule 0  . metoprolol tartrate (LOPRESSOR) 25 MG tablet TAKE 1 TABLET TWICE A DAY (Patient taking differently: Take 25 mg by mouth 2 (two) times daily. ) 180 tablet 1  . XARELTO 20 MG TABS tablet TAKE 1 TABLET DAILY WITH SUPPER (Patient taking differently: Take 20 mg by mouth daily with supper. ) 90 tablet 2  . zolpidem (AMBIEN) 10 MG tablet 1/2 or 1 tab at bedtime for sleep as needed (Patient taking differently: Take 10 mg by mouth at  bedtime. ) 30 tablet 5  . methocarbamol (ROBAXIN) 500 MG tablet Take 1 tablet (500 mg total) by mouth every 6 (six) hours as needed for muscle spasms. 40 tablet 0  . oxyCODONE (OXY IR/ROXICODONE) 5 MG immediate release tablet Take 1-2 tablets (5-10 mg total) by mouth every 6 (six) hours as needed for moderate pain (pain score 4-6). 56 tablet 0  . traMADol (ULTRAM) 50 MG tablet Take 1-2 tablets (50-100 mg total) by mouth every 6 (six) hours as needed for moderate pain (use oxycodone 1st). 40 tablet 0    Results for orders placed or performed during the hospital encounter of 08/15/18 (from the past 48 hour(s))  Basic metabolic panel     Status: None   Collection Time: 08/15/18  9:45 AM  Result Value Ref Range   Sodium 136 135 - 145 mmol/L   Potassium 4.3 3.5 - 5.1 mmol/L   Chloride 99 98 - 111 mmol/L   CO2 29 22 - 32 mmol/L   Glucose, Bld 92 70 - 99 mg/dL   BUN 16 8 - 23 mg/dL   Creatinine, Ser 0.160.98 0.61 - 1.24 mg/dL   Calcium 9.2 8.9 - 01.010.3 mg/dL   GFR calc non Af Amer >60 >60 mL/min   GFR calc Af Amer >60 >60 mL/min   Anion gap 8 5 - 15    Comment: Performed at [facility], [address] [place], [place] [address]  CBC     Status: None   Collection Time: 08/15/18  9:45 AM  Result Value Ref Range   WBC 7.0 4.0 - 10.5 K/uL   RBC 4.72 4.22 - 5.81 MIL/uL   Hemoglobin 14.0 13.0 - 17.0 g/dL   HCT 57.343.0 22.039.0 - 25.452.0 %   MCV 91.1 80.0 - 100.0 fL   MCH 29.7 26.0 - 34.0 pg   MCHC 32.6 30.0 - 36.0 g/dL   RDW 27.013.4 62.311.5 - 76.215.5 %   Platelets 316 150 - 400 K/uL   nRBC 0.0 0.0 - 0.2 %    Comment: Performed at [facility], [address] [place], [place] [address]   No results found.  ROS  Blood pressure 132/89, pulse 64, temperature 97.8 F (36.6 C), temperature source Oral, resp. rate 13, height 6\' 3"  (1.905 m), weight (!) 142.1 kg, SpO2 99 %. Physical Exam Physical Examination: General appearance - alert, well appearing, and in no distress Mental  status - alert, oriented to person, place, and time Chest - clear to auscultation, no wheezes, rales or rhonchi, symmetric air entry Heart - normal rate, regular rhythm, normal S1, S2, no murmurs, rubs, clicks or gallops Abdomen - soft, nontender, nondistended, no masses or organomegaly Neurological - alert,  oriented, normal speech, no focal findings or movement disorder noted Right knee- Moderate effusion; no warmth or erythema; flex to 100 with 30 degree extensor lag and palpable medial defect  Assessment/Plan Right knee extensor mechanism disruption- Plan right knee extensor mechanism repair. Discussed in detail with the patient who elects to proceed  Gaynelle Arabian, MD 08/16/2018, 2:07 PM

## 2018-08-16 NOTE — Anesthesia Procedure Notes (Addendum)
Spinal  Patient location during procedure: OR Start time: 08/16/2018 3:23 PM End time: 08/16/2018 3:36 PM Reason for block: at surgeon's request Staffing Resident/CRNA: Walker, Karen L, CRNA Performed: resident/CRNA  Preanesthetic Checklist Completed: patient identified, site marked, surgical consent, pre-op evaluation, timeout performed, IV checked, risks and benefits discussed and monitors and equipment checked Spinal Block Patient position: sitting Prep: DuraPrep Patient monitoring: heart rate, continuous pulse ox and blood pressure Approach: midline Location: L3-4 Injection technique: single-shot Needle Needle type: Pencan  Needle gauge: 24 G Needle length: 9 cm Assessment Sensory level: T6 Additional Notes Expiration of kit checked and confirmed. Patient tolerated procedure well,without complications, with noted clear CSF. Loss of motor and sensory on exam post injection.     

## 2018-08-16 NOTE — Brief Op Note (Signed)
08/16/2018  4:42 PM  PATIENT:  Brent Dougherty  67 y.o. male  PRE-OPERATIVE DIAGNOSIS:  Extensor mechanism tear right knee  POST-OPERATIVE DIAGNOSIS:  Extensor mechanism tear right knee  PROCEDURE:  Procedure(s) with comments: KNEE REPAIR EXTENSOR MECHANISM (Right) - 17min  SURGEON:  Surgeon(s) and Role:    Gaynelle Arabian, MD - Primary  PHYSICIAN ASSISTANT:   ASSISTANTS: Griffith Citron, PA-C  ANESTHESIA:   spinal  EBL:  25 mL   BLOOD ADMINISTERED:none  DRAINS: (Medial) Hemovact drain(s) in the right knee with  Suction Open   LOCAL MEDICATIONS USED:  NONE  COUNTS:  YES  TOURNIQUET:   Total Tourniquet Time Documented: Thigh (Right) - 35 minutes Total: Thigh (Right) - 35 minutes   DICTATION: .Other Dictation: Dictation Number [id number]  PLAN OF CARE: Admit for overnight observation  PATIENT DISPOSITION:  PACU - hemodynamically stable.

## 2018-08-16 NOTE — Transfer of Care (Signed)
Immediate Anesthesia Transfer of Care Note  Patient: Brent Dougherty  Procedure(s) Performed: KNEE REPAIR EXTENSOR MECHANISM (Right Knee)  Patient Location: PACU  Anesthesia Type:MAC and Spinal  Level of Consciousness: awake, alert , oriented and patient cooperative  Airway & Oxygen Therapy: Patient Spontanous Breathing and Patient connected to face mask oxygen  Post-op Assessment: Report given to RN and Post -op Vital signs reviewed and stable  Post vital signs: Reviewed and stable  Last Vitals:  Vitals Value Taken Time  BP 141/99 08/16/18 1730  Temp 36.4 C 08/16/18 1730  Pulse 117 08/16/18 1730  Resp 16 08/16/18 1730  SpO2 100 % 08/16/18 1730    Last Pain:  Vitals:   08/16/18 1730  TempSrc:   PainSc: 0-No pain         Complications: No apparent anesthesia complications

## 2018-08-16 NOTE — Anesthesia Preprocedure Evaluation (Signed)
Anesthesia Evaluation  Patient identified by MRN, date of birth, ID band Patient awake    Reviewed: Allergy & Precautions, NPO status , Patient's Chart, lab work & pertinent test results  History of Anesthesia Complications (+) PONV and history of anesthetic complications  Airway Mallampati: III  TM Distance: >3 FB Neck ROM: Full    Dental no notable dental hx.    Pulmonary asthma , sleep apnea and Continuous Positive Airway Pressure Ventilation , COPD,    Pulmonary exam normal        Cardiovascular Normal cardiovascular exam+ dysrhythmias (on Xarelto, last dose 07/15/18) Atrial Fibrillation      Neuro/Psych negative neurological ROS     GI/Hepatic negative GI ROS, Neg liver ROS,   Endo/Other  negative endocrine ROS  Renal/GU negative Renal ROS     Musculoskeletal  (+) Arthritis , Right knee PJI   Abdominal   Peds  Hematology negative hematology ROS (+)   Anesthesia Other Findings Denies interval change since previous anesthetic.  Reproductive/Obstetrics                             Anesthesia Physical  Anesthesia Plan  ASA: III  Anesthesia Plan: Spinal   Post-op Pain Management:    Induction:   PONV Risk Score and Plan: 3 and Treatment may vary due to age or medical condition, Ondansetron, Midazolam, Scopolamine patch - Pre-op and Dexamethasone  Airway Management Planned: Natural Airway, Nasal Cannula and Simple Face Mask  Additional Equipment: None  Intra-op Plan:   Post-operative Plan:   Informed Consent: I have reviewed the patients History and Physical, chart, labs and discussed the procedure including the risks, benefits and alternatives for the proposed anesthesia with the patient or authorized representative who has indicated his/her understanding and acceptance.       Plan Discussed with: Anesthesiologist and CRNA  Anesthesia Plan Comments:          Anesthesia Quick Evaluation

## 2018-08-17 ENCOUNTER — Encounter (HOSPITAL_COMMUNITY): Payer: Self-pay | Admitting: Orthopedic Surgery

## 2018-08-17 DIAGNOSIS — S76111A Strain of right quadriceps muscle, fascia and tendon, initial encounter: Secondary | ICD-10-CM | POA: Diagnosis not present

## 2018-08-17 LAB — BASIC METABOLIC PANEL
Anion gap: 9 (ref 5–15)
BUN: 11 mg/dL (ref 8–23)
CO2: 25 mmol/L (ref 22–32)
Calcium: 8.7 mg/dL — ABNORMAL LOW (ref 8.9–10.3)
Chloride: 100 mmol/L (ref 98–111)
Creatinine, Ser: 0.78 mg/dL (ref 0.61–1.24)
GFR calc Af Amer: >60 mL/min (ref >60)
GFR calc non Af Amer: >60 mL/min (ref >60)
Glucose, Bld: 137 mg/dL — ABNORMAL HIGH (ref 70–99)
Potassium: 3.8 mmol/L (ref 3.5–5.1)
Sodium: 134 mmol/L — ABNORMAL LOW (ref 135–145)

## 2018-08-17 LAB — CBC
HCT: 39.1 % (ref 39.0–52.0)
Hemoglobin: 13.1 g/dL (ref 13.0–17.0)
MCH: 29.9 pg (ref 26.0–34.0)
MCHC: 33.5 g/dL (ref 30.0–36.0)
MCV: 89.3 fL (ref 80.0–100.0)
Platelets: 275 10*3/uL (ref 150–400)
RBC: 4.38 MIL/uL (ref 4.22–5.81)
RDW: 13.1 % (ref 11.5–15.5)
WBC: 6.5 10*3/uL (ref 4.0–10.5)
nRBC: 0 % (ref 0.0–0.2)

## 2018-08-17 MED ORDER — ASPIRIN 325 MG PO TBEC: 325.0000 mg | DELAYED_RELEASE_TABLET | Freq: Every day | ORAL | 0 refills | Status: DC

## 2018-08-17 NOTE — Plan of Care (Signed)
  Problem: Education: Goal: Knowledge of General Education information will improve Description: Including pain rating scale, medication(s)/side effects and non-pharmacologic comfort measures Outcome: Adequate for Discharge   Problem: Health Behavior/Discharge Planning: Goal: Ability to manage health-related needs will improve Outcome: Adequate for Discharge   Problem: Clinical Measurements: Goal: Ability to maintain clinical measurements within normal limits will improve Outcome: Adequate for Discharge Goal: Will remain free from infection Outcome: Adequate for Discharge Goal: Diagnostic test results will improve Outcome: Adequate for Discharge Goal: Respiratory complications will improve Outcome: Adequate for Discharge Goal: Cardiovascular complication will be avoided Outcome: Adequate for Discharge   Problem: Activity: Goal: Risk for activity intolerance will decrease Outcome: Adequate for Discharge   Problem: Nutrition: Goal: Adequate nutrition will be maintained Outcome: Adequate for Discharge   Problem: Coping: Goal: Level of anxiety will decrease Outcome: Adequate for Discharge   Problem: Elimination: Goal: Will not experience complications related to bowel motility Outcome: Adequate for Discharge Goal: Will not experience complications related to urinary retention Outcome: Adequate for Discharge   Problem: Pain Managment: Goal: General experience of comfort will improve Outcome: Adequate for Discharge   Problem: Safety: Goal: Ability to remain free from injury will improve Outcome: Adequate for Discharge   Problem: Skin Integrity: Goal: Risk for impaired skin integrity will decrease Outcome: Adequate for Discharge  Home with wife.  Discharge teaching done. Written information given

## 2018-08-17 NOTE — Progress Notes (Signed)
   Subjective: 1 Day Post-Op Procedure(s) (LRB): KNEE REPAIR EXTENSOR MECHANISM (Right) Patient reports pain as mild.   Patient seen in rounds with Dr. Wynelle Link. Patient is well, and has had no acute complaints or problems other than discomfort in the right knee. No SOB or chest pain.  Plan is to go Home after hospital stay.  Objective: Vital signs in last 24 hours: Temp:  [97.5 F (36.4 C)-98.2 F (36.8 C)] 98.1 F (36.7 C) (07/02 0524) Pulse Rate:  [64-117] 78 (07/02 0524) Resp:  [13-20] 18 (07/02 0524) BP: (121-141)/(76-99) 132/97 (07/02 0524) SpO2:  [97 %-100 %] 98 % (07/02 0524) Weight:  [142.1 kg] 142.1 kg (07/01 1335)  Intake/Output from previous day:  Intake/Output Summary (Last 24 hours) at 08/17/2018 0656 Last data filed at 08/17/2018 0645 Gross per 24 hour  Intake 2908.06 ml  Output 1955 ml  Net 953.06 ml    Intake/Output this shift: Total I/O In: 1058.1 [I.V.:483.1; Other:375; IV Piggyback:200] Out: 1660 [Urine:1600; Drains:60]  Labs: Recent Labs    08/15/18 0945 08/17/18 0332  HGB 14.0 13.1   Recent Labs    08/15/18 0945 08/17/18 0332  WBC 7.0 6.5  RBC 4.72 4.38  HCT 43.0 39.1  PLT 316 275   Recent Labs    08/15/18 0945 08/17/18 0332  NA 136 134*  K 4.3 3.8  CL 99 100  CO2 29 25  BUN 16 11  CREATININE 0.98 0.78  GLUCOSE 92 137*  CALCIUM 9.2 8.7*    EXAM General - Patient is Alert and Oriented Extremity - Neurologically intact Intact pulses distally Dorsiflexion/Plantar flexion intact No cellulitis present Compartment soft Dressing/Incision - clean, dry, no drainage Motor Function - intact, moving foot and toes well on exam.   Past Medical History:  Diagnosis Date  . Arthritis    BOTH KNEES  . Asthma    uncomplicated  . Atrial fibrillation ([place]) 01/2017  . Bronchitis    LAST TIME WAS JAN 2014  . Chronic insomnia 11/14/2015  . Chronic left shoulder pain 01/19/2016  . Dysrhythmia   . Edema   . Elevated blood pressure reading    . History of blood transfusion 07/27/2017  . History of kidney stones   . Morbid obesity ([place]) 11/14/2015  . Obstructive chronic bronchitis without exacerbation ([place]) 03/14/2008  . OSA on CPAP   . Osteoarthritis of knee 11/14/2015  . PONV (postoperative nausea and vomiting)     Assessment/Plan: 1 Day Post-Op Procedure(s) (LRB): KNEE REPAIR EXTENSOR MECHANISM (Right) Active Problems:   Quadriceps tendon rupture, right, initial encounter  Estimated body mass index is 39.15 kg/m as calculated from the following:   Height as of this encounter: 6\' 3"  (1.905 m).   Weight as of this encounter: 142.1 kg. Advance diet Up with therapy D/C IV fluids when tolerating POs well  DVT Prophylaxis - Aspirin Weight-Bearing as tolerated with knee immobilizer  Plan for therapy today. Must wear knee immobilizer. No motion of the knee. Plan for DC home this afternoon. Follow up in office on Tuesday 7/7 for removal of drains.   Ardeen Jourdain, PA-C Orthopaedic Surgery 08/17/2018, 6:56 AM

## 2018-08-17 NOTE — Evaluation (Signed)
Physical Therapy Evaluation Patient Details Name: Brent Dougherty W Stirn MRN: [id number] DOB: 10/16/1950 Today's Date: 08/17/2018   History of Present Illness  Pt s/p R TKR extensor mechanism repair.  Clinical Impression  Pt admitted as above and presenting with functional mobility limitations 2* decreased strength R LE, no ROM R knee and post op pain.  Pt should progress to dc home with assist of family.    Follow Up Recommendations Follow surgeon's recommendation for DC plan and follow-up therapies    Equipment Recommendations  None recommended by PT    Recommendations for Other Services       Precautions / Restrictions Precautions Precautions: Knee;Fall Precaution Comments: NO ROM R knee Required Braces or Orthoses: Knee Immobilizer - Right Knee Immobilizer - Right: On at all times Restrictions Weight Bearing Restrictions: No RLE Weight Bearing: Weight bearing as tolerated      Mobility  Bed Mobility Overal bed mobility: Needs Assistance Bed Mobility: Supine to Sit;Sit to Supine     Supine to sit: Min assist Sit to supine: Min assist   General bed mobility comments: min assist to manage R LE  Transfers Overall transfer level: Needs assistance Equipment used: Rolling walker (2 wheeled) Transfers: Sit to/from Stand Sit to Stand: Min guard         General transfer comment: steady assist with cues for use of UEs to self assist  Ambulation/Gait Ambulation/Gait assistance: Min guard Gait Distance (Feet): 140 Feet Assistive device: Rolling walker (2 wheeled) Gait Pattern/deviations: Step-to pattern;Step-through pattern;Decreased step length - right;Decreased step length - left;Shuffle;Trunk flexed Gait velocity: decr   General Gait Details: cues for posture, position from RW and initial sequence  Stairs            Wheelchair Mobility    Modified Rankin (Stroke Patients Only)       Balance Overall balance assessment: Mild deficits observed, not formally  tested                                           Pertinent Vitals/Pain Pain Assessment: 0-10 Pain Score: 3  Pain Location: R knee Pain Descriptors / Indicators: Aching;Sore Pain Intervention(s): Limited activity within patient's tolerance;Monitored during session;Ice applied    Home Living Family/patient expects to be discharged to:: Private residence Living Arrangements: Spouse/significant other Available Help at Discharge: Family Type of Home: House Home Access: Level entry     Home Layout: One level Home Equipment: Environmental consultantWalker - 2 wheels;Cane - single point      Prior Function Level of Independence: Independent;Independent with assistive device(s)               Hand Dominance        Extremity/Trunk Assessment   Upper Extremity Assessment Upper Extremity Assessment: Overall WFL for tasks assessed    Lower Extremity Assessment Lower Extremity Assessment: RLE deficits/detail RLE Deficits / Details: KI in place RLE: Unable to fully assess due to immobilization    Cervical / Trunk Assessment Cervical / Trunk Assessment: Normal  Communication   Communication: No difficulties  Cognition Arousal/Alertness: Awake/alert Behavior During Therapy: WFL for tasks assessed/performed Overall Cognitive Status: Within Functional Limits for tasks assessed                                        General Comments  Exercises Total Joint Exercises Ankle Circles/Pumps: AROM;Both;20 reps;Supine   Assessment/Plan    PT Assessment Patient needs continued PT services  PT Problem List Decreased strength;Decreased range of motion;Decreased activity tolerance;Decreased mobility;Decreased knowledge of use of DME;Pain       PT Treatment Interventions DME instruction;Gait training;Stair training;Functional mobility training;Therapeutic activities;Therapeutic exercise;Patient/family education    PT Goals (Current goals can be found in the Care  Plan section)  Acute Rehab PT Goals Patient Stated Goal: Regain IND PT Goal Formulation: With patient Time For Goal Achievement: 08/03/18 Potential to Achieve Goals: Good    Frequency 7X/week   Barriers to discharge        Co-evaluation               AM-PAC PT "6 Clicks" Mobility  Outcome Measure Help needed turning from your back to your side while in a flat bed without using bedrails?: A Little Help needed moving from lying on your back to sitting on the side of a flat bed without using bedrails?: A Little Help needed moving to and from a bed to a chair (including a wheelchair)?: A Little Help needed standing up from a chair using your arms (e.g., wheelchair or bedside chair)?: A Little Help needed to walk in hospital room?: A Little Help needed climbing 3-5 steps with a railing? : A Little 6 Click Score: 18    End of Session Equipment Utilized During Treatment: Gait belt;Right knee immobilizer Activity Tolerance: Patient tolerated treatment well Patient left: in bed;with call bell/phone within reach Nurse Communication: Mobility status PT Visit Diagnosis: Difficulty in walking, not elsewhere classified (R26.2)    Time: 6811-5726 PT Time Calculation (min) (ACUTE ONLY): 35 min   Charges:   PT Evaluation $PT Eval Low Complexity: 1 Low PT Treatments $Gait Training: 8-22 mins        [place] Pager [phone] Office [phone]   Jeshurun [place] 08/17/2018, 12:42 PM

## 2018-08-17 NOTE — Op Note (Signed)
NAME: Brent Dougherty, Brent Dougherty. MEDICAL RECORD JY:[id number] ACCOUNT [id number] DATE OF BIRTH:01-15-1951 FACILITY: [facility] LOCATION: [facility] PHYSICIAN:Roe Wilner Zella Ball, MD  OPERATIVE REPORT  DATE OF PROCEDURE:  08/16/2018  PREOPERATIVE DIAGNOSIS:  Extensor mechanism rupture, right knee.  POSTOPERATIVE DIAGNOSIS:  Extensor mechanism rupture, right knee.  PROCEDURE:  Extensor mechanism repair, right knee.  SURGEON:  Gaynelle Arabian, MD  ASSISTANT:  Griffith Citron, PA-C  ANESTHESIA:  Spinal.  ESTIMATED BLOOD LOSS:  25  DRAINS:  Hemovac x1.  TOURNIQUET TIME:  35 minutes at 300 mmHg.  COMPLICATIONS:  None.  CONDITION:  Stable to recovery.  BRIEF CLINICAL NOTE:  The patient is a 68 year old male with complex history in regards to his right knee.  He had an irrigation and debridement and polyethylene exchange on 07/19/2018.  He did extremely well initially and then presented to the office.   After an intense physical therapy session with increased pain and inability to fully extend the knee.  He had a large hemarthrosis at that time and an aspiration of the bloody fluid then showed that there was an apparent defect medially.  I felt that he  had ruptured extensor mechanism repair.  He presents today for repair of the extensor mechanism.  PROCEDURE IN DETAIL:  After successful administration of spinal anesthetic, a tourniquet was placed on the right thigh and right lower extremity prepped and draped in the usual sterile fashion.  Extremity was wrapped in Esmarch and tourniquet inflated to  300 mmHg.  Midline incision was made with a 10 blade into the subcutaneous tissue.  There was evidence of complete disruption of the repair of the medial arthrotomy from his previous surgery.  The broken sutures are removed.  There is no evidence of any  purulence or abnormal fluid.  There was some old hematoma present.  We evacuated the hematoma and then thoroughly irrigated the entire joint with approximately 4  liters of saline using pulsatile lavage.  The tissue appeared healthy.  The extensor  mechanism was then repaired with interrupted #1 Ethibond sutures.  This had a very stable repair.  I then oversewed this with a 0 Stratafix suture.  I did not flex the knee as I did not want to place any excess tension on the repair.  Hemovac drain had  already been placed in the joint and a second limb of the Hemovac was placed in the subcutaneous tissue.  The tourniquet was released with a time of 35 minutes.  Minor bleeding was stopped with electrocautery.  Subcutaneous was then closed with  interrupted 2-0 Vicryl and skin closed with staples.  Incisions were cleaned and dried.  The drains were hooked to suction.  A sterile dressing was then applied and he was placed into a knee immobilizer and awakened and transported to recovery in stable  condition.  TN/NUANCE  D:08/16/2018 T:08/17/2018 JOB:[id number]

## 2018-08-17 NOTE — Progress Notes (Signed)
Physical Therapy Treatment Patient Details Name: Brent Dougherty MRN: [id number] DOB: 11/24/1950 Today's Date: 08/17/2018    History of Present Illness Pt s/p R TKR extensor mechanism repair.    PT Comments    Pt motivated and progressing well with mobility - ambulated increased distance with markedly increased ease of movement.  Pt continues to need min assist for R LE management with bed mobility.   Follow Up Recommendations  Follow surgeon's recommendation for DC plan and follow-up therapies     Equipment Recommendations  None recommended by PT    Recommendations for Other Services       Precautions / Restrictions Precautions Precautions: Knee;Fall Precaution Booklet Issued: Yes (comment) Precaution Comments: NO ROM R knee Required Braces or Orthoses: Knee Immobilizer - Right Knee Immobilizer - Right: On at all times Restrictions Weight Bearing Restrictions: No RLE Weight Bearing: Weight bearing as tolerated Other Position/Activity Restrictions: WBAT    Mobility  Bed Mobility Overal bed mobility: Needs Assistance Bed Mobility: Supine to Sit     Supine to sit: Min assist Sit to supine: Min assist   General bed mobility comments: min assist to manage R LE  Transfers Overall transfer level: Needs assistance Equipment used: Rolling walker (2 wheeled) Transfers: Sit to/from Stand Sit to Stand: Supervision         General transfer comment: cues for LE management and use of UEs to self assist  Ambulation/Gait Ambulation/Gait assistance: Min guard;Supervision Gait Distance (Feet): 200 Feet Assistive device: Rolling walker (2 wheeled) Gait Pattern/deviations: Step-to pattern;Step-through pattern;Decreased step length - right;Decreased step length - left;Shuffle;Trunk flexed Gait velocity: decr   General Gait Details: cues for posture, position from RW and initial sequence   Stairs             Wheelchair Mobility    Modified Rankin (Stroke Patients  Only)       Balance Overall balance assessment: Mild deficits observed, not formally tested                                          Cognition Arousal/Alertness: Awake/alert Behavior During Therapy: WFL for tasks assessed/performed Overall Cognitive Status: Within Functional Limits for tasks assessed                                        Exercises Total Joint Exercises Ankle Circles/Pumps: AROM;Both;20 reps;Supine    General Comments        Pertinent Vitals/Pain Pain Assessment: 0-10 Pain Score: 2  Pain Location: R knee Pain Descriptors / Indicators: Aching;Sore Pain Intervention(s): Limited activity within patient's tolerance;Monitored during session    Home Living Family/patient expects to be discharged to:: Private residence Living Arrangements: Spouse/significant other Available Help at Discharge: Family Type of Home: House Home Access: Level entry   Home Layout: One level Home Equipment: Environmental consultantWalker - 2 wheels;Cane - single point      Prior Function Level of Independence: Independent;Independent with assistive device(s)          PT Goals (current goals can now be found in the care plan section) Acute Rehab PT Goals Patient Stated Goal: Regain IND PT Goal Formulation: With patient Time For Goal Achievement: 08/03/18 Potential to Achieve Goals: Good Progress towards PT goals: Progressing toward goals    Frequency    7X/week  PT Plan Current plan remains appropriate    Co-evaluation              AM-PAC PT "6 Clicks" Mobility   Outcome Measure  Help needed turning from your back to your side while in a flat bed without using bedrails?: A Little Help needed moving from lying on your back to sitting on the side of a flat bed without using bedrails?: A Little Help needed moving to and from a bed to a chair (including a wheelchair)?: A Little Help needed standing up from a chair using your arms (e.g.,  wheelchair or bedside chair)?: A Little Help needed to walk in hospital room?: A Little Help needed climbing 3-5 steps with a railing? : A Little 6 Click Score: 18    End of Session Equipment Utilized During Treatment: Gait belt;Right knee immobilizer Activity Tolerance: Patient tolerated treatment well Patient left: Other (comment)(sitting EOB) Nurse Communication: Mobility status PT Visit Diagnosis: Difficulty in walking, not elsewhere classified (R26.2)     Time: 3354-5625 PT Time Calculation (min) (ACUTE ONLY): 17 min  Charges:  $Gait Training: 8-22 mins                     [place] Pager [phone] Office [phone]    Sherl Yzaguirre 08/17/2018, 3:03 PM

## 2018-08-20 NOTE — Discharge Summary (Signed)
Physician Discharge Summary   Patient ID: Brent Dougherty MRN: [id number] DOB/AGE: 67/05/1950 67 y.o.  Admit date: 08/16/2018 Discharge date: 08/20/2018  Primary Diagnosis:   Extensor mechanism tear right knee  Admission Diagnoses:  Past Medical History:  Diagnosis Date   Arthritis    BOTH KNEES   Asthma    uncomplicated   Atrial fibrillation (HCC) 01/2017   Bronchitis    LAST TIME WAS JAN 2014   Chronic insomnia 11/14/2015   Chronic left shoulder pain 01/19/2016   Dysrhythmia    Edema    Elevated blood pressure reading    History of blood transfusion 07/27/2017   History of kidney stones    Morbid obesity (HCC) 11/14/2015   Obstructive chronic bronchitis without exacerbation (HCC) 03/14/2008   OSA on CPAP    Osteoarthritis of knee 11/14/2015   PONV (postoperative nausea and vomiting)    Discharge Diagnoses:   Active Problems:   Quadriceps tendon rupture, right, initial encounter  Procedure:  Procedure(s) (LRB): KNEE REPAIR EXTENSOR MECHANISM (Right)   Consults: None  HPI: 68 yo male s/p Irrigation and debridement and polyethylene exchange on 07/19/18. He was doing well until a rigorous physical therapy session when he developed increased pain and subsequent inability to extend his knee. He presented to the office with a large effusion which was aspirated to reveal blood. 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 He presents for arthrotomy and repair of his right knee extensor mechanism   Laboratory Data: Hospital Outpatient Visit on 08/15/2018  Component Date Value Ref Range Status   Sodium 08/15/2018 136  135 - 145 mmol/L Final   Potassium 08/15/2018 4.3  3.5 - 5.1 mmol/L Final   Chloride 08/15/2018 99  98 - 111 mmol/L Final   CO2 08/15/2018 29  22 - 32 mmol/L Final   Glucose, Bld 08/15/2018 92  70 - 99 mg/dL Final   BUN 40/98/119106/30/2020 16  8 - 23 mg/dL Final     Creatinine, Ser 08/15/2018 0.98  0.61 - 1.24 mg/dL Final   Calcium 47/82/956206/30/2020 9.2  8.9 - 10.3 mg/dL Final   GFR calc non Af Amer 08/15/2018 >60  >60 mL/min Final   GFR calc Af Amer 08/15/2018 >60  >60 mL/min Final   Anion gap 08/15/2018 8  5 - 15 Final   Performed at [facility], [address] [place], [place] [address]   WBC 08/15/2018 7.0  4.0 - 10.5 K/uL Final   RBC 08/15/2018 4.72  4.22 - 5.81 MIL/uL Final   Hemoglobin 08/15/2018 14.0  13.0 - 17.0 g/dL Final   HCT 57/84/696206/30/2020 43.0  39.0 - 52.0 % Final   MCV 08/15/2018 91.1  80.0 - 100.0 fL Final   MCH 08/15/2018 29.7  26.0 - 34.0 pg Final   MCHC 08/15/2018 32.6  30.0 - 36.0 g/dL Final   RDW 95/28/413206/30/2020 13.4  11.5 - 15.5 % Final   Platelets 08/15/2018 316  150 - 400 K/uL Final   nRBC 08/15/2018 0.0  0.0 - 0.2 % Final   Performed at [facility], [address] [place], [place] [address]   EKG: Orders placed or performed during the hospital encounter of 08/16/18   EKG 12-Lead   EKG 12-Lead     Hospital Course: Patient was [facility] to [facility] and taken to the OR and underwent the above state procedure without complications.  Patient tolerated the procedure  well and was later transferred to the recovery room and then to the orthopaedic floor for postoperative care.  They were given PO and IV analgesics for pain control following their surgery.  They were given 24 hours of postoperative antibiotics.   PT was consulted postop to assist with mobility and transfers.  The patient was allowed to be WBAT with therapy and was placed in a knee immobilizer. Hemovac remained in place. Discharge planning was consulted to help with postop disposition and equipment needs.  Patient had a good night on the evening of surgery and started to get up OOB with therapy on day one. Patient was seen in rounds and was ready to go home on day one.  They were given discharge instructions and  dressing directions.  They were instructed on when to follow up in the office with Dr. Wynelle Link.   Diet: Cardiac diet Activity:WBAT Follow-up:in 5 days Disposition - Home Discharged Condition: stable   Discharge Instructions    Call MD / Call 911   Complete by: As directed    If you experience chest pain or shortness of breath, CALL 911 and be transported to the hospital emergency room.  If you develope a fever above 101 F, pus (white drainage) or increased drainage or redness at the wound, or calf pain, call your surgeon's office.   Call MD / Call 911   Complete by: As directed    If you experience chest pain or shortness of breath, CALL 911 and be transported to the hospital emergency room.  If you develope a fever above 101 F, pus (white drainage) or increased drainage or redness at the wound, or calf pain, call your surgeon's office.   Change dressing   Complete by: As directed    Change dressing on Friday, then change the dressing daily with sterile 4 x 4 inch gauze dressing   Constipation Prevention   Complete by: As directed    Drink plenty of fluids.  Prune juice may be helpful.  You may use a stool softener, such as Colace (over the counter) 100 mg twice a day.  Use MiraLax (over the counter) for constipation as needed.   Constipation Prevention   Complete by: As directed    Drink plenty of fluids.  Prune juice may be helpful.  You may use a stool softener, such as Colace (over the counter) 100 mg twice a day.  Use MiraLax (over the counter) for constipation as needed.   Diet - low sodium heart healthy   Complete by: As directed    Diet - low sodium heart healthy   Complete by: As directed    Discharge instructions   Complete by: As directed    Dr. Gaynelle Arabian [facility] [address], [place], [place] [address] [phone]  POSTOPERATIVE DIRECTIONS   Keep right leg in knee immobilizer at all times until your next follow-up  appointment. No range of motion of the right knee until instructed to do so.  HOME CARE INSTRUCTIONS  Remove items at home which could result in a fall. This includes throw rugs or furniture in walking pathways.  ICE to the affected knee every three hours for 30 minutes at a time and then as needed for pain and swelling.  Continue to use ice on the knee for pain and swelling from surgery. You may notice swelling that will progress down to the foot and ankle.  This is normal after surgery.  Elevate the  leg when you are not up walking on it.   Continue to use the breathing machine which will help keep your temperature down.  It is common for your temperature to cycle up and down following surgery, especially at night when you are not up moving around and exerting yourself.  The breathing machine keeps your lungs expanded and your temperature down. Do not place pillow under knee, focus on keeping the knee straight while resting  DIET You may resume your previous home diet once your are discharged from the hospital.  DRESSING / WOUND CARE / SHOWERING You may change your dressing 3-5 days after surgery.  Then change the dressing every day with sterile gauze.  Please use good hand washing techniques before changing the dressing.  Do not use any lotions or creams on the incision until instructed by your surgeon. You may start showering once you are discharged home but do not submerge the incision under water. Just pat the incision dry and apply a dry gauze dressing on daily. Change the surgical dressing daily and reapply a dry dressing each time.  ACTIVITY Walk with your walker as instructed. Use walker as long as suggested by your caregivers. Avoid periods of inactivity such as sitting longer than an hour when not asleep. This helps prevent blood clots.  You may resume a sexual relationship in one month or when given the OK by your doctor.  You may return to work once you are cleared by your doctor.    Do not drive a car for 6 weeks or until released by you surgeon.  Do not drive while taking narcotics.  WEIGHT BEARING Weight bearing as tolerated with assist device (walker, cane, etc) as directed, use it as long as suggested by your surgeon or therapist, typically at least 4-6 weeks.  POSTOPERATIVE CONSTIPATION PROTOCOL Constipation - defined medically as fewer than three stools per week and severe constipation as less than one stool per week.  One of the most common issues patients have following surgery is constipation.  Even if you have a regular bowel pattern at home, your normal regimen is likely to be disrupted due to multiple reasons following surgery.  Combination of anesthesia, postoperative narcotics, change in appetite and fluid intake all can affect your bowels.  In order to avoid complications following surgery, here are some recommendations in order to help you during your recovery period.  Colace (docusate) - Pick up an over-the-counter form of Colace or another stool softener and take twice a day as long as you are requiring postoperative pain medications.  Take with a full glass of water daily.  If you experience loose stools or diarrhea, hold the colace until you stool forms back up.  If your symptoms do not get better within 1 week or if they get worse, check with your doctor.  Dulcolax (bisacodyl) - Pick up over-the-counter and take as directed by the product packaging as needed to assist with the movement of your bowels.  Take with a full glass of water.  Use this product as needed if not relieved by Colace only.   MiraLax (polyethylene glycol) - Pick up over-the-counter to have on hand.  MiraLax is a solution that will increase the amount of water in your bowels to assist with bowel movements.  Take as directed and can mix with a glass of water, juice, soda, coffee, or tea.  Take if you go more than two days without a movement. Do not use MiraLax more than once  per day. Call  your doctor if you are still constipated or irregular after using this medication for 7 days in a row.  If you continue to have problems with postoperative constipation, please contact the office for further assistance and recommendations.  If you experience "the worst abdominal pain ever" or develop nausea or vomiting, please contact the office immediatly for further recommendations for treatment.  ITCHING If you experience itching with your medications, try taking only a single pain pill, or even half a pain pill at a time.  You can also use Benadryl over the counter for itching or also to help with sleep.   TED HOSE STOCKINGS Wear the elastic stockings on both legs for three weeks following surgery during the day but you may remove then at night for sleeping.  MEDICATIONS See your medication summary on the "After Visit Summary" that the nursing staff will review with you prior to discharge.  You may have some home medications which will be placed on hold until you complete the course of blood thinner medication.  It is important for you to complete the blood thinner medication as prescribed by your surgeon.  Continue your approved medications as instructed at time of discharge.  PRECAUTIONS If you experience chest pain or shortness of breath - call 911 immediately for transfer to the hospital emergency department.  If you develop a fever greater that 101 F, purulent drainage from wound, increased redness or drainage from wound, foul odor from the wound/dressing, or calf pain - CONTACT YOUR SURGEON.                                                   FOLLOW-UP APPOINTMENTS Make sure you keep all of your appointments after your operation with your surgeon and caregivers. You should call the office at the above phone number and make an appointment for approximately two weeks after the date of your surgery or on the date instructed by your surgeon outlined in the "After Visit Summary".  IF YOU ARE  TRANSFERRED TO A SKILLED REHAB FACILITY If the patient is transferred to a skilled rehab facility following release from the hospital, a list of the current medications will be sent to the facility for the patient to continue.  When discharged from the skilled rehab facility, please have the facility set up the patient's Home Health Physical Therapy prior to being released. Also, the skilled facility will be responsible for providing the patient with their medications at time of release from the facility to include their pain medication, the muscle relaxants, and their blood thinner medication. If the patient is still at the rehab facility at time of the two week follow up appointment, the skilled rehab facility will also need to assist the patient in arranging follow up appointment in our office and any transportation needs.  MAKE SURE YOU:  Understand these instructions.  Get help right away if you are not doing well or get worse.    Pick up stool softner and laxative for home use following surgery while on pain medications. Do not submerge incision under water. Please use good hand washing techniques while changing dressing each day. May shower starting three days after surgery. Please use a clean towel to pat the incision dry following showers. Continue to use ice for pain and swelling after surgery. Do not use  any lotions or creams on the incision until instructed by your surgeon.   Increase activity slowly as tolerated   Complete by: As directed    Weight bearing as tolerated   Complete by: As directed      Allergies as of 08/17/2018      Reactions   Morphine And Related Other (See Comments)   MILD NAUSEA      Medication List    STOP taking these medications   Xarelto 20 MG Tabs tablet Generic drug: rivaroxaban     TAKE these medications   aspirin 325 MG EC tablet Take 1 tablet (325 mg total) by mouth daily with breakfast.   ciprofloxacin 500 MG tablet Commonly known as:  CIPRO Take 500 mg by mouth 2 (two) times a day.   diphenhydramine-acetaminophen 25-500 MG Tabs tablet Commonly known as: TYLENOL PM Take 2 tablets by mouth at bedtime as needed (sleep).   flecainide 50 MG tablet Commonly known as: TAMBOCOR TAKE ONE AND ONE-HALF TABLETS TWICE A DAY What changed: See the new instructions.   hydrochlorothiazide 12.5 MG capsule Commonly known as: MICROZIDE TAKE 1 CAPSULE TWICE A DAY   methocarbamol 500 MG tablet Commonly known as: ROBAXIN Take 1 tablet (500 mg total) by mouth every 6 (six) hours as needed for muscle spasms.   metoprolol tartrate 25 MG tablet Commonly known as: LOPRESSOR TAKE 1 TABLET TWICE A DAY   oxyCODONE 5 MG immediate release tablet Commonly known as: Oxy IR/ROXICODONE Take 1-2 tablets (5-10 mg total) by mouth every 6 (six) hours as needed for moderate pain (pain score 4-6).   traMADol 50 MG tablet Commonly known as: ULTRAM Take 1-2 tablets (50-100 mg total) by mouth every 6 (six) hours as needed for moderate pain (use oxycodone 1st).   zolpidem 10 MG tablet Commonly known as: AMBIEN 1/2 or 1 tab at bedtime for sleep as needed What changed:   how much to take  how to take this  when to take this  additional instructions            Discharge Care Instructions  (From admission, onward)         Start     Ordered   08/16/18 0000  Weight bearing as tolerated     08/16/18 2042   08/16/18 0000  Change dressing    Comments: Change dressing on Friday, then change the dressing daily with sterile 4 x 4 inch gauze dressing   08/16/18 2042         Follow-up Information    Ollen GrossAluisio, Frank, MD. Schedule an appointment as soon as possible for a visit on 08/31/2018.   Specialty: Orthopedic Surgery Contact information: [address] [place] [address] [phone]           Signed: Dimitri PedAmber Ayvah Caroll, PA-C Orthopaedic Surgery 08/20/2018, 8:31 PM

## 2018-08-27 NOTE — Progress Notes (Deleted)
Cardiology Office Note   Date:  08/27/2018   ID:  Lillard, Bailon 02/01/1951, MRN [id number]  PCP:  Christain Sacramento, MD  Cardiologist:   Jenkins Rouge, MD   No chief complaint on file.     History of Present Illness:  68 y.o. first seen December 2018 for new onset afib. Had poorly controlled HTN Diet with lots of caffeine and Mountain Dew  Obese With OSA wears CPAP. CHAdVASC Score 2.   He was started on xarelto , beta blocker and diuretic   Echo reviewed 01/27/17 EF 55-60% normal atrial sizes   Labs reviewed 01/17/17   LDL 126 HDL 60 K 4.6 Cr .96 normal LFT;s Hct 44.6   Ultimately had [place] on 03/08/17 Failed to convert. Started on flecainide 06/01/17  ETT f/u normal   Cardioversion repeat posponed due to right TKR revision by Dr Elmyra Ricks done 06/09/93 Complicated by effusion and ? Infection Unable to fully anticoagulate  08/16/18 had extensor mechanism tear repair of right knee with Dr Maureen Ralphs Had bloody effusion drained Prior to needing surgical repair Has been off blood thinners since admission   ECG 08/16/18 showed afib rate 82  ? Rash with cardizem   ***  Past Medical History:  Diagnosis Date  . Arthritis    BOTH KNEES  . Asthma    uncomplicated  . Atrial fibrillation ([place]) 01/2017  . Bronchitis    LAST TIME WAS JAN 2014  . Chronic insomnia 11/14/2015  . Chronic left shoulder pain 01/19/2016  . Dysrhythmia   . Edema   . Elevated blood pressure reading   . History of blood transfusion 07/27/2017  . History of kidney stones   . Morbid obesity ([place]) 11/14/2015  . Obstructive chronic bronchitis without exacerbation ([place]) 03/14/2008  . OSA on CPAP   . Osteoarthritis of knee 11/14/2015  . PONV (postoperative nausea and vomiting)     Past Surgical History:  Procedure Laterality Date  . BILATERAL KNEE ARTHROSCOPY    . CARDIOVERSION N/A 03/08/2017   Procedure: CARDIOVERSION;  Surgeon: Josue Hector, MD;  Location: [facility]OSCOPY;  Service: Cardiovascular;   Laterality: N/A;  . I&D KNEE WITH POLY EXCHANGE Right 07/19/2018   Procedure: IRRIGATION AND DEBRIDEMENT KNEE WITH POLY EXCHANGE;  Surgeon: Gaynelle Arabian, MD;  Location: [facility] ORS;  Service: Orthopedics;  Laterality: Right;  . KNEE REPAIR EXTENSOR MECHANISM Right 08/16/2018   Procedure: KNEE REPAIR EXTENSOR MECHANISM;  Surgeon: Gaynelle Arabian, MD;  Location: [facility] ORS;  Service: Orthopedics;  Laterality: Right;  31min  . PARTIAL AMPUTATION LEFT THUMB AND TENDON REPAIR    . TONSILLECTOMY AND ADENOIDECTOMY    . TOTAL KNEE ARTHROPLASTY Right 06/19/2012   Procedure: RIGHT TOTAL KNEE ARTHROPLASTY;  Surgeon: Gearlean Alf, MD;  Location: [facility];  Service: Orthopedics;  Laterality: Right;  . TOTAL KNEE REVISION Right 07/27/2017   Procedure: RIGHT KNEE POLYETHYLENE REVISION;  Surgeon: Gaynelle Arabian, MD;  Location: [facility] OR[facility];  Service: Orthopedics;  Laterality: Right;  Adductor Block     Current Outpatient Medications  Medication Sig Dispense Refill  . aspirin EC 325 MG EC tablet Take 1 tablet (325 mg total) by mouth daily with breakfast. 30 tablet 0  . ciprofloxacin (CIPRO) 500 MG tablet Take 500 mg by mouth 2 (two) times a day.    . diphenhydramine-acetaminophen (TYLENOL PM) 25-500 MG TABS tablet Take 2 tablets by mouth at bedtime as needed (sleep).     . flecainide (TAMBOCOR) 50 MG tablet TAKE ONE AND  ONE-HALF TABLETS TWICE A DAY (Patient taking differently: Take 75 mg by mouth 2 (two) times daily. ) 270 tablet 0  . hydrochlorothiazide (MICROZIDE) 12.5 MG capsule TAKE 1 CAPSULE TWICE A DAY (Patient taking differently: Take 12.5 mg by mouth 2 (two) times daily. ) 180 capsule 0  . methocarbamol (ROBAXIN) 500 MG tablet Take 1 tablet (500 mg total) by mouth every 6 (six) hours as needed for muscle spasms. 40 tablet 0  . metoprolol tartrate (LOPRESSOR) 25 MG tablet TAKE 1 TABLET TWICE A DAY (Patient taking differently: Take 25 mg by mouth 2 (two) times daily. ) 180 tablet 1  . oxyCODONE (OXY IR/ROXICODONE) 5 MG  immediate release tablet Take 1-2 tablets (5-10 mg total) by mouth every 6 (six) hours as needed for moderate pain (pain score 4-6). 56 tablet 0  . traMADol (ULTRAM) 50 MG tablet Take 1-2 tablets (50-100 mg total) by mouth every 6 (six) hours as needed for moderate pain (use oxycodone 1st). 40 tablet 0  . zolpidem (AMBIEN) 10 MG tablet 1/2 or 1 tab at bedtime for sleep as needed (Patient taking differently: Take 10 mg by mouth at bedtime. ) 30 tablet 5   No current facility-administered medications for this visit.     Allergies:   Morphine and related    Social History:  The patient  reports that he has never smoked. He has never used smokeless tobacco. He reports that he does not drink alcohol or use drugs.   Family History:  The patient's family history includes Cancer in his mother; Cancer - Other in his father and paternal grandfather; Diabetes in his father and paternal grandfather; Heart disease in his father and paternal grandfather; Prostate cancer in his paternal grandfather.    ROS:  Please see the history of present illness.   Otherwise, review of systems are positive for none.   All other systems are reviewed and negative.    PHYSICAL EXAM: VS:  There were no vitals taken for this visit. , BMI There is no height or weight on file to calculate BMI. Affect appropriate Obese male  HEENT: normal Neck supple with no adenopathy JVP normal no bruits no thyromegaly Lungs clear with no wheezing and good diaphragmatic motion Heart:  S1/S2 no murmur, no rub, gallop or click PMI normal Abdomen: benighn, BS positve, no tenderness, no AAA no bruit.  No HSM or HJR Distal pulses intact with no bruits No edema Neuro non-focal Skin warm and dry Post right TKR still warm to touch erythematous with effusion to my exam    EKG:  06/01/17 afib rate 61 otherwise normal   Recent Labs: 07/14/2018: ALT 16 08/17/2018: BUN 11; Creatinine, Ser 0.78; Hemoglobin 13.1; Platelets 275; Potassium 3.8;  Sodium 134    Lipid Panel No results found for: CHOL, TRIG, HDL, CHOLHDL, VLDL, LDLCALC, LDLDIRECT    Wt Readings from Last 3 Encounters:  08/16/18 (!) 313 lb 4 oz (142.1 kg)  08/15/18 (!) 313 lb 4 oz (142.1 kg)  07/19/18 (!) 323 lb (146.5 kg)      Other studies Reviewed: Additional studies/ records that were reviewed today include: [facility] labs ECG.    ASSESSMENT AND PLAN:  1.  Afib:  On beta blocker and xarelto failed DCC on 03/08/17 start flecainide 75 bid ETT done 06/17/17 with no pro arrhythmia or ischemia  [facility] surgery done 07/28/17  And prolonged complications with bloody effusion and recent repeat surgery 08/17/18 ? Rash with  cardizem *** 2. HTN: Well controlled.  Continue current medications and low sodium Dash type diet.     3. Edema: from venous insuficiency and obesity with some skin breakdown and stasis Continue diuretic  4. Ortho: post right TKR with revision done 07/28/17 and repeat repair 08/17/18 *** 5. OSA:  Encouraged him to wear nightly and not " when I need it" Link to PAF discussed 6. Obesity:  Discussed bariatric surgery. Has lost weight in past with weight watchers.     Charlton HawsPeter Nishan

## 2018-08-30 ENCOUNTER — Ambulatory Visit: Payer: BLUE CROSS/BLUE SHIELD | Admitting: Cardiovascular Disease

## 2018-08-31 ENCOUNTER — Telehealth: Payer: BLUE CROSS/BLUE SHIELD | Admitting: Cardiovascular Disease

## 2018-09-16 ENCOUNTER — Other Ambulatory Visit: Payer: Self-pay | Admitting: Cardiovascular Disease

## 2018-10-30 ENCOUNTER — Other Ambulatory Visit: Payer: Self-pay | Admitting: Cardiovascular Disease

## 2018-10-30 NOTE — Telephone Encounter (Signed)
Xarelto 20mg  refill request received; pt is 68 years old, weight-136 kg, Crea-0.78 on 08/17/2018, last seen by Dr. Johnsie Cancel on 01/05/2018, Diagnosis-Afib, CrCl-179.102ml/mim; Dose is appropriate based on dosing criteria.   Per D/C note from hospital on 08/16/2018 pt's Xarelto was discontinued by Dr. Mare Ferrari MD. Hulen Skains pt and he stated that he was having a problem with the blood not clotting so Dr. Wynelle Link placed him on ASA for 5 days and then he followed up with him and he told the pt to resume the Xarelto and he states he has been taking since his appt with Dr. Wynelle Link after his hospital follow-up. He states he is not taking the Aspirin anymore at all.

## 2018-11-06 ENCOUNTER — Other Ambulatory Visit: Payer: Self-pay

## 2018-11-06 ENCOUNTER — Encounter: Payer: Self-pay | Admitting: Internal Medicine

## 2018-11-06 ENCOUNTER — Ambulatory Visit (INDEPENDENT_AMBULATORY_CARE_PROVIDER_SITE_OTHER): Payer: BC Managed Care – PPO | Admitting: Internal Medicine

## 2018-11-06 VITALS — BP 130/80 | HR 76 | Temp 98.1°F | Ht 73.0 in | Wt 320.0 lb

## 2018-11-06 DIAGNOSIS — G4733 Obstructive sleep apnea (adult) (pediatric): Secondary | ICD-10-CM | POA: Diagnosis not present

## 2018-11-06 DIAGNOSIS — Z23 Encounter for immunization: Secondary | ICD-10-CM | POA: Diagnosis not present

## 2018-11-06 DIAGNOSIS — I4819 Other persistent atrial fibrillation: Secondary | ICD-10-CM | POA: Diagnosis not present

## 2018-11-06 DIAGNOSIS — S76111A Strain of right quadriceps muscle, fascia and tendon, initial encounter: Secondary | ICD-10-CM

## 2018-11-06 DIAGNOSIS — F5104 Psychophysiologic insomnia: Secondary | ICD-10-CM

## 2018-11-06 MED ORDER — ZOLPIDEM TARTRATE 10 MG PO TABS: ORAL_TABLET | ORAL | 5 refills | Status: DC

## 2018-11-06 NOTE — Patient Instructions (Signed)
Order- DME [facility]  Please replace headgear, mask and supplies. Continue CPAP auto 4-20, mask of choice, humidifier, supplies, AirView/ card  Order- Flu vax senior  Please call if we can help

## 2018-11-06 NOTE — Progress Notes (Signed)
HPI male never smoker followed for OSA, insomnia, complicated by morbid obesity, asthma/bronchitis, PAFib/ Xarelto,  Previously seen at this office for asthmatic bronchitis LOV 2010  gets flu shot at work He complains of poor quality sleep/ Insomnia.. 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 Pt wears CPAP nightly; DL attached. Pt will need order for new supplies-face mask at this time.  Weight today 305 pounds Download 83% compliance, AHI 2.7/ hr. To get flu vax at work. 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  Ambien 10 mg most nights-discussed.  11/06/2018- 68 year old male never smoker followed for OSA, insomnia, complicated by Morbid Obesity, asthma/bronchitis, PAFib/ Xarelto,  CPAP auto 4-20/[facility] 7/1-7/5- Ruptured quadriceps- surgical knee repair. Prior infected knee repair. Prior TKR. -----OSA on CPAP 4-20, DME: [facility], no complaints, pt states his mask's nasal strap is almost two years old and will likely need to be replaced Body weight today 320 lbs Download compliance 97%, AHI 3/ hr Chronic AFib pending another cardioversion.  ROS-see HPI   + = positive Constitutional:    weight loss, night sweats, fevers, chills, + fatigue, lassitude. HEENT:    headaches, difficulty swallowing, tooth/dental problems, sore throat,   sneezing, itching, ear ache, nasal congestion, post nasal drip, snoring CV:    chest pain, orthopnea, PND, swelling in lower extremities, anasarca,                                                     dizziness, palpitations Resp:   shortness of breath with exertion or at rest.                productive cough,   non-productive cough, coughing up of blood.              change in color of mucus.  wheezing.   Skin:    rash or lesions. GI:  No-   heartburn, indigestion, abdominal pain, nausea, vomiting,  GU:  MS:   joint pain, stiffness, decreased range of motion, back pain. Neuro-     nothing unusual Psych:  change in mood or affect.  depression or anxiety.   memory loss.  OBJ- Physical Exam General- Alert, Oriented, Affect-appropriate, Distress- none acute, +morbidly obese Skin- rash-none, lesions- none, excoriation- none Lymphadenopathy- none Head- atraumatic            Eyes- Gross vision intact, PERRLA, conjunctivae and secretions clear            Ears- Hearing, canals-normal            Nose- Clear, no-Septal dev, mucus, polyps, erosion, perforation             Throat- Mallampati IV, mucosa clear , drainage- none, tonsils- atrophic Neck- flexible , trachea midline, no stridor , thyroid nl, carotid no bruit Chest - symmetrical excursion ,  unlabored           Heart/CV- RRR , no murmur , no gallop  , no rub, nl s1 s2                           - JVD- none , edema- none, stasis changes- none, varices- none           Lung- clear to P&A, wheeze- none, cough- none , dullness-none, rub- none           Chest wall-  Abd-  Br/ Gen/ Rectal- Not done, not indicated Extrem- +R knee swollen.. Neuro- grossly intact to observation

## 2018-11-06 NOTE — Assessment & Plan Note (Signed)
Followed by cardiology with upcoming cardioversion

## 2018-11-06 NOTE — Assessment & Plan Note (Signed)
Benefits from CPAP with good compliance and control.  Plan- continue CPAP auto 4-20. Replace mask, headgear, supplies

## 2018-11-06 NOTE — Assessment & Plan Note (Signed)
Asks refill ambien. We discussed eventual change from this as he gets older. He is a big tall man and 10 mg dose is reasonable. Discussed side effects. He has not had prob with sleep walking.

## 2018-11-06 NOTE — Assessment & Plan Note (Signed)
Denies respiratory complications during his surgeries. Xarelto for his AFib also reduces risk of DVT.

## 2018-11-26 ENCOUNTER — Other Ambulatory Visit: Payer: Self-pay | Admitting: Internal Medicine

## 2018-12-03 ENCOUNTER — Other Ambulatory Visit: Payer: Self-pay | Admitting: Internal Medicine

## 2018-12-16 ENCOUNTER — Other Ambulatory Visit: Payer: Self-pay | Admitting: Cardiovascular Disease

## 2018-12-22 ENCOUNTER — Other Ambulatory Visit: Payer: Self-pay | Admitting: Cardiovascular Disease

## 2018-12-29 NOTE — Progress Notes (Signed)
Cardiology Office Note   Date:  01/02/2019   ID:  Jiraiya, Mcewan November 14, 1950, MRN [id number]  PCP:  Christain Sacramento, MD  Cardiologist:   Jenkins Rouge, MD   No chief complaint on file.     History of Present Illness:  68 y.o. first seen December 2018 for new onset afib. Had poorly controlled HTN Diet with lots of caffeine and Mountain Dew ObeseWith OSA wears CPAP. CHAdVASC Score 2.   He was started on xarelto , beta blocker and diuretic   Echo reviewed 01/27/17 EF 55-60% normal atrial sizes   Ultimately had [place] on 03/08/17 Failed to convert. Started on flecainide 06/01/17  ETT f/u normal   Cardioversion repeat posponed due to right TKR revision by Dr Elmyra Ricks done 9/92/42 Complicated by effusion and ? Infection Unable to fully anticoagulate Had to have another surgery 08/16/18 with extensor mechanism rupture Post op had rash ? Due to different batch of cardizem pills   No cardiac symptoms compliant with xarelto  Willing to have repeat [facility] end of January   He is still unhappy about the progress of his knee. Discussed scheduling [facility] and he seemed hesitant Concerned that his wife needs right shoulder surgery soon. Last [facility] his skin got "red" do to repeated Attempts at conversion that were not successful. Discussed the idea of trying again now that he is on AAT  Past Medical History:  Diagnosis Date  . Arthritis    BOTH KNEES  . Asthma    uncomplicated  . Atrial fibrillation ([place]) 01/2017  . Bronchitis    LAST TIME WAS JAN 2014  . Chronic insomnia 11/14/2015  . Chronic left shoulder pain 01/19/2016  . Dysrhythmia   . Edema   . Elevated blood pressure reading   . History of blood transfusion 07/27/2017  . History of kidney stones   . Morbid obesity ([place]) 11/14/2015  . Obstructive chronic bronchitis without exacerbation ([place]) 03/14/2008  . OSA on CPAP   . Osteoarthritis of knee 11/14/2015  . PONV (postoperative nausea and vomiting)     Past Surgical History:   Procedure Laterality Date  . BILATERAL KNEE ARTHROSCOPY    . CARDIOVERSION N/A 03/08/2017   Procedure: CARDIOVERSION;  Surgeon: Josue Hector, MD;  Location: [facility] ENDOSCOPY;  Service: Cardiovascular;  Laterality: N/A;  . I&D KNEE WITH POLY EXCHANGE Right 07/19/2018   Procedure: IRRIGATION AND DEBRIDEMENT KNEE WITH POLY EXCHANGE;  Surgeon: Gaynelle Arabian, MD;  Location: [facility] ORS;  Service: Orthopedics;  Laterality: Right;  . KNEE REPAIR EXTENSOR MECHANISM Right 08/16/2018   Procedure: KNEE REPAIR EXTENSOR MECHANISM;  Surgeon: Gaynelle Arabian, MD;  Location: [facility]S;  Service: Orthopedics;  Laterality: Right;  51min  . PARTIAL AMPUTATION LEFT THUMB AND TENDON REPAIR    . TONSILLECTOMY AND ADENOIDECTOMY    . TOTAL KNEE ARTHROPLASTY Right 06/19/2012   Procedure: RIGHT TOTAL KNEE ARTHROPLASTY;  Surgeon: Gearlean Alf, MD;  Location: [facility]S;  Service: Orthopedics;  Laterality: Right;  . TOTAL KNEE REVISION Right 07/27/2017   Procedure: RIGHT KNEE POLYETHYLENE REVISION;  Surgeon: Gaynelle Arabian, MD;  Location: [facility]S;  Service: Orthopedics;  Laterality: Right;  Adductor Block     Current Outpatient Medications  Medication Sig Dispense Refill  . acetaminophen (TYLENOL) 325 MG tablet Take 325 mg by mouth every 6 (six) hours as needed. For surgical knee pain as needed    . ciprofloxacin (CIPRO) 500 MG tablet Take 500 mg by mouth 2 (two) times a day.    Marland Kitchen  diphenhydramine-acetaminophen (TYLENOL PM) 25-500 MG TABS tablet Take 2 tablets by mouth at bedtime as needed (sleep).     . flecainide (TAMBOCOR) 50 MG tablet Take 1 tablet (50 mg total) by mouth 2 (two) times daily. Please keep upcoming appt in November with Dr. Eden Emms for future refills. Thank you 270 tablet 0  . hydrochlorothiazide (MICROZIDE) 12.5 MG capsule Take 1 capsule (12.5 mg total) by mouth 2 (two) times daily. Please keep upcoming appt in November with Dr. Eden Emms for future refills. Thank you 180 capsule 0  . methocarbamol (ROBAXIN) 500 MG tablet  Take 1 tablet (500 mg total) by mouth every 6 (six) hours as needed for muscle spasms. 40 tablet 0  . metoprolol tartrate (LOPRESSOR) 25 MG tablet TAKE 1 TABLET TWICE A DAY 180 tablet 3  . oxyCODONE (OXY IR/ROXICODONE) 5 MG immediate release tablet Take 1-2 tablets (5-10 mg total) by mouth every 6 (six) hours as needed for moderate pain (pain score 4-6). 56 tablet 0  . traMADol (ULTRAM) 50 MG tablet Take 1-2 tablets (50-100 mg total) by mouth every 6 (six) hours as needed for moderate pain (use oxycodone 1st). 40 tablet 0  . XARELTO 20 MG TABS tablet TAKE 1 TABLET DAILY WITH SUPPER 90 tablet 1  . zolpidem (AMBIEN) 10 MG tablet 1/2 or 1 tab at bedtime for sleep as needed 30 tablet 5   No current facility-administered medications for this visit.     Allergies:   Morphine and related    Social History:  The patient  reports that he has never smoked. He has never used smokeless tobacco. He reports that he does not drink alcohol or use drugs.   Family History:  The patient's family history includes Cancer in his mother; Cancer - Other in his father and paternal grandfather; Diabetes in his father and paternal grandfather; Heart disease in his father and paternal grandfather; Prostate cancer in his paternal grandfather.    ROS:  Please see the history of present illness.   Otherwise, review of systems are positive for none.   All other systems are reviewed and negative.    PHYSICAL EXAM: VS:  BP 122/86   Pulse 74   Ht 6\' 1"  (1.854 m)   Wt (!) 326 lb (147.9 kg)   SpO2 98%   BMI 43.01 kg/m  , BMI Body mass index is 43.01 kg/m. Affect appropriate Obese male  HEENT: normal Neck supple with no adenopathy JVP normal no bruits no thyromegaly Lungs clear with no wheezing and good diaphragmatic motion Heart:  S1/S2 no murmur, no rub, gallop or click PMI normal Abdomen: benighn, BS positve, no tenderness, no AAA no bruit.  No HSM or HJR Distal pulses intact with no bruits No edema Neuro  non-focal Skin warm and dry Post right TKR     EKG:  06/01/17 afib rate 61 otherwise normal   Recent Labs: 07/14/2018: ALT 16 08/17/2018: BUN 11; Creatinine, Ser 0.78; Hemoglobin 13.1; Platelets 275; Potassium 3.8; Sodium 134    Lipid Panel No results found for: CHOL, TRIG, HDL, CHOLHDL, VLDL, LDLCALC, LDLDIRECT    Wt Readings from Last 3 Encounters:  01/02/19 (!) 326 lb (147.9 kg)  11/06/18 (!) 320 lb (145.2 kg)  08/16/18 (!) 313 lb 4 oz (142.1 kg)      Other studies Reviewed: Additional studies/ records that were reviewed today include: [facility] ECG. Op notes Dr 10/17/18    ASSESSMENT AND PLAN:  1.  Afib:  On beta  blocker and xarelto failed DCC on 03/08/17 start flecainide 75 bid ETT done 06/17/17 with no pro arrhythmia or ischemia  [facility] done [facility]  And 08/16/18  And complications with effusion since then  [facility] still worried about coming to hospital with COVID Will see first part of January and [facility] mg bid latter part of that month  2. HTN: Well controlled.  Continue current medications and low sodium Dash type diet.     3. Edema: from venous insuficiency and obesity with some skin breakdown and stasis Continue diuretic  4. Ortho: post right TKR with revision x 2  Last one 08/16/18 Dr [name] still needing Rx had ruptured tendon during PT    5. OSA:  Encouraged him to wear nightly and not " when I need it" Link to PAF discussed 6. Obesity:  Discussed bariatric surgery. Has lost weight in past with weight watchers.     Charlton HawsPeter Tymeka Privette

## 2019-01-02 ENCOUNTER — Other Ambulatory Visit: Payer: Self-pay

## 2019-01-02 ENCOUNTER — Ambulatory Visit (INDEPENDENT_AMBULATORY_CARE_PROVIDER_SITE_OTHER): Payer: BC Managed Care – PPO | Admitting: Cardiovascular Disease

## 2019-01-02 ENCOUNTER — Encounter: Payer: Self-pay | Admitting: Cardiovascular Disease

## 2019-01-02 VITALS — BP 122/86 | HR 74 | Ht 73.0 in | Wt 326.0 lb

## 2019-01-02 DIAGNOSIS — I480 Paroxysmal atrial fibrillation: Secondary | ICD-10-CM | POA: Diagnosis not present

## 2019-01-02 NOTE — Patient Instructions (Signed)
Medication Instructions:   *If you need a refill on your cardiac medications before your next appointment, please call your pharmacy*  Lab Work:  If you have labs (blood work) drawn today and your tests are completely normal, you will receive your results only by: Marland Kitchen MyChart Message (if you have MyChart) OR . A paper copy in the mail If you have any lab test that is abnormal or we need to change your treatment, we will call you to review the results.  Testing/Procedures: None ordered today.  Follow-Up: At [facility], you and your health needs are our priority.  As part of our continuing mission to provide you with exceptional heart care, we have created designated Provider Care Teams.  These Care Teams include your primary Cardiologist (physician) and Advanced Practice Providers (APPs -  Physician Assistants and Nurse Practitioners) who all work together to provide you with the care you need, when you need it.  Your next appointment:   7 week(s) with Dr. Johnsie Cancel or with Atrial Fib clinic   The format for your next appointment:   In Person

## 2019-02-26 NOTE — Progress Notes (Deleted)
Cardiology Office Note   Date:  02/26/2019   ID:  Brent Dougherty, Brent Dougherty [birth year], MRN [id number]  PCP:  Christain Sacramento, MD  Cardiologist:   Jenkins Rouge, MD   No chief complaint on file.     History of Present Illness:  69 y.o. first seen December 2018 for new onset afib. Had poorly controlled HTN Diet with lots of caffeine and Mountain Dew ObeseWith OSA wears CPAP. CHAdVASC Score 2.   He was started on xarelto , beta blocker and diuretic   Echo reviewed 01/27/17 EF 55-60% normal atrial sizes   Ultimately had [place] on 03/08/17 Failed to convert. Started on flecainide 06/01/17  ETT f/u normal   Cardioversion repeat posponed due to right TKR revision by Dr Elmyra Ricks done 4/85/46 Complicated by effusion and ? Infection Unable to fully anticoagulate Had to have another surgery 08/16/18 with extensor mechanism rupture Post op had rash ? Due to different batch of cardizem pills   No cardiac symptoms compliant with xarelto  Here to discuss repeat [facility]  ***  Past Medical History:  Diagnosis Date  . Arthritis    BOTH KNEES  . Asthma    uncomplicated  . Atrial fibrillation ([place]) 01/2017  . Bronchitis    LAST TIME WAS JAN 2014  . Chronic insomnia 11/14/2015  . Chronic left shoulder pain 01/19/2016  . Dysrhythmia   . Edema   . Elevated blood pressure reading   . History of blood transfusion 07/27/2017  . History of kidney stones   . Morbid obesity ([place]) 11/14/2015  . Obstructive chronic bronchitis without exacerbation ([place]) 03/14/2008  . OSA on CPAP   . Osteoarthritis of knee 11/14/2015  . PONV (postoperative nausea and vomiting)     Past Surgical History:  Procedure Laterality Date  . BILATERAL KNEE ARTHROSCOPY    . CARDIOVERSION N/A 03/08/2017   Procedure: CARDIOVERSION;  Surgeon: Josue Hector, MD;  Location: [facility] ENDOSCOPY;  Service: Cardiovascular;  Laterality: N/A;  . I & D KNEE WITH POLY EXCHANGE Right 07/19/2018   Procedure: IRRIGATION AND DEBRIDEMENT KNEE WITH POLY  EXCHANGE;  Surgeon: Gaynelle Arabian, MD;  [facility];  Service: Orthopedics;  Laterality: Right;  . KNEE REPAIR EXTENSOR MECHANISM Right 08/16/2018   Procedure: KNEE REPAIR EXTENSOR MECHANISM;  Surgeon: Gaynelle Arabian, MD;  [facility];  Service: Orthopedics;  Laterality: Right;  5min  . PARTIAL AMPUTATION LEFT THUMB AND TENDON REPAIR    . TONSILLECTOMY AND ADENOIDECTOMY    . TOTAL KNEE ARTHROPLASTY Right 06/19/2012   Procedure: RIGHT TOTAL KNEE ARTHROPLASTY;  Surgeon: Gearlean Alf, MD;  [facility]: [facility];  Service: Orthopedics;  Laterality: Right;  . TOTAL KNEE REVISION Right 07/27/2017   Procedure: RIGHT KNEE POLYETHYLENE REVISION;  Surgeon: Gaynelle Arabian, MD;  [facility];  Service: Orthopedics;  Laterality: Right;  Adductor Block     Current Outpatient Medications  Medication Sig Dispense Refill  . acetaminophen (TYLENOL) 325 MG tablet Take 325 mg by mouth every 6 (six) hours as needed. For surgical knee pain as needed    . ciprofloxacin (CIPRO) 500 MG tablet Take 500 mg by mouth 2 (two) times a day.    . diphenhydramine-acetaminophen (TYLENOL PM) 25-500 MG TABS tablet Take 2 tablets by mouth at bedtime as needed (sleep).     . flecainide (TAMBOCOR) 50 MG tablet Take 1 tablet (50 mg total) by mouth 2 (two) times daily. Please keep upcoming appt in November with Dr. Johnsie Cancel for future refills. Thank  you 270 tablet 0  . hydrochlorothiazide (MICROZIDE) 12.5 MG capsule Take 1 capsule (12.5 mg total) by mouth 2 (two) times daily. Please keep upcoming appt in November with Dr. Eden Emms for future refills. Thank you 180 capsule 0  . methocarbamol (ROBAXIN) 500 MG tablet Take 1 tablet (500 mg total) by mouth every 6 (six) hours as needed for muscle spasms. 40 tablet 0  . metoprolol tartrate (LOPRESSOR) 25 MG tablet TAKE 1 TABLET TWICE A DAY 180 tablet 3  . oxyCODONE (OXY IR/ROXICODONE) 5 MG immediate release tablet Take 1-2 tablets (5-10 mg total) by mouth every 6 (six) hours as needed  for moderate pain (pain score 4-6). 56 tablet 0  . traMADol (ULTRAM) 50 MG tablet Take 1-2 tablets (50-100 mg total) by mouth every 6 (six) hours as needed for moderate pain (use oxycodone 1st). 40 tablet 0  . XARELTO 20 MG TABS tablet TAKE 1 TABLET DAILY WITH SUPPER 90 tablet 1  . zolpidem (AMBIEN) 10 MG tablet 1/2 or 1 tab at bedtime for sleep as needed 30 tablet 5   No current facility-administered medications for this visit.    Allergies:   Morphine and related    Social History:  The patient  reports that he has never smoked. He has never used smokeless tobacco. He reports that he does not drink alcohol or use drugs.   Family History:  The patient's family history includes Cancer in his mother; Cancer - Other in his father and paternal grandfather; Diabetes in his father and paternal grandfather; Heart disease in his father and paternal grandfather; Prostate cancer in his paternal grandfather.    ROS:  Please see the history of present illness.   Otherwise, review of systems are positive for none.   All other systems are reviewed and negative.    PHYSICAL EXAM: VS:  There were no vitals taken for this visit. , BMI There is no height or weight on file to calculate BMI. Affect appropriate Obese male  HEENT: normal Neck supple with no adenopathy JVP normal no bruits no thyromegaly Lungs clear with no wheezing and good diaphragmatic motion Heart:  S1/S2 no murmur, no rub, gallop or click PMI normal Abdomen: benighn, BS positve, no tenderness, no AAA no bruit.  No HSM or HJR Distal pulses intact with no bruits Plus one edema with stasis   Neuro non-focal Skin warm and dry Post right TKR x 2     EKG:  06/01/17 afib rate 61 otherwise normal   Recent Labs: 07/14/2018: ALT 16 08/17/2018: BUN 11; Creatinine, Ser 0.78; Hemoglobin 13.1; Platelets 275; Potassium 3.8; Sodium 134    Lipid Panel No results found for: CHOL, TRIG, HDL, CHOLHDL, VLDL, LDLCALC, LDLDIRECT    Wt Readings  from Last 3 Encounters:  01/02/19 (!) 326 lb (147.9 kg)  11/06/18 (!) 320 lb (145.2 kg)  08/16/18 (!) 313 lb 4 oz (142.1 kg)      Other studies Reviewed: Additional studies/ records that were reviewed today include: [facility]. Op notes Dr Despina Hick    ASSESSMENT AND PLAN:  1.  Afib:  On beta blocker and xarelto failed DCC on 03/08/17 start flecainide 75 bid ETT done 06/17/17 with no pro arrhythmia or ischemia  [facility] posponed due to right knee surgery done 07/28/17  And 08/16/18  And complications with effusion since then  Willing to consider [facility] *** 2. HTN: Well controlled.  Continue current medications and low sodium Dash type diet.  3. Edema: from venous insuficiency and obesity with some skin breakdown and stasis Continue diuretic  4. Ortho: post right TKR with revision x 2  Last one 08/16/18 Dr Despina Hick complicated by ruptured tendon during PT    5. OSA:  Encouraged him to wear nightly and not " when I need it" Link to PAF discussed 6. Obesity:  Discussed bariatric surgery. Has lost weight in past with weight watchers.     [name]

## 2019-03-01 ENCOUNTER — Telehealth: Payer: Self-pay | Admitting: Cardiovascular Disease

## 2019-03-01 NOTE — Telephone Encounter (Signed)
New Message  Pt's wife wants someone to call her back to see if she can schedule her husband a virtual appt. She said if not, they would be willing to schedule another day  Please call to advise

## 2019-03-02 NOTE — Telephone Encounter (Signed)
Called patient's wife back (DPR). Patient does not want to come in the office or do a cardioversion with COVID going on. Patient would like to put it off. Patient's wife thinks patient should at least have a virtual visit to discuss this with Dr. Eden Emms. Scheduled patient to see Dr. Eden Emms as virtual visit next Tuesday.

## 2019-03-02 NOTE — Telephone Encounter (Signed)
Left message for patient to call back  

## 2019-03-06 ENCOUNTER — Telehealth: Payer: BC Managed Care – PPO | Admitting: Cardiovascular Disease

## 2019-03-06 ENCOUNTER — Ambulatory Visit: Payer: BC Managed Care – PPO | Admitting: Cardiovascular Disease

## 2019-03-12 ENCOUNTER — Telehealth: Payer: BC Managed Care – PPO | Admitting: Cardiovascular Disease

## 2019-03-17 ENCOUNTER — Other Ambulatory Visit: Payer: Self-pay | Admitting: Cardiovascular Disease

## 2019-04-01 ENCOUNTER — Ambulatory Visit: Payer: BC Managed Care – PPO

## 2019-04-16 ENCOUNTER — Telehealth: Payer: Self-pay

## 2019-04-16 NOTE — Telephone Encounter (Signed)
**Note De-Identified Albertha Beattie Obfuscation** I started a Xarelto PA (per fax received from [employer]) through covermymeds and received this immediate response:  Chrystie Nose Key: [id number] - PA Case ID: [id number]  Outcome  Approved today  Case [id number] Status:Approved; Review Type:Prior Auth Coverage Start Date:03/17/2019;Coverage End Date:04/15/2020 DrugXarelto 20MG  tablets  Form[employer] Electronic PA Form [phone] NCPDP)

## 2019-04-29 ENCOUNTER — Other Ambulatory Visit: Payer: Self-pay | Admitting: Cardiovascular Disease

## 2019-04-30 NOTE — Telephone Encounter (Signed)
Prescription refill request for Xarelto received.   Last office visit: Nishan, 01/02/2019 Weight: 147.9 kg Age: 69 y.o. Scr: 0.78, 08/17/2018 CrCl:191.8 kg  Prescription refill sent

## 2019-05-07 ENCOUNTER — Other Ambulatory Visit: Payer: Self-pay | Admitting: Internal Medicine

## 2019-05-07 MED ORDER — ZOLPIDEM TARTRATE 10 MG PO TABS: ORAL_TABLET | ORAL | 5 refills | Status: DC

## 2019-08-17 ENCOUNTER — Other Ambulatory Visit: Payer: Self-pay | Admitting: Cardiovascular Disease

## 2019-08-17 NOTE — Telephone Encounter (Addendum)
Prescription refill sent.

## 2019-08-17 NOTE — Telephone Encounter (Addendum)
Prescription refill request for Xarelto received.   Last office visit: Brent Dougherty, 01/02/2019 Weight: 147.9 kg Age: 69 y.o. Scr: 0.93, 02/02/2019 CrCl: 161 ml/min

## 2019-08-25 ENCOUNTER — Emergency Department (HOSPITAL_COMMUNITY)
Admission: EM | Admit: 2019-08-25 | Discharge: 2019-08-25 | Disposition: A | Discharge status: Home / Self Care | Payer: BC Managed Care – PPO | Attending: Emergency Medicine | Admitting: Emergency Medicine

## 2019-08-25 ENCOUNTER — Emergency Department (HOSPITAL_COMMUNITY): Payer: BC Managed Care – PPO

## 2019-08-25 ENCOUNTER — Other Ambulatory Visit: Payer: Self-pay

## 2019-08-25 ENCOUNTER — Encounter (HOSPITAL_COMMUNITY): Payer: Self-pay

## 2019-08-25 DIAGNOSIS — Y999 Unspecified external cause status: Secondary | ICD-10-CM | POA: Diagnosis not present

## 2019-08-25 DIAGNOSIS — Y939 Activity, unspecified: Secondary | ICD-10-CM | POA: Diagnosis not present

## 2019-08-25 DIAGNOSIS — Y929 Unspecified place or not applicable: Secondary | ICD-10-CM | POA: Diagnosis not present

## 2019-08-25 DIAGNOSIS — S61217A Laceration without foreign body of left little finger without damage to nail, initial encounter: Secondary | ICD-10-CM | POA: Diagnosis not present

## 2019-08-25 DIAGNOSIS — W230XXA Caught, crushed, jammed, or pinched between moving objects, initial encounter: Secondary | ICD-10-CM | POA: Diagnosis not present

## 2019-08-25 DIAGNOSIS — S65507A Unspecified injury of blood vessel of left little finger, initial encounter: Secondary | ICD-10-CM | POA: Diagnosis present

## 2019-08-25 DIAGNOSIS — J45909 Unspecified asthma, uncomplicated: Secondary | ICD-10-CM | POA: Insufficient documentation

## 2019-08-25 MED ORDER — LIDOCAINE HCL (PF) 1 % IJ SOLN
5.0000 mL | Freq: Once | INTRAMUSCULAR | Status: AC
  Administered 2019-08-25: 5 mL via INTRADERMAL
  Filled 2019-08-25: qty 30

## 2019-08-25 MED ORDER — BACITRACIN ZINC 500 UNIT/GM EX OINT: TOPICAL_OINTMENT | Freq: Once | CUTANEOUS | Status: AC

## 2019-08-25 NOTE — ED Triage Notes (Signed)
Pt got left pinky finger caught in door. Horizontal laceration to anterior pinky. Bleeding controlled. Last tetanus in April 2019

## 2019-08-25 NOTE — Discharge Instructions (Signed)
Your x-rays today did not show any evidence of underlying bony abnormality.  As we discussed, you can take the antibiotics that you are prescribed by orthopedic doctor to prevent infection.  Please touch base with him for further consultation and evaluation.  Keep the wound clean and dry for the first 24 hours. After that you may gently clean the wound with soap and water. Make sure to pat dry the wound before covering it with any dressing. You can use topical antibiotic ointment and bandage. Ice and elevate for pain relief.   You can take Tylenol or Ibuprofen as directed for pain. You can alternate Tylenol and Ibuprofen every 4 hours for additional pain relief.   Return to the Emergency Department, your primary care doctor, or the [facility] in 5-7 days for suture removal.   Monitor closely for any signs of infection. Return to the Emergency Department for any worsening redness/swelling of the area that begins to spread, drainage from the site, worsening pain, fever or any other worsening or concerning symptoms.

## 2019-08-25 NOTE — ED Provider Notes (Signed)
[facility] EMERGENCY DEPARTMENT Provider Note   CSN: [id number] Arrival date & time: 08/25/19  1738     History Chief Complaint  Patient presents with   Laceration    Brent Dougherty is a 69 y.o. male who presents for evaluation of laceration to the left fifth digit that occurred about 5 PM this evening.  He reports that the pinky got caught in between the car door and states that it cut the dorsal aspect of his left little finger.  He states his last tetanus was in April 2019.  He is on Xarelto blood thinners.  He denies any numbness/weakness.  The history is provided by the patient.       Past Medical History:  Diagnosis Date   Arthritis    BOTH KNEES   Asthma    uncomplicated   Atrial fibrillation (HCC) 01/2017   Bronchitis    LAST TIME WAS JAN 2014   Chronic insomnia 11/14/2015   Chronic left shoulder pain 01/19/2016   Dysrhythmia    Edema    Elevated blood pressure reading    History of blood transfusion 07/27/2017   History of kidney stones    Morbid obesity (HCC) 11/14/2015   Obstructive chronic bronchitis without exacerbation (HCC) 03/14/2008   OSA on CPAP    Osteoarthritis of knee 11/14/2015   PONV (postoperative nausea and vomiting)     Patient Active Problem List   Diagnosis Date Noted   Quadriceps tendon rupture, right, initial encounter 08/16/2018   Septic arthritis of knee, right (HCC) 07/19/2018   Failed total knee arthroplasty (HCC) 07/27/2017   PONV (postoperative nausea and vomiting)    Persistent atrial fibrillation (HCC)    History of kidney stones    Elevated blood pressure reading    Bronchitis    Asthma    Arthritis    Chronic left shoulder pain 01/19/2016   Osteoarthritis of knee 11/14/2015   Morbid obesity (HCC) 11/14/2015   Chronic insomnia 11/14/2015   Obstructive sleep apnea 12/06/2014   Obesity, morbid (HCC) 11/05/2014   Insomnia 11/05/2014   OA (osteoarthritis) of knee 06/19/2012    Obstructive chronic bronchitis without exacerbation (HCC) 03/14/2008   OTHER GENERAL SYMPTOMS 02/29/2008   Other general symptoms and signs 02/29/2008    Past Surgical History:  Procedure Laterality Date   BILATERAL KNEE ARTHROSCOPY     CARDIOVERSION N/A 03/08/2017   Procedure: CARDIOVERSION;  Surgeon: [name], MD;  Location: [facility] ENDOSCOPY;  Service: Cardiovascular;  Laterality: N/A;   I & D KNEE WITH POLY EXCHANGE Right 07/19/2018   Procedure: IRRIGATION AND DEBRIDEMENT KNEE WITH POLY EXCHANGE;  Surgeon: [name], MD;  Location: [facility] ORS;  Service: Orthopedics;  Laterality: Right;   KNEE REPAIR EXTENSOR MECHANISM Right 08/16/2018   Procedure: KNEE REPAIR EXTENSOR MECHANISM;  Surgeon: [name], MD;  Location: [facility] ORS;  Service: Orthopedics;  Laterality: Right;    PARTIAL AMPUTATION LEFT THUMB AND TENDON REPAIR     TONSILLECTOMY AND ADENOIDECTOMY     TOTAL KNEE ARTHROPLASTY Right 06/19/2012   Procedure: RIGHT TOTAL KNEE ARTHROPLASTY;  Surgeon: [name], MD;  Location: [facility] ORS;  Service: Orthopedics;  Laterality: Right;   TOTAL KNEE REVISION Right 07/27/2017   Procedure: RIGHT KNEE POLYETHYLENE REVISION;  Surgeon: [name], MD;  Location: [facility] ORS;  Service: Orthopedics;  Laterality: Right;  Adductor Block       Family History  Problem Relation Age of Onset   Heart disease Father    Diabetes Father  Cancer - Other Father        SKIN   Cancer Mother        BRAIN   Heart disease Paternal Grandfather    Diabetes Paternal Grandfather    Prostate cancer Paternal Grandfather    Cancer - Other Paternal Grandfather        SKIN   Colon cancer Neg Hx    Colon polyps Neg Hx    Esophageal cancer Neg Hx    Rectal cancer Neg Hx    Stomach cancer Neg Hx     Social History   Tobacco Use   Smoking status: Never Smoker   Smokeless tobacco: Never Used  Vaping Use   Vaping Use: Never used  Substance Use Topics   Alcohol use: No    Drug use: No    Home Medications Prior to Admission medications   Medication Sig Start Date End Date Taking? Authorizing Provider  acetaminophen (TYLENOL) 325 MG tablet Take 325 mg by mouth every 6 (six) hours as needed. For surgical knee pain as needed    [provider]  ciprofloxacin (CIPRO) 500 MG tablet Take 500 mg by mouth 2 (two) times a day. 03/07/18   [provider]  diphenhydramine-acetaminophen (TYLENOL PM) 25-500 MG TABS tablet Take 2 tablets by mouth at bedtime as needed (sleep).     [provider]  flecainide (TAMBOCOR) 50 MG tablet Take 1 tablet (50 mg total) by mouth 2 (two) times daily. 03/19/19   Wendall StadeNishan, Peter C, MD  hydrochlorothiazide (MICROZIDE) 12.5 MG capsule Take 1 capsule (12.5 mg total) by mouth 2 (two) times daily. 03/19/19   Wendall StadeNishan, Peter C, MD  methocarbamol (ROBAXIN) 500 MG tablet Take 1 tablet (500 mg total) by mouth every 6 (six) hours as needed for muscle spasms. 07/20/18   Edmisten, Lyn HollingsheadKristie L, PA  metoprolol tartrate (LOPRESSOR) 25 MG tablet TAKE 1 TABLET TWICE A DAY 12/22/18   Wendall StadeNishan, Peter C, MD  oxyCODONE (OXY IR/ROXICODONE) 5 MG immediate release tablet Take 1-2 tablets (5-10 mg total) by mouth every 6 (six) hours as needed for moderate pain (pain score 4-6). 07/20/18   Edmisten, Lyn HollingsheadKristie L, PA  traMADol (ULTRAM) 50 MG tablet Take 1-2 tablets (50-100 mg total) by mouth every 6 (six) hours as needed for moderate pain (use oxycodone 1st). 07/20/18   Edmisten, Lyn HollingsheadKristie L, PA  XARELTO 20 MG TABS tablet TAKE 1 TABLET DAILY WITH SUPPER 08/21/19   Wendall StadeNishan, Peter C, MD  zolpidem (AMBIEN) 10 MG tablet 1/2 or 1 tab at bedtime for sleep as needed 05/07/19   Jetty DuhamelYoung, Clinton D, MD    Allergies    Morphine and related  Review of Systems   Review of Systems  Skin: Positive for wound.  Neurological: Negative for weakness and numbness.  All other systems reviewed and are negative.   Physical Exam Updated Vital Signs BP (!) 143/95 (BP Location: Right Arm)     Pulse 82    Temp 98.3 F (36.8 C) (Oral)    Resp 15    Ht 6\' 1"  (1.854 m)    Wt (!) 147.4 kg    SpO2 100%    BMI 42.88 kg/m   Physical Exam Vitals and nursing note reviewed.  Constitutional:      Appearance: He is well-developed.  HENT:     Head: Normocephalic and atraumatic.  Eyes:     General: No scleral icterus.       Right eye: No discharge.  Left eye: No discharge.     Conjunctiva/sclera: Conjunctivae normal.  Cardiovascular:     Pulses:          Radial pulses are 2+ on the right side and 2+ on the left side.  Pulmonary:     Effort: Pulmonary effort is normal.  Musculoskeletal:     Comments: Flexion/extension of the DIP and PIP of the left fifth digit intact without any difficulty.  He can fully flex and extend all 5 digits of left hand.  Skin:    General: Skin is warm and dry.     Comments: 2 cm curvilinear laceration just proximal to the PIP joint.  It does not involve the PIP joint. Good distal cap refill.  LUE is not dusky in appearance or cool to touch.  Neurological:     Mental Status: He is alert.     Comments: Sensation intact along major nerve distributions of BUE  Psychiatric:        Speech: Speech normal.        Behavior: Behavior normal.     ED Results / Procedures / Treatments   Labs (all labs ordered are listed, but only abnormal results are displayed) Labs Reviewed - No data to display  EKG None  Radiology DG Finger Little Left  Result Date: 08/25/2019 CLINICAL DATA:  Acute LEFT little finger injury and pain. Initial encounter. EXAM: LEFT LITTLE FINGER 2+V COMPARISON:  None. FINDINGS: There is no evidence of fracture or dislocation. There is no evidence of arthropathy or other focal bone abnormality. Soft tissues are unremarkable. IMPRESSION: Negative. Electronically Signed   By: Harmon Pier M.D.   On: 08/25/2019 19:03    Procedures .Marland KitchenLaceration Repair  Date/Time: 08/25/2019 7:16 PM Performed by: Maxwell Caul, PA-C Authorized by:  Maxwell Caul, PA-C   Consent:    Consent obtained:  Verbal   Consent given by:  Patient   Risks discussed:  Infection, need for additional repair, pain, poor cosmetic result and poor wound healing   Alternatives discussed:  No treatment and delayed treatment Universal protocol:    Procedure explained and questions answered to patient or proxy's satisfaction: yes     Relevant documents present and verified: yes     Test results available and properly labeled: yes     Imaging studies available: yes     Required blood products, implants, devices, and special equipment available: yes     Site/side marked: yes     Immediately prior to procedure, a time out was called: yes     Patient identity confirmed:  Verbally with patient Anesthesia (see MAR for exact dosages):    Anesthesia method:  Local infiltration   Local anesthetic:  Lidocaine 1% w/o epi Laceration details:    Location:  Finger   Finger location:  L small finger   Length (cm):  2 Repair type:    Repair type:  Simple Pre-procedure details:    Preparation:  Patient was prepped and draped in usual sterile fashion and imaging obtained to evaluate for foreign bodies Exploration:    Hemostasis achieved with:  Direct pressure   Wound exploration: wound explored through full range of motion     Wound extent: no foreign bodies/material noted and no tendon damage noted   Treatment:    Area cleansed with:  Betadine   Amount of cleaning:  Extensive   Irrigation solution:  Sterile saline   Irrigation method:  Syringe   Visualized foreign bodies/material removed: no  Skin repair:    Repair method:  Sutures   Suture size:  4-0   Suture material:  Nylon   Suture technique:  Simple interrupted   Number of sutures:  5 Approximation:    Approximation:  Close Post-procedure details:    Dressing:  Antibiotic ointment, sterile dressing and splint for protection   Patient tolerance of procedure:  Tolerated well, no immediate  complications Comments:     After the wound was anesthetized, it was thoroughly simply irrigated with sterile saline.  No evidence of foreign body.  The wound was explored through full range of motion which showed no evidence of foreign body.  There was no underlying tendon damage.  He had no difficulty with flexion sections of both the PIP and the DIP.  The wound was sutured as documented above.   (including critical care time)  Medications Ordered in ED Medications  bacitracin ointment (has no administration in time range)  lidocaine (PF) (XYLOCAINE) 1 % injection 5 mL (5 mLs Intradermal Given 08/25/19 1856)    ED Course  I have reviewed the triage vital signs and the nursing notes.  Pertinent labs & imaging results that were available during my care of the patient were reviewed by me and considered in my medical decision making (see chart for details).    MDM Rules/Calculators/A&P                          69 year old male who presents for evaluation of left fifth digit laceration that occurred about 5 PM this evening.  Patient reports that he got his finger caught between a door and states that he suffered laceration to the dorsal aspect of his left fifth digit.  His tetanus is up-to-date.  He is on Xarelto.  On initially arrival, he is afebrile nontoxic-appearing.  Vital signs are stable. Patient is neurovascularly intact.  On exam, he has a 2 cm curvilinear laceration just proximal to the PIP of the left fifth digit.  It does not involve the PIP itself.  Full flexion/tension intact any difficulty.  We will plan to obtain imaging to ensure that there is no underlying bony abnormality.  Plan for wound care.  X-ray reviewed.  No evidence of acute foreign body or bony abnormality.  Laceration repaired as documented above.  Patient tolerated procedure well.  Patient does have history of infection to his knee after hardware.  He states that he gets prescriptions for antibiotics from his  orthopedic to have just in case he would get another wound to prevent seeding infection from his knee.  I discussed with patient that he can do a's short 5-day course of antibiotics and discuss with his orthopedic surgeon. At this time, patient exhibits no emergent life-threatening condition that require further evaluation in ED or admission. Patient had ample opportunity for questions and discussion. All patient's questions were answered with full understanding. Strict return precautions discussed. Patient expresses understanding and agreement to plan.   Portions of this note were generated with Scientist, clinical (histocompatibility and immunogenetics). Dictation errors may occur despite best attempts at proofreading.   Final Clinical Impression(s) / ED Diagnoses Final diagnoses:  Laceration of left little finger without foreign body without damage to nail, initial encounter    Rx / DC Orders ED Discharge Orders    None       Rosana Hoes 08/25/19 1924    Sabas Sous, MD 08/25/19 2307

## 2019-10-05 ENCOUNTER — Other Ambulatory Visit: Payer: Self-pay

## 2019-10-05 ENCOUNTER — Telehealth: Payer: Self-pay | Admitting: Cardiovascular Disease

## 2019-10-05 MED ORDER — RIVAROXABAN 20 MG PO TABS: ORAL_TABLET | ORAL | 1 refills | Status: DC

## 2019-10-05 NOTE — Telephone Encounter (Signed)
*  STAT* If patient is at the pharmacy, call can be transferred to refill team.   1. Which medications need to be refilled? (please list name of each medication and dose if known) Xarelto 20 mg  2. Which pharmacy/location (including street and city if local pharmacy) is medication to be sent to? [facility] [place]  3. Do they need a 30 day or 90 day supply? 90 patient is oout.

## 2019-10-05 NOTE — Telephone Encounter (Signed)
Prescription refill request for Xarelto received.  Indication: Atrial Fibrillation Last office visit: 01/02/2019 Nishan Weight:147.4 kg Age: 69 Scr: 0.93 01/2019 CrCl: 160.7 ml/min  Prescription refilled

## 2019-11-08 ENCOUNTER — Ambulatory Visit: Payer: BC Managed Care – PPO | Admitting: Internal Medicine

## 2019-11-12 ENCOUNTER — Encounter: Payer: Self-pay | Admitting: Internal Medicine

## 2019-11-12 ENCOUNTER — Other Ambulatory Visit: Payer: Self-pay

## 2019-11-12 ENCOUNTER — Ambulatory Visit (INDEPENDENT_AMBULATORY_CARE_PROVIDER_SITE_OTHER): Payer: BC Managed Care – PPO | Admitting: Internal Medicine

## 2019-11-12 DIAGNOSIS — G4733 Obstructive sleep apnea (adult) (pediatric): Secondary | ICD-10-CM | POA: Diagnosis not present

## 2019-11-12 DIAGNOSIS — F5101 Primary insomnia: Secondary | ICD-10-CM | POA: Diagnosis not present

## 2019-11-12 DIAGNOSIS — J449 Chronic obstructive pulmonary disease, unspecified: Secondary | ICD-10-CM

## 2019-11-12 MED ORDER — ZOLPIDEM TARTRATE 10 MG PO TABS: ORAL_TABLET | ORAL | 5 refills | Status: DC

## 2019-11-12 NOTE — Patient Instructions (Signed)
We  Can continue CPAP auto 4-20  Refill script sent for zolpidem  Please call if we can help

## 2019-11-12 NOTE — Assessment & Plan Note (Signed)
Exercise limited by his problems after knee replacement. Significant long-term life style change needed to get to normal body weight.

## 2019-11-12 NOTE — Assessment & Plan Note (Signed)
Ambien has worked well, used within guidelines

## 2019-11-12 NOTE — Progress Notes (Signed)
HPI male never smoker followed for OSA, insomnia, complicated by morbid obesity, asthma/bronchitis, PAFib/ Xarelto,  Previously seen at this office for asthmatic bronchitis LOV 2010  gets flu shot at work He complains of poor quality sleep/ Insomnia.. 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 Unattended Home Sleep Test- 11/23/14- severe obstructive sleep apnea, AHI 68.3/ hr, desat to 63%, weight 344 lbs  ----------------------------------------------------------------------------------------------   11/12/19- 69 year old male never smoker followed for OSA, insomnia, complicated by Morbid Obesity, asthma/bronchitis, PAFib/ Xarelto, R TKR w revision,  CPAP auto 4-20/[facility] Download- compliance 97%, AHI 4.2/ hr Body weight today- 328 lbs Covid vax- 2 Moderna Flu vax- Plans standard at work Ambien 10, Tylenol PM,  -----Pt states after receiving the high dose flu vaccine last flu season he began to have problems with joint pain. pt stated symptoms began about 2 weeks after receiving the vaccine. Pt states he has been doing well with the CPAP and denies any problems. Comfortable with CPAP, reviewed download.  He plans to get standard flu vax at work, avoiding " high dose" he blames for myalgias last year.  Pending another cardioversion try with Dr Eden Emms. Asks refill ambien, used as directed  ROS-see HPI   + = positive Constitutional:    weight loss, night sweats, fevers, chills, + fatigue, lassitude. HEENT:    headaches, difficulty swallowing, tooth/dental problems, sore throat,       sneezing, itching, ear ache, nasal congestion, post nasal drip, snoring CV:    chest pain, orthopnea, PND, swelling in lower extremities, anasarca,                                                     dizziness,  palpitations Resp:   shortness of breath with exertion or at rest.                productive cough,   non-productive cough, coughing up of blood.              change in color of mucus.  wheezing.   Skin:    rash or lesions. GI:  No-   heartburn, indigestion, abdominal pain, nausea, vomiting,  GU:  MS:   joint pain, stiffness, decreased range of motion, back pain. Neuro-     nothing unusual Psych:  change in mood or affect.  depression or anxiety.   memory loss.  OBJ- Physical Exam General- Alert, Oriented, Affect-appropriate, Distress- none acute, +morbidly obese Skin- rash-none, lesions- none, excoriation- none Lymphadenopathy- none Head- atraumatic            Eyes- Gross vision intact, PERRLA, conjunctivae and secretions clear            Ears- Hearing, canals-normal            Nose- Clear, no-Septal dev, mucus, polyps, erosion, perforation             Throat- Mallampati IV, mucosa clear , drainage- none, tonsils- atrophic Neck- flexible , trachea midline, no stridor , thyroid nl, carotid no bruit Chest - symmetrical excursion , unlabored           Heart/CV- +IRR/ Afib ,  no murmur , no gallop  , no rub, nl s1 s2                           - JVD- none , edema- none, stasis changes- none, varices- none           Lung- clear to P&A, wheeze- none, cough- none , dullness-none, rub- none           Chest wall-  Abd-  Br/ Gen/ Rectal- Not done, not indicated Extrem-  cane Neuro- grossly intact to observation

## 2019-11-12 NOTE — Assessment & Plan Note (Signed)
Benefits from CPAP with good compliance and control Plan- continue auto 4-20

## 2019-11-12 NOTE — Assessment & Plan Note (Signed)
Clear on exam without recent exacerbation

## 2019-11-22 NOTE — Progress Notes (Signed)
Cardiology Office Note   Date:  12/04/2019   ID:  Armen, Waring 26-Jul-1950, MRN [id number]  PCP:  [name], MD  Cardiologist:   [name], MD   No chief complaint on file.     History of Present Illness:  70 y.o. first seen December 2018 for new onset afib. Had poorly controlled HTN Diet with lots of caffeine and Mountain Dew ObeseWith OSA wears CPAP. CHAdVASC Score 2.   He was started on xarelto , beta blocker and diuretic   Echo reviewed 01/27/17 EF 55-60% normal atrial sizes   Ultimately had DCC on 03/08/17 Failed to convert. Started on flecainide 06/01/17  ETT f/u normal   Cardioversion repeat posponed due to right TKR revision by [name] done 07/28/17 Complicated by effusion and ? Infection Unable to fully anticoagulate Had to have another surgery 08/16/18 with extensor mechanism rupture Post op had rash ? Due to different batch of cardizem pills   No cardiac symptoms compliant with xarelto  Willing to have repeat [facility] end of January   He is still unhappy about the progress of his knee. Last [facility] his skin got "red" do to repeated Attempts at conversion that were not successful. Discussed the idea of trying again now that he is on AAT  Seen in ER July with left finger laceration caught hand in door   He is recovering from COVID has had negative test was fully vaccinated Did get monoclonal infusion At [facility] Sense of smell / taste still abnormal but breathing back to baseline  Noted afib rates faster during illness Wants to arrange [facility] has not missed any doses of blood thinner   Past Medical History:  Diagnosis Date  . Arthritis    BOTH KNEES  . Asthma    uncomplicated  . Atrial fibrillation (HCC) 01/2017  . Bronchitis    LAST TIME WAS JAN 2014  . Chronic insomnia 11/14/2015  . Chronic left shoulder pain 01/19/2016  . Dysrhythmia   . Edema   . Elevated blood pressure reading   . History of blood transfusion 07/27/2017  . History of  kidney stones   . Morbid obesity (HCC) 11/14/2015  . Obstructive chronic bronchitis without exacerbation (HCC) 03/14/2008  . OSA on CPAP   . Osteoarthritis of knee 11/14/2015  . PONV (postoperative nausea and vomiting)     Past Surgical History:  Procedure Laterality Date  . BILATERAL KNEE ARTHROSCOPY    . CARDIOVERSION N/A 03/08/2017   Procedure: CARDIOVERSION;  Surgeon: [name], MD;  Location: [facility] ENDOSCOPY;  Service: Cardiovascular;  Laterality: N/A;  . I & D KNEE WITH POLY EXCHANGE Right 07/19/2018   Procedure: IRRIGATION AND DEBRIDEMENT KNEE WITH POLY EXCHANGE;  Surgeon: [name], MD;  Location: [facility] ORS;  Service: Orthopedics;  Laterality: Right;  . KNEE REPAIR EXTENSOR MECHANISM Right 08/16/2018   Procedure: KNEE REPAIR EXTENSOR MECHANISM;  Surgeon: [name], MD;  Location: [facility];  Service: Orthopedics;  Laterality: Right;   . PARTIAL AMPUTATION LEFT THUMB AND TENDON REPAIR    . TONSILLECTOMY AND ADENOIDECTOMY    . TOTAL KNEE ARTHROPLASTY Right 06/19/2012   Procedure: RIGHT TOTAL KNEE ARTHROPLASTY;  Surgeon: [name], MD;  Location: [facility];  Service: Orthopedics;  Laterality: Right;  . TOTAL KNEE REVISION Right 07/27/2017   Procedure: RIGHT KNEE POLYETHYLENE REVISION;  Surgeon: [name], MD;  Location: [facility];  Service: Orthopedics;  Laterality: Right;  Adductor Block  Current Outpatient Medications  Medication Sig Dispense Refill  . acetaminophen (TYLENOL) 325 MG tablet Take 325 mg by mouth every 6 (six) hours as needed. For surgical knee pain as needed    . diphenhydramine-acetaminophen (TYLENOL PM) 25-500 MG TABS tablet Take 2 tablets by mouth at bedtime as needed (sleep).     . flecainide (TAMBOCOR) 50 MG tablet Take 75 mg by mouth 2 (two) times daily.    . hydrochlorothiazide (MICROZIDE) 12.5 MG capsule Take 1 capsule (12.5 mg total) by mouth 2 (two) times daily. 180 capsule 3  . metoprolol tartrate (LOPRESSOR) 25 MG tablet TAKE 1 TABLET  TWICE A DAY 180 tablet 3  . rivaroxaban (XARELTO) 20 MG TABS tablet TAKE 1 TABLET DAILY WITH SUPPER 90 tablet 1  . zolpidem (AMBIEN) 10 MG tablet 1/2 or 1 tab at bedtime for sleep as needed 30 tablet 5   No current facility-administered medications for this visit.    Allergies:   Morphine and related    Social History:  The patient  reports that he has never smoked. He has never used smokeless tobacco. He reports that he does not drink alcohol and does not use drugs.   Family History:  The patient's family history includes Cancer in his mother; Cancer - Other in his father and paternal grandfather; Diabetes in his father and paternal grandfather; Heart disease in his father and paternal grandfather; Prostate cancer in his paternal grandfather.    ROS:  Please see the history of present illness.   Otherwise, review of systems are positive for none.   All other systems are reviewed and negative.    PHYSICAL EXAM: VS:  BP (!) 144/88   Pulse 79   Ht 6\' 1"  (1.854 m)   Wt (!) 327 lb 9.6 oz (148.6 kg)   SpO2 97%   BMI 43.22 kg/m  , BMI Body mass index is 43.22 kg/m. Affect appropriate Obese male  HEENT: normal Neck supple with no adenopathy JVP normal no bruits no thyromegaly Lungs clear with no wheezing and good diaphragmatic motion Heart:  S1/S2 no murmur, no rub, gallop or click PMI normal Abdomen: benighn, BS positve, no tenderness, no AAA no bruit.  No HSM or HJR Distal pulses intact with no bruits No edema Neuro non-focal Skin warm and dry Post right TKR     EKG:  06/01/17 afib rate 61 otherwise normal   Recent Labs: No results found for requested labs within last 8760 hours.    Lipid Panel No results found for: CHOL, TRIG, HDL, CHOLHDL, VLDL, LDLCALC, LDLDIRECT    Wt Readings from Last 3 Encounters:  12/04/19 (!) 327 lb 9.6 oz (148.6 kg)  11/12/19 (!) 328 lb (148.8 kg)  08/25/19 (!) 325 lb (147.4 kg)      Other studies Reviewed: Additional studies/  records that were reviewed today include: [facility] ECG. Op notes Dr 10/26/19    ASSESSMENT AND PLAN:  1.  Afib:  On beta blocker and xarelto failed DCC on 03/08/17 start flecainide 75 bid ETT done 06/17/17 with no pro arrhythmia or ischemia  [facility] posponed due to right knee surgery done 07/28/17  And 08/16/18  And complications with effusion since then  Have scheduled [facility] for Monday 10/25 Orders written Endoscopy called. Risks including stroke, intubation hypotension and need for pacing discussed willing to proceed.  2. HTN: Well controlled.  Continue current medications and low sodium Dash type diet.     3. Edema: from venous insuficiency  and obesity with some skin breakdown and stasis Continue diuretic  4. Ortho: post right TKR with revision x 2  Last one 08/16/18 Dr Despina Hick still needing Rx had ruptured tendon during PT    5. OSA:  Encouraged him to wear nightly and not " when I need it" Link to PAF discussed f/u Dr Maple Hudson  6. Obesity:  Discussed bariatric surgery. Has lost weight in past with weight watchers.   F/u post [facility]   [name]

## 2019-11-22 NOTE — H&P (View-Only) (Signed)
Cardiology Office Note   Date:  12/04/2019   ID:  Brent, Dougherty 26-Jul-1950, MRN [id number]  PCP:  [name], MD  Cardiologist:   [name], MD   No chief complaint on file.     History of Present Illness:  70 y.o. first seen December 2018 for new onset afib. Had poorly controlled HTN Diet with lots of caffeine and Mountain Dew ObeseWith OSA wears CPAP. CHAdVASC Score 2.   He was started on xarelto , beta blocker and diuretic   Echo reviewed 01/27/17 EF 55-60% normal atrial sizes   Ultimately had DCC on 03/08/17 Failed to convert. Started on flecainide 06/01/17  ETT f/u normal   Cardioversion repeat posponed due to right TKR revision by [name] done 07/28/17 Complicated by effusion and ? Infection Unable to fully anticoagulate Had to have another surgery 08/16/18 with extensor mechanism rupture Post op had rash ? Due to different batch of cardizem pills   No cardiac symptoms compliant with xarelto  Willing to have repeat [facility] end of January   He is still unhappy about the progress of his knee. Last [facility] his skin got "red" do to repeated Attempts at conversion that were not successful. Discussed the idea of trying again now that he is on AAT  Seen in ER July with left finger laceration caught hand in door   He is recovering from COVID has had negative test was fully vaccinated Did get monoclonal infusion At [facility] Sense of smell / taste still abnormal but breathing back to baseline  Noted afib rates faster during illness Wants to arrange [facility] has not missed any doses of blood thinner   Past Medical History:  Diagnosis Date  . Arthritis    BOTH KNEES  . Asthma    uncomplicated  . Atrial fibrillation (HCC) 01/2017  . Bronchitis    LAST TIME WAS JAN 2014  . Chronic insomnia 11/14/2015  . Chronic left shoulder pain 01/19/2016  . Dysrhythmia   . Edema   . Elevated blood pressure reading   . History of blood transfusion 07/27/2017  . History of  kidney stones   . Morbid obesity (HCC) 11/14/2015  . Obstructive chronic bronchitis without exacerbation (HCC) 03/14/2008  . OSA on CPAP   . Osteoarthritis of knee 11/14/2015  . PONV (postoperative nausea and vomiting)     Past Surgical History:  Procedure Laterality Date  . BILATERAL KNEE ARTHROSCOPY    . CARDIOVERSION N/A 03/08/2017   Procedure: CARDIOVERSION;  Surgeon: [name], MD;  Location: [facility] ENDOSCOPY;  Service: Cardiovascular;  Laterality: N/A;  . I & D KNEE WITH POLY EXCHANGE Right 07/19/2018   Procedure: IRRIGATION AND DEBRIDEMENT KNEE WITH POLY EXCHANGE;  Surgeon: [name], MD;  Location: [facility];  Service: Orthopedics;  Laterality: Right;  . KNEE REPAIR EXTENSOR MECHANISM Right 08/16/2018   Procedure: KNEE REPAIR EXTENSOR MECHANISM;  Surgeon: [name], MD;  Location: [facility];  Service: Orthopedics;  Laterality: Right;   . PARTIAL AMPUTATION LEFT THUMB AND TENDON REPAIR    . TONSILLECTOMY AND ADENOIDECTOMY    . TOTAL KNEE ARTHROPLASTY Right 06/19/2012   Procedure: RIGHT TOTAL KNEE ARTHROPLASTY;  Surgeon: [name], MD;  Location: [facility];  Service: Orthopedics;  Laterality: Right;  . TOTAL KNEE REVISION Right 07/27/2017   Procedure: RIGHT KNEE POLYETHYLENE REVISION;  Surgeon: [name], MD;  Location: [facility];  Service: Orthopedics;  Laterality: Right;  Adductor Block  Current Outpatient Medications  Medication Sig Dispense Refill  . acetaminophen (TYLENOL) 325 MG tablet Take 325 mg by mouth every 6 (six) hours as needed. For surgical knee pain as needed    . diphenhydramine-acetaminophen (TYLENOL PM) 25-500 MG TABS tablet Take 2 tablets by mouth at bedtime as needed (sleep).     . flecainide (TAMBOCOR) 50 MG tablet Take 75 mg by mouth 2 (two) times daily.    . hydrochlorothiazide (MICROZIDE) 12.5 MG capsule Take 1 capsule (12.5 mg total) by mouth 2 (two) times daily. 180 capsule 3  . metoprolol tartrate (LOPRESSOR) 25 MG tablet TAKE 1 TABLET  TWICE A DAY 180 tablet 3  . rivaroxaban (XARELTO) 20 MG TABS tablet TAKE 1 TABLET DAILY WITH SUPPER 90 tablet 1  . zolpidem (AMBIEN) 10 MG tablet 1/2 or 1 tab at bedtime for sleep as needed 30 tablet 5   No current facility-administered medications for this visit.    Allergies:   Morphine and related    Social History:  The patient  reports that he has never smoked. He has never used smokeless tobacco. He reports that he does not drink alcohol and does not use drugs.   Family History:  The patient's family history includes Cancer in his mother; Cancer - Other in his father and paternal grandfather; Diabetes in his father and paternal grandfather; Heart disease in his father and paternal grandfather; Prostate cancer in his paternal grandfather.    ROS:  Please see the history of present illness.   Otherwise, review of systems are positive for none.   All other systems are reviewed and negative.    PHYSICAL EXAM: VS:  BP (!) 144/88   Pulse 79   Ht 6\' 1"  (1.854 m)   Wt (!) 327 lb 9.6 oz (148.6 kg)   SpO2 97%   BMI 43.22 kg/m  , BMI Body mass index is 43.22 kg/m. Affect appropriate Obese male  HEENT: normal Neck supple with no adenopathy JVP normal no bruits no thyromegaly Lungs clear with no wheezing and good diaphragmatic motion Heart:  S1/S2 no murmur, no rub, gallop or click PMI normal Abdomen: benighn, BS positve, no tenderness, no AAA no bruit.  No HSM or HJR Distal pulses intact with no bruits No edema Neuro non-focal Skin warm and dry Post right TKR     EKG:  06/01/17 afib rate 61 otherwise normal   Recent Labs: No results found for requested labs within last 8760 hours.    Lipid Panel No results found for: CHOL, TRIG, HDL, CHOLHDL, VLDL, LDLCALC, LDLDIRECT    Wt Readings from Last 3 Encounters:  12/04/19 (!) 327 lb 9.6 oz (148.6 kg)  11/12/19 (!) 328 lb (148.8 kg)  08/25/19 (!) 325 lb (147.4 kg)      Other studies Reviewed: Additional studies/  records that were reviewed today include: [facility] ECG. Op notes Dr 10/26/19    ASSESSMENT AND PLAN:  1.  Afib:  On beta blocker and xarelto failed DCC on 03/08/17 start flecainide 75 bid ETT done 06/17/17 with no pro arrhythmia or ischemia  [facility] posponed due to right knee surgery done 07/28/17  And 08/16/18  And complications with effusion since then  Have scheduled [facility] for Monday 10/25 Orders written Endoscopy called. Risks including stroke, intubation hypotension and need for pacing discussed willing to proceed.  2. HTN: Well controlled.  Continue current medications and low sodium Dash type diet.     3. Edema: from venous insuficiency  and obesity with some skin breakdown and stasis Continue diuretic  4. Ortho: post right TKR with revision x 2  Last one 08/16/18 Dr Despina Hick still needing Rx had ruptured tendon during PT    5. OSA:  Encouraged him to wear nightly and not " when I need it" Link to PAF discussed f/u Dr Maple Hudson  6. Obesity:  Discussed bariatric surgery. Has lost weight in past with weight watchers.   F/u post [facility]   [name]

## 2019-12-04 ENCOUNTER — Other Ambulatory Visit: Payer: Self-pay

## 2019-12-04 ENCOUNTER — Encounter: Payer: Self-pay | Admitting: Cardiovascular Disease

## 2019-12-04 ENCOUNTER — Ambulatory Visit (INDEPENDENT_AMBULATORY_CARE_PROVIDER_SITE_OTHER): Payer: BC Managed Care – PPO | Admitting: Cardiovascular Disease

## 2019-12-04 VITALS — BP 144/88 | HR 79 | Ht 73.0 in | Wt 327.6 lb

## 2019-12-04 DIAGNOSIS — Z7189 Other specified counseling: Secondary | ICD-10-CM

## 2019-12-04 DIAGNOSIS — I10 Essential (primary) hypertension: Secondary | ICD-10-CM | POA: Diagnosis not present

## 2019-12-04 DIAGNOSIS — I480 Paroxysmal atrial fibrillation: Secondary | ICD-10-CM

## 2019-12-04 NOTE — Patient Instructions (Addendum)
Medication Instructions:  *If you need a refill on your cardiac medications before your next appointment, please call your pharmacy*  Lab Work: Your physician recommends that you have lab work today- BMET and CBC  If you have labs (blood work) drawn today and your tests are completely normal, you will receive your results only by: Marland Kitchen MyChart Message (if you have MyChart) OR . A paper copy in the mail If you have any lab test that is abnormal or we need to change your treatment, we will call you to review the results.  Testing/Procedures: Your physician has recommended that you have a Cardioversion (DCCV). Electrical Cardioversion uses a jolt of electricity to your heart either through paddles or wired patches attached to your chest. This is a controlled, usually prescheduled, procedure. Defibrillation is done under light anesthesia in the hospital, and you usually go home the day of the procedure. This is done to get your heart back into a normal rhythm. You are not awake for the procedure. Please see the instruction sheet given to you today.  Follow-Up: At [facility], you and your health needs are our priority.  As part of our continuing mission to provide you with exceptional heart care, we have created designated Provider Care Teams.  These Care Teams include your primary Cardiologist (physician) and Advanced Practice Providers (APPs -  Physician Assistants and Nurse Practitioners) who all work together to provide you with the care you need, when you need it.  We recommend signing up for the patient portal called "MyChart".  Sign up information is provided on this After Visit Summary.  MyChart is used to connect with patients for Virtual Visits (Telemedicine).  Patients are able to view lab/test results, encounter notes, upcoming appointments, etc.  Non-urgent messages can be sent to your provider as well.   To learn more about what you can do with MyChart, go to [email or web address].     Your next appointment:   2 weeks  The format for your next appointment:   In Person  Provider:   You may see [name], MD or one of the following Advanced Practice Providers on your designated Care Team:    Norma Fredrickson, NP  Nada Boozer, NP  Georgie Chard, NP   You are scheduled for a Cardioversion on 12/10/19 with Dr. Eden Emms.  Please arrive at the [facility] (Main Entrance A) at [facility]: [address] [place], [place] [address] at 7:30 am.  DIET: Nothing to eat or drink after midnight except a sip of water with medications (see medication instructions below)  Medication Instructions:  Continue your anticoagulant: Xarelto You will need to continue your anticoagulant after your procedure until you  are told by your  Provider that it is safe to stop   Labs: Today  Due to recent COVID-19 restrictions implemented by our local and state authorities and in an effort to keep both patients and staff as safe as possible, our hospital system requires COVID-19 testing prior to certain scheduled hospital procedures.  Please go to [address] [place], [place] [address] on 12/08/19 at 10:30 am .  This is a drive up testing site.  You will not need to exit your vehicle.  You will not be billed at the time of testing but may receive a bill later depending on your insurance. You must agree to self-quarantine from the time of your testing until the procedure date on 12/10/19.  This should included staying home with ONLY the people  you live with.  Avoid take-out, grocery store shopping or leaving the house for any non-emergent reason.  Failure to have your COVID-19 test done on the date and time you have been scheduled will result in cancellation of your procedure.  Please call our office at [phone] if you have any questions.     You must have a responsible person to drive you home and stay in the waiting area during your procedure. Failure to do so could result in  cancellation.  Bring your insurance cards.  *Special Note: Every effort is made to have your procedure done on time. Occasionally there are emergencies that occur at the hospital that may cause delays. Please be patient if a delay does occur.

## 2019-12-05 LAB — CBC WITH DIFFERENTIAL/PLATELET
Basophils Absolute: 0 10*3/uL (ref 0.0–0.2)
Basos: 0 %
EOS (ABSOLUTE): 0.1 10*3/uL (ref 0.0–0.4)
Eos: 1 %
Hematocrit: 44 % (ref 37.5–51.0)
Hemoglobin: 15.1 g/dL (ref 13.0–17.7)
Immature Grans (Abs): 0 10*3/uL (ref 0.0–0.1)
Immature Granulocytes: 0 %
Lymphocytes Absolute: 2.8 10*3/uL (ref 0.7–3.1)
Lymphs: 32 %
MCH: 30.5 pg (ref 26.6–33.0)
MCHC: 34.3 g/dL (ref 31.5–35.7)
MCV: 89 fL (ref 79–97)
Monocytes Absolute: 0.7 10*3/uL (ref 0.1–0.9)
Monocytes: 8 %
Neutrophils Absolute: 5 10*3/uL (ref 1.4–7.0)
Neutrophils: 59 %
Platelets: 338 10*3/uL (ref 150–450)
RBC: 4.95 x10E6/uL (ref 4.14–5.80)
RDW: 12.4 % (ref 11.6–15.4)
WBC: 8.5 10*3/uL (ref 3.4–10.8)

## 2019-12-05 LAB — BASIC METABOLIC PANEL
BUN/Creatinine Ratio: 14 (ref 10–24)
BUN: 13 mg/dL (ref 8–27)
CO2: 27 mmol/L (ref 20–29)
Calcium: 9.4 mg/dL (ref 8.6–10.2)
Chloride: 100 mmol/L (ref 96–106)
Creatinine, Ser: 0.95 mg/dL (ref 0.76–1.27)
GFR calc Af Amer: 95 mL/min/{1.73_m2} (ref >59)
GFR calc non Af Amer: 82 mL/min/{1.73_m2} (ref >59)
Glucose: 104 mg/dL — ABNORMAL HIGH (ref 65–99)
Potassium: 4.1 mmol/L (ref 3.5–5.2)
Sodium: 138 mmol/L (ref 134–144)

## 2019-12-06 ENCOUNTER — Other Ambulatory Visit: Payer: Self-pay | Admitting: Internal Medicine

## 2019-12-07 NOTE — Progress Notes (Signed)
Pt not tested for covid on Sat 12/08/19 due to pt testing + for covid on 11/19/19 (results in Care Everywhere). The pt's procedure is scheduled for Mon 10/25 with Dr. Eden Emms at [facility]. Based on the guidelines the pt is in the 90 day window to not retest. The pt is still expected to quarantine until their procedure. Therefore, the pt can still have the scheduled procedure.   These are the guidelines as follows:  Guidance: Patient previously tested + COVID; now past 90 day window seeking elective surgery (asymptomatic)  Retest patient If negative, proceed with surgery If positive, postpone surgery for 10 days from positive test Patient to quarantine for the (10 days) Do not retest again prior to surgery (even if scheduled a couple of weeks out) Use standard precautions for surgery

## 2019-12-08 ENCOUNTER — Inpatient Hospital Stay (HOSPITAL_COMMUNITY)
Admission: RE | Admit: 2019-12-08 | Discharge: 2019-12-08 | Disposition: A | Discharge status: Home / Self Care | Payer: BC Managed Care – PPO | Source: Ambulatory Visit

## 2019-12-10 ENCOUNTER — Encounter (HOSPITAL_COMMUNITY)
Admission: RE | Disposition: A | Discharge status: Home / Self Care | Payer: Self-pay | Source: Home / Self Care | Attending: Cardiovascular Disease

## 2019-12-10 ENCOUNTER — Ambulatory Visit (HOSPITAL_COMMUNITY): Payer: BC Managed Care – PPO | Admitting: Anesthesiology

## 2019-12-10 ENCOUNTER — Ambulatory Visit (HOSPITAL_COMMUNITY)
Admission: RE | Admit: 2019-12-10 | Discharge: 2019-12-10 | Disposition: A | Discharge status: Home / Self Care | Payer: BC Managed Care – PPO | Attending: Cardiovascular Disease | Admitting: Cardiovascular Disease

## 2019-12-10 ENCOUNTER — Telehealth: Payer: Self-pay

## 2019-12-10 ENCOUNTER — Encounter (HOSPITAL_COMMUNITY): Payer: Self-pay | Admitting: Cardiovascular Disease

## 2019-12-10 DIAGNOSIS — Z8616 Personal history of COVID-19: Secondary | ICD-10-CM | POA: Insufficient documentation

## 2019-12-10 DIAGNOSIS — Z885 Allergy status to narcotic agent status: Secondary | ICD-10-CM | POA: Insufficient documentation

## 2019-12-10 DIAGNOSIS — G4733 Obstructive sleep apnea (adult) (pediatric): Secondary | ICD-10-CM | POA: Insufficient documentation

## 2019-12-10 DIAGNOSIS — Z7901 Long term (current) use of anticoagulants: Secondary | ICD-10-CM | POA: Insufficient documentation

## 2019-12-10 DIAGNOSIS — E669 Obesity, unspecified: Secondary | ICD-10-CM | POA: Diagnosis not present

## 2019-12-10 DIAGNOSIS — Z79899 Other long term (current) drug therapy: Secondary | ICD-10-CM | POA: Diagnosis not present

## 2019-12-10 DIAGNOSIS — I480 Paroxysmal atrial fibrillation: Secondary | ICD-10-CM

## 2019-12-10 DIAGNOSIS — I10 Essential (primary) hypertension: Secondary | ICD-10-CM | POA: Diagnosis not present

## 2019-12-10 DIAGNOSIS — R609 Edema, unspecified: Secondary | ICD-10-CM | POA: Insufficient documentation

## 2019-12-10 DIAGNOSIS — I4891 Unspecified atrial fibrillation: Secondary | ICD-10-CM | POA: Diagnosis not present

## 2019-12-10 DIAGNOSIS — Z6841 Body Mass Index (BMI) 40.0 and over, adult: Secondary | ICD-10-CM | POA: Diagnosis not present

## 2019-12-10 HISTORY — PX: CARDIOVERSION: SHX1299

## 2019-12-10 SURGERY — CARDIOVERSION
Anesthesia: General

## 2019-12-10 MED ORDER — SODIUM CHLORIDE 0.9 % IV SOLN: INTRAVENOUS | Status: DC | PRN

## 2019-12-10 MED ORDER — PROPOFOL 10 MG/ML IV BOLUS
INTRAVENOUS | Status: DC | PRN
  Administered 2019-12-10: 20 mg via INTRAVENOUS
  Administered 2019-12-10: 50 mg via INTRAVENOUS
  Administered 2019-12-10 (×2): 30 mg via INTRAVENOUS

## 2019-12-10 NOTE — Anesthesia Procedure Notes (Addendum)
Procedure Name: General with mask airway Date/Time: 12/10/2019 9:00 AM Performed by: Colon Flattery, CRNA Pre-anesthesia Checklist: Patient identified, Emergency Drugs available, Suction available and Patient being monitored Patient Re-evaluated:Patient Re-evaluated prior to induction Oxygen Delivery Method: Ambu bag Preoxygenation: Pre-oxygenation with 100% oxygen Induction Type: IV induction Placement Confirmation: positive ETCO2 Dental Injury: Teeth and Oropharynx as per pre-operative assessment

## 2019-12-10 NOTE — Discharge Instructions (Signed)
Electrical Cardioversion Electrical cardioversion is the delivery of a jolt of electricity to restore a normal rhythm to the heart. A rhythm that is too fast or is not regular keeps the heart from pumping well. In this procedure, sticky patches or metal paddles are placed on the chest to deliver electricity to the heart from a device. This procedure may be done in an emergency if:  There is low or no blood pressure as a result of the heart rhythm.  Normal rhythm must be restored as fast as possible to protect the brain and heart from further damage.  It may save a life. This may also be a scheduled procedure for irregular or fast heart rhythms that are not immediately life-threatening. Tell a health care provider about:  Any allergies you have.  All medicines you are taking, including vitamins, herbs, eye drops, creams, and over-the-counter medicines.  Any problems you or family members have had with anesthetic medicines.  Any blood disorders you have.  Any surgeries you have had.  Any medical conditions you have.  Whether you are pregnant or may be pregnant. What are the risks? Generally, this is a safe procedure. However, problems may occur, including:  Allergic reactions to medicines.  A blood clot that breaks free and travels to other parts of your body.  The possible return of an abnormal heart rhythm within hours or days after the procedure.  Your heart stopping (cardiac arrest). This is rare. What happens before the procedure? Medicines  Your health care provider may have you start taking: ? Blood-thinning medicines (anticoagulants) so your blood does not clot as easily. ? Medicines to help stabilize your heart rate and rhythm.  Ask your health care provider about: ? Changing or stopping your regular medicines. This is especially important if you are taking diabetes medicines or blood thinners. ? Taking medicines such as aspirin and ibuprofen. These medicines can  thin your blood. Do not take these medicines unless your health care provider tells you to take them. ? Taking over-the-counter medicines, vitamins, herbs, and supplements. General instructions  Follow instructions from your health care provider about eating or drinking restrictions.  Plan to have someone take you home from the hospital or clinic.  If you will be going home right after the procedure, plan to have someone with you for 24 hours.  Ask your health care provider what steps will be taken to help prevent infection. These may include washing your skin with a germ-killing soap. What happens during the procedure?   An IV will be inserted into one of your veins.  Sticky patches (electrodes) or metal paddles may be placed on your chest.  You will be given a medicine to help you relax (sedative).  An electrical shock will be delivered. The procedure may vary among health care providers and hospitals. What can I expect after the procedure?  Your blood pressure, heart rate, breathing rate, and blood oxygen level will be monitored until you leave the hospital or clinic.  Your heart rhythm will be watched to make sure it does not change.  You may have some redness on the skin where the shocks were given. Follow these instructions at home:  Do not drive for 24 hours if you were given a sedative during your procedure.  Take over-the-counter and prescription medicines only as told by your health care provider.  Ask your health care provider how to check your pulse. Check it often.  Rest for 48 hours after the procedure or   as told by your health care provider.  Avoid or limit your caffeine use as told by your health care provider.  Keep all follow-up visits as told by your health care provider. This is important. Contact a health care provider if:  You feel like your heart is beating too quickly or your pulse is not regular.  You have a serious muscle cramp that does not go  away. Get help right away if:  You have discomfort in your chest.  You are dizzy or you feel faint.  You have trouble breathing or you are short of breath.  Your speech is slurred.  You have trouble moving an arm or leg on one side of your body.  Your fingers or toes turn cold or blue. Summary  Electrical cardioversion is the delivery of a jolt of electricity to restore a normal rhythm to the heart.  This procedure may be done right away in an emergency or may be a scheduled procedure if the condition is not an emergency.  Generally, this is a safe procedure.  After the procedure, check your pulse often as told by your health care provider. This information is not intended to replace advice given to you by your health care provider. Make sure you discuss any questions you have with your health care provider. Document Revised: 09/04/2018 Document Reviewed: 09/04/2018 Elsevier Patient Education  2020 Elsevier Inc.  

## 2019-12-10 NOTE — Telephone Encounter (Signed)
-----   Message from [name], MD sent at 12/10/2019  9:47 AM EDT ----- Failed [facility] on flecainide please set up ASAP with [facility] to consider afib ablation vs alternative AAT

## 2019-12-10 NOTE — Transfer of Care (Signed)
Immediate Anesthesia Transfer of Care Note  Patient: Brent Dougherty  Procedure(s) Performed: CARDIOVERSION (N/A )  Patient Location: Endoscopy Unit  Anesthesia Type:General  Level of Consciousness: awake, alert  and oriented  Airway & Oxygen Therapy: Patient Spontanous Breathing  Post-op Assessment: Report given to RN, Post -op Vital signs reviewed and stable and Patient moving all extremities  Post vital signs: Reviewed and stable  Last Vitals:  Vitals Value Taken Time  BP 112/72 12/10/19 0901  Temp 36.5 C 12/10/19 0851  Pulse 68 12/10/19 0906  Resp 15 12/10/19 0906  SpO2 97 % 12/10/19 0906  Vitals shown include unvalidated device data.  Last Pain:  Vitals:   12/10/19 0901  TempSrc:   PainSc: 0-No pain         Complications: No complications documented.

## 2019-12-10 NOTE — Telephone Encounter (Signed)
Placed referral for Dr. Johney Frame. Will see if his scheduler can get patient in as soon as possible.

## 2019-12-10 NOTE — Interval H&P Note (Signed)
History and Physical Interval Note:  12/10/2019 8:06 AM  Brent Dougherty  has presented today for surgery, with the diagnosis of Atrial Fibrillation.  The various methods of treatment have been discussed with the patient and family. After consideration of risks, benefits and other options for treatment, the patient has consented to  Procedure(s): CARDIOVERSION (N/A) as a surgical intervention.  The patient's history has been reviewed, patient examined, no change in status, stable for surgery.  I have reviewed the patient's chart and labs.  Questions were answered to the patient's satisfaction.     [name]

## 2019-12-10 NOTE — Anesthesia Preprocedure Evaluation (Signed)
Anesthesia Evaluation  Patient identified by MRN, date of birth, ID band Patient awake    Reviewed: Allergy & Precautions, NPO status , Patient's Chart, lab work & pertinent test results, reviewed documented beta blocker date and time   History of Anesthesia Complications (+) PONV and history of anesthetic complications  Airway Mallampati: III  TM Distance: >3 FB Neck ROM: Full    Dental  (+) Dental Advisory Given, Teeth Intact   Pulmonary neg shortness of breath, asthma , sleep apnea and Continuous Positive Airway Pressure Ventilation , neg recent URI,  Covid-19 Nucleic Acid Test Results Lab Results      Component                Value               Date                      SARSCOV2NAA              NEGATIVE            08/14/2018                SARSCOV2NAA              NOT DETECTED        07/17/2018              breath sounds clear to auscultation       Cardiovascular + dysrhythmias Atrial Fibrillation  Rhythm:Irregular     Neuro/Psych negative neurological ROS     GI/Hepatic negative GI ROS, Neg liver ROS,   Endo/Other  Morbid obesity  Renal/GU negative Renal ROSLab Results      Component                Value               Date                      CREATININE               0.95                12/04/2019                Musculoskeletal  (+) Arthritis ,   Abdominal   Peds  Hematology xarelto for afib   Anesthesia Other Findings Left ventricle: The cavity size was normal. Wall thickness was  normal. Systolic function was normal. The estimated ejection  fraction was in the range of 55% to 60%. Wall motion was normal;  there were no regional wall motion abnormalities.  - Aortic valve: Mildly calcified annulus.   Reproductive/Obstetrics                             Anesthesia Physical Anesthesia Plan  ASA: III  Anesthesia Plan: General   Post-op Pain Management:    Induction:  Intravenous  PONV Risk Score and Plan: 3 and Treatment may vary due to age or medical condition  Airway Management Planned: Mask  Additional Equipment: None  Intra-op Plan:   Post-operative Plan:   Informed Consent: I have reviewed the patients History and Physical, chart, labs and discussed the procedure including the risks, benefits and alternatives for the proposed anesthesia with the patient or authorized representative who has indicated his/her understanding and acceptance.     Dental advisory given  Plan  Discussed with: CRNA and Surgeon  Anesthesia Plan Comments:         Anesthesia Quick Evaluation

## 2019-12-10 NOTE — CV Procedure (Signed)
DCC x 3 200J biphasic with AP compression No conversion and not a single sinus beat even with longish pauses  Patient will continue xarelto F/U with EP to consider alternative AAT or ablation  Will stop flecainide since it was not helpful and would Need to wash out before starting another AAT  [name] MD [facility]

## 2019-12-11 ENCOUNTER — Encounter (HOSPITAL_COMMUNITY): Payer: Self-pay | Admitting: Cardiovascular Disease

## 2019-12-12 NOTE — Anesthesia Postprocedure Evaluation (Signed)
Anesthesia Post Note  Patient: Brent Dougherty  Procedure(s) Performed: CARDIOVERSION (N/A )     Patient location during evaluation: Endoscopy Anesthesia Type: General Level of consciousness: awake and alert Pain management: pain level controlled Vital Signs Assessment: post-procedure vital signs reviewed and stable Respiratory status: spontaneous breathing, nonlabored ventilation, respiratory function stable and patient connected to nasal cannula oxygen Cardiovascular status: blood pressure returned to baseline and stable Postop Assessment: no apparent nausea or vomiting Anesthetic complications: no   No complications documented.  Last Vitals:  Vitals:   12/10/19 0901 12/10/19 0910  BP: 112/72 134/82  Pulse: (!) 53 71  Resp: 15 15  Temp:    SpO2: 97% 96%    Last Pain:  Vitals:   12/11/19 1226  TempSrc:   PainSc: 0-No pain                 Sieanna Vanstone

## 2019-12-17 ENCOUNTER — Other Ambulatory Visit: Payer: Self-pay | Admitting: Cardiovascular Disease

## 2019-12-21 ENCOUNTER — Ambulatory Visit: Payer: BC Managed Care – PPO | Admitting: Cardiology

## 2019-12-28 NOTE — Telephone Encounter (Signed)
Per EP scheduler patient is not wanting to see EP right now, and would like to think about it for now. Patient told her that maybe he would try to see them in the beginning of the year.

## 2020-01-03 ENCOUNTER — Encounter: Payer: Self-pay | Admitting: Internal Medicine

## 2020-03-12 ENCOUNTER — Other Ambulatory Visit: Payer: Self-pay | Admitting: Cardiovascular Disease

## 2020-05-13 ENCOUNTER — Other Ambulatory Visit: Payer: Self-pay | Admitting: Internal Medicine

## 2020-05-14 NOTE — Telephone Encounter (Signed)
Ambien refilled

## 2020-07-03 ENCOUNTER — Telehealth: Payer: Self-pay

## 2020-07-03 NOTE — Telephone Encounter (Signed)
Prior Authorization for Xarelto 20 mg tablets approved through Cover My Meds.    YIFOYD:[id number] Status:Approved Review Type:Prior Auth Coverage Start Date:06/03/2020 Coverage End Date:07/03/2021

## 2020-08-04 ENCOUNTER — Telehealth: Payer: Self-pay | Admitting: Internal Medicine

## 2020-08-04 DIAGNOSIS — G4733 Obstructive sleep apnea (adult) (pediatric): Secondary | ICD-10-CM

## 2020-08-04 NOTE — Telephone Encounter (Signed)
Please order DME [facility]- Please replace CPAP mask of choice, headgear and supplies

## 2020-08-04 NOTE — Telephone Encounter (Signed)
Called and spoke with Patient.  Patient stated he is needing cpap supplies order sent to [facility]. Patient is needing a new headband for his airfit P-10 pillow nasal system. Patient stated it is ok to leave message on VM for any call backs to him.   Message routed to Dr. Maple Hudson to advise

## 2020-08-04 NOTE — Telephone Encounter (Signed)
Order has been sent to [facility]. Attempted to call pt but unable to reach. Left pt a detailed message letting him know that we had sent order to [facility] for him to receive new cpap mask of choice, headgear, and other necessary supplies. Nothing further needed.

## 2020-08-04 NOTE — Telephone Encounter (Signed)
Pt stated that he is requesting that an order be placed for new CPAP equipment stated that elastic on his head strap has given out for him and he stated that it falls off of his face. Pls regard; [phone]

## 2020-09-23 ENCOUNTER — Other Ambulatory Visit (HOSPITAL_COMMUNITY): Payer: Self-pay

## 2020-09-23 ENCOUNTER — Telehealth: Payer: Self-pay | Admitting: Cardiovascular Disease

## 2020-09-23 MED ORDER — RIVAROXABAN 20 MG PO TABS
ORAL_TABLET | ORAL | 1 refills | Status: DC
  Filled 2020-09-23: qty 90, 90d supply, fill #0
  Filled 2020-12-23: qty 90, 90d supply, fill #1

## 2020-09-23 NOTE — Telephone Encounter (Addendum)
Prescription refill request for Xarelto received.  Indication: Afib Last office visit: 12/04/2019 [facility]) Weight: 148.6kg Age: 70 Scr: 0.86 (12/04/2019) CrCl: 172.78ml/min  Appropriate dose and refill sent to requested pharmacy.

## 2020-09-23 NOTE — Telephone Encounter (Signed)
*  STAT* If patient is at the pharmacy, call can be transferred to refill team.   1. Which medications need to be refilled? (please list name of each medication and dose if known) rivaroxaban (XARELTO) 20 MG TABS tablet  2. Which pharmacy/location (including street and city if local pharmacy) is medication to be sent to? [facility]  3. Do they need a 30 day or 90 day supply? 90 day  Patient's wife calling to update the pharmacy. She states to remove [facility] and he will now get refills to to [facility].

## 2020-09-24 ENCOUNTER — Other Ambulatory Visit (HOSPITAL_COMMUNITY): Payer: Self-pay

## 2020-09-24 MED ORDER — RIVAROXABAN 20 MG PO TABS: 20.0000 mg | ORAL_TABLET | Freq: Every day | ORAL | 0 refills | Status: DC

## 2020-09-30 ENCOUNTER — Other Ambulatory Visit (HOSPITAL_COMMUNITY): Payer: Self-pay

## 2020-10-07 DIAGNOSIS — H52223 Regular astigmatism, bilateral: Secondary | ICD-10-CM | POA: Diagnosis not present

## 2020-11-10 ENCOUNTER — Ambulatory Visit: Payer: Self-pay | Admitting: Internal Medicine

## 2020-12-23 ENCOUNTER — Other Ambulatory Visit (HOSPITAL_COMMUNITY): Payer: Self-pay

## 2021-02-05 DIAGNOSIS — Z0000 Encounter for general adult medical examination without abnormal findings: Secondary | ICD-10-CM | POA: Diagnosis not present

## 2021-02-05 DIAGNOSIS — Z13220 Encounter for screening for lipoid disorders: Secondary | ICD-10-CM | POA: Diagnosis not present

## 2021-02-05 DIAGNOSIS — Z1329 Encounter for screening for other suspected endocrine disorder: Secondary | ICD-10-CM | POA: Diagnosis not present

## 2021-02-05 DIAGNOSIS — Z125 Encounter for screening for malignant neoplasm of prostate: Secondary | ICD-10-CM | POA: Diagnosis not present

## 2021-02-10 DIAGNOSIS — N5203 Combined arterial insufficiency and corporo-venous occlusive erectile dysfunction: Secondary | ICD-10-CM | POA: Diagnosis not present

## 2021-02-10 DIAGNOSIS — Z6839 Body mass index (BMI) 39.0-39.9, adult: Secondary | ICD-10-CM | POA: Diagnosis not present

## 2021-02-10 DIAGNOSIS — Z0000 Encounter for general adult medical examination without abnormal findings: Secondary | ICD-10-CM | POA: Diagnosis not present

## 2021-02-10 DIAGNOSIS — G4733 Obstructive sleep apnea (adult) (pediatric): Secondary | ICD-10-CM | POA: Diagnosis not present

## 2021-02-10 DIAGNOSIS — M1711 Unilateral primary osteoarthritis, right knee: Secondary | ICD-10-CM | POA: Diagnosis not present

## 2021-02-10 DIAGNOSIS — I4819 Other persistent atrial fibrillation: Secondary | ICD-10-CM | POA: Diagnosis not present

## 2021-02-24 NOTE — Progress Notes (Signed)
Cardiology Office Note   Date:  03/06/2021   ID:  Brent Dougherty, Brent Dougherty 18-Feb-1950, MRN [id number]  PCP:  [name], MD  Cardiologist:   [name], MD   No chief complaint on file.     History of Present Illness:  71 y.o. first seen December 2018 for new onset afib. Had poorly controlled HTN Diet with lots of caffeine and Mountain Dew ObeseWith OSA wears CPAP. CHAdVASC Score 2.   He was started on xarelto , beta blocker and diuretic   Echo reviewed 01/27/17 EF 55-60% normal atrial sizes   Ultimately had DCC on 03/08/17 Failed to convert. Started on flecainide 06/01/17  ETT f/u normal   Cardioversion repeat posponed due to right TKR revision by [name] done 07/28/17 Complicated by effusion and ? Infection Unable to fully anticoagulate Had to have another surgery 08/16/18 with extensor mechanism rupture Post op had rash ? Due to different batch of cardizem pills   Had repeat [facility] 12/10/19 failed on flecainide which was then d/c Referred to EP for alternative AAT or ablation but patient did not f/u and has not been seen in our office since   He uses his CPAP.  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, water with pears and other fruits Has two grown children and grand children   Willing to see EP to talk about conversion options   Past Medical History:  Diagnosis Date   Arthritis    BOTH KNEES   Asthma    uncomplicated   Atrial fibrillation (HCC) 01/2017   Bronchitis    LAST TIME WAS JAN 2014   Chronic insomnia 11/14/2015   Chronic left shoulder pain 01/19/2016   Dysrhythmia    Edema    Elevated blood pressure reading    History of blood transfusion 07/27/2017   History of kidney stones    Morbid obesity (HCC) 11/14/2015   Obstructive chronic bronchitis without exacerbation (HCC) 03/14/2008   OSA on CPAP    Osteoarthritis of knee 11/14/2015   PONV (postoperative nausea and vomiting)     Past Surgical History:  Procedure Laterality  Date   BILATERAL KNEE ARTHROSCOPY     CARDIOVERSION N/A 03/08/2017   Procedure: CARDIOVERSION;  Surgeon: [name], MD;  Location: [facility] ENDOSCOPY;  Service: Cardiovascular;  Laterality: N/A;   CARDIOVERSION N/A 12/10/2019   Procedure: CARDIOVERSION;  Surgeon: [name], MD;  Location: [facility] ENDOSCOPY;  Service: Cardiovascular;  Laterality: N/A;   I & D KNEE WITH POLY EXCHANGE Right 07/19/2018   Procedure: IRRIGATION AND DEBRIDEMENT KNEE WITH POLY EXCHANGE;  Surgeon: [name], MD;  Location: [facility] ORS;  Service: Orthopedics;  Laterality: Right;   KNEE REPAIR EXTENSOR MECHANISM Right 08/16/2018   Procedure: KNEE REPAIR EXTENSOR MECHANISM;  Surgeon: [name], MD;  Location: [facility] ORS;  Service: Orthopedics;  Laterality: Right;    PARTIAL AMPUTATION LEFT THUMB AND TENDON REPAIR     TONSILLECTOMY AND ADENOIDECTOMY     TOTAL KNEE ARTHROPLASTY Right 06/19/2012   Procedure: RIGHT TOTAL KNEE ARTHROPLASTY;  Surgeon: [name], MD;  Location: [facility] ORS;  Service: Orthopedics;  Laterality: Right;   TOTAL KNEE REVISION Right 07/27/2017   Procedure: RIGHT KNEE POLYETHYLENE REVISION;  Surgeon: [name], MD;  Location: [facility] ORS;  Service: Orthopedics;  Laterality: Right;  Adductor Block     Current Outpatient Medications  Medication Sig Dispense Refill   acetaminophen (TYLENOL) 325 MG tablet Take 325 mg by mouth every 6 (  six) hours as needed. For surgical knee pain as needed     diphenhydramine-acetaminophen (TYLENOL PM) 25-500 MG TABS tablet Take 2 tablets by mouth at bedtime as needed (sleep).      hydrochlorothiazide (MICROZIDE) 12.5 MG capsule TAKE 1 CAPSULE TWICE A DAY 180 capsule 2   metoprolol tartrate (LOPRESSOR) 25 MG tablet TAKE 1 TABLET TWICE A DAY 180 tablet 3   rivaroxaban (XARELTO) 20 MG TABS tablet TAKE 1 TABLET DAILY WITH SUPPER 90 tablet 1   zolpidem (AMBIEN) 10 MG tablet TAKE 1/2-1 TABLET BY MOUTH AT BEDTIME AS NEEDED FOR SLEEP (Patient not taking: Reported on 03/06/2021)  30 tablet 2   No current facility-administered medications for this visit.    Allergies:   Morphine and related    Social History:  The patient  reports that he has never smoked. He has never used smokeless tobacco. He reports that he does not drink alcohol and does not use drugs.   Family History:  The patient's family history includes Cancer in his mother; Cancer - Other in his father and paternal grandfather; Diabetes in his father and paternal grandfather; Heart disease in his father and paternal grandfather; Prostate cancer in his paternal grandfather.    ROS:  Please see the history of present illness.   Otherwise, review of systems are positive for none.   All other systems are reviewed and negative.    PHYSICAL EXAM:  VS:  BP 122/78    Pulse 82    Ht 6\' 1"  (1.854 m)    Wt 295 lb (133.8 kg)    SpO2 98%    BMI 38.92 kg/m  , BMI Body mass index is 38.92 kg/m. Affect appropriate Obese male  HEENT: normal Neck supple with no adenopathy JVP normal no bruits no thyromegaly Lungs clear with no wheezing and good diaphragmatic motion Heart:  S1/S2 no murmur, no rub, gallop or click PMI normal Abdomen: benighn, BS positve, no tenderness, no AAA no bruit.  No HSM or HJR Distal pulses intact with no bruits No edema Neuro non-focal Skin warm and dry Post right TKR     EKG:  06/01/17 afib rate 61 otherwise normal 03/06/2021 afib rae 82 inferior T wave inversion  Recent Labs: No results found for requested labs within last 8760 hours.    Lipid Panel No results found for: CHOL, TRIG, HDL, CHOLHDL, VLDL, LDLCALC, LDLDIRECT    Wt Readings from Last 3 Encounters:  03/06/21 295 lb (133.8 kg)  12/10/19 (!) 327 lb 9.6 oz (148.6 kg)  12/04/19 (!) 327 lb 9.6 oz (148.6 kg)      Other studies Reviewed: Additional studies/ records that were reviewed today include: notes [facility] labs ECG. Op notes Dr [name]    ASSESSMENT AND PLAN:  1.  Afib:  [facility] and  [facility] again on flecainide on 12/10/19 Failed to f/u with EP for alternative AAT/ablation Will try to remake appointment  2. HTN: Well controlled.  Continue current medications and low sodium Dash type diet.     3. Edema: from venous insuficiency and obesity with some skin breakdown and stasis Continue diuretic  4. Ortho: post right TKR with revision x 3  Last one 08/16/18 Dr [name] still needing Rx had ruptured tendon during PT    5. OSA: compliant with CPAP weight still over 300 lbs  6. Obesity:  Discussed bariatric surgery. Has lost weight in past with weight watchers.   F/U EP F/U  with me in a year    [name]

## 2021-03-06 ENCOUNTER — Ambulatory Visit (INDEPENDENT_AMBULATORY_CARE_PROVIDER_SITE_OTHER): Payer: 59 | Admitting: Cardiovascular Disease

## 2021-03-06 ENCOUNTER — Other Ambulatory Visit (HOSPITAL_COMMUNITY): Payer: Self-pay

## 2021-03-06 ENCOUNTER — Encounter: Payer: Self-pay | Admitting: Cardiovascular Disease

## 2021-03-06 ENCOUNTER — Other Ambulatory Visit: Payer: Self-pay

## 2021-03-06 VITALS — BP 122/78 | HR 82 | Ht 73.0 in | Wt 295.0 lb

## 2021-03-06 DIAGNOSIS — I480 Paroxysmal atrial fibrillation: Secondary | ICD-10-CM | POA: Diagnosis not present

## 2021-03-06 DIAGNOSIS — I10 Essential (primary) hypertension: Secondary | ICD-10-CM

## 2021-03-06 MED ORDER — METOPROLOL TARTRATE 25 MG PO TABS
25.0000 mg | ORAL_TABLET | Freq: Two times a day (BID) | ORAL | 3 refills | Status: AC
Start: 2021-03-06 — End: ?
  Filled 2021-03-06: qty 180, 90d supply, fill #0
  Filled 2021-06-08: qty 180, 90d supply, fill #1
  Filled 2021-09-15: qty 180, 90d supply, fill #2
  Filled 2021-12-14: qty 164, 82d supply, fill #3
  Filled 2021-12-14: qty 16, 8d supply, fill #3

## 2021-03-06 MED ORDER — RIVAROXABAN 20 MG PO TABS
ORAL_TABLET | ORAL | 3 refills | Status: AC
  Filled 2021-03-06: qty 90, 90d supply, fill #0
  Filled 2021-06-08: qty 90, 90d supply, fill #1
  Filled 2021-09-15: qty 90, 90d supply, fill #2
  Filled 2021-12-14: qty 90, 90d supply, fill #3

## 2021-03-06 MED ORDER — HYDROCHLOROTHIAZIDE 12.5 MG PO CAPS
12.5000 mg | ORAL_CAPSULE | Freq: Two times a day (BID) | ORAL | 2 refills | Status: DC
  Filled 2021-03-06: qty 180, 90d supply, fill #0
  Filled 2021-06-08: qty 180, 90d supply, fill #1
  Filled 2021-09-15: qty 180, 90d supply, fill #2

## 2021-03-06 NOTE — Patient Instructions (Addendum)
Medication Instructions:  NO CHANGES *If you need a refill on your cardiac medications before your next appointment, please call your pharmacy*   Lab Work: NONE If you have labs (blood work) drawn today and your tests are completely normal, you will receive your results only by: MyChart Message (if you have MyChart) OR A paper copy in the mail If you have any lab test that is abnormal or we need to change your treatment, we will call you to review the results.   Testing/Procedures: NONE   Follow-Up: At [facility], you and your health needs are our priority.  As part of our continuing mission to provide you with exceptional heart care, we have created designated Provider Care Teams.  These Care Teams include your primary Cardiologist (physician) and Advanced Practice Providers (APPs -  Physician Assistants and Nurse Practitioners) who all work together to provide you with the care you need, when you need it.  We recommend signing up for the patient portal called "MyChart".  Sign up information is provided on this After Visit Summary.  MyChart is used to connect with patients for Virtual Visits (Telemedicine).  Patients are able to view lab/test results, encounter notes, upcoming appointments, etc.  Non-urgent messages can be sent to your provider as well.   To learn more about what you can do with MyChart, go to [email or web address].    Your next appointment:   12 month(s)  The format for your next appointment:   In Person  Provider:   [name], MD     Other Instructions EP REFERRAL FOR AFIB

## 2021-03-10 ENCOUNTER — Other Ambulatory Visit (HOSPITAL_COMMUNITY): Payer: Self-pay

## 2021-04-13 ENCOUNTER — Ambulatory Visit (INDEPENDENT_AMBULATORY_CARE_PROVIDER_SITE_OTHER): Payer: 59 | Admitting: Cardiology

## 2021-04-13 ENCOUNTER — Encounter: Payer: Self-pay | Admitting: Cardiology

## 2021-04-13 ENCOUNTER — Other Ambulatory Visit: Payer: Self-pay

## 2021-04-13 VITALS — BP 124/68 | HR 72 | Ht 73.0 in | Wt 288.4 lb

## 2021-04-13 DIAGNOSIS — I4821 Permanent atrial fibrillation: Secondary | ICD-10-CM

## 2021-04-13 NOTE — Progress Notes (Signed)
Electrophysiology Office Note:    Date:  04/13/2021   ID:  Brent Dougherty, Brent Dougherty, Brent Dougherty, MRN [id number]  PCP:  Brent Sacramento, MD  [facility] Cardiologist:  Brent Rouge, MD  [place] Electrophysiologist:  None   Referring MD: Brent Hector, MD   Chief Complaint: Atrial fibrillation  History of Present Illness:    Brent Dougherty is a 71 y.o. male who presents for discussion of AAT and possible ablation at the request of Dr. Johnsie Cancel. Their medical history includes atrial fibrillation, obstructive chronic bronchitis, OSA on CPAP, asthma, arthritis, nephrolithiasis, and morbid obesity.  Last DCCV attempt was 12/10/19, failed on flecainide. Previous unsuccessful attempt on 03/08/2017. Since then the patient was lost to follow-up.  He saw Dr. Johnsie Cancel 03/06/2021. His EKG at that visit showed Afib, rate 82 bpm, inferior T wave inversion. He was referred to EP to discuss AAT/ablation options.  Today he is accompanied by a family member. Generally he is unable to tell if he is in atrial fibrillation unless he checks his pulse.   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 He feels more physically limited by his right knee pain. He has had 4 previous procedures on his right knee, initially a right total knee arthroplasty in 2014. He ambulates with a cane for stability.  He has successfully lost 45 lbs with healthy dietary changes.   He denies any palpitations, chest pain, shortness of breath, or peripheral edema. No lightheadedness, headaches, syncope, orthopnea, or PND.     Past Medical History:  Diagnosis Date   Arthritis    BOTH KNEES   Asthma    uncomplicated   Atrial fibrillation ([place]) 01/2017   Bronchitis    LAST TIME WAS JAN 2014   Chronic insomnia 11/14/2015   Chronic left shoulder pain 01/19/2016   Dysrhythmia    Edema    Elevated blood pressure reading    History of blood transfusion 07/27/2017   History of kidney stones    Morbid  obesity ([place]) 11/14/2015   Obstructive chronic bronchitis without exacerbation ([place]) 03/14/2008   OSA on CPAP    Osteoarthritis of knee 11/14/2015   PONV (postoperative nausea and vomiting)     Past Surgical History:  Procedure Laterality Date   BILATERAL KNEE ARTHROSCOPY     CARDIOVERSION N/A 03/08/2017   Procedure: CARDIOVERSION;  Surgeon: Brent Hector, MD;  Location: [place];  Service: Cardiovascular;  Laterality: N/A;   CARDIOVERSION N/A 12/10/2019   Procedure: CARDIOVERSION;  Surgeon: Brent Hector, MD;  Location: [place];  Service: Cardiovascular;  Laterality: N/A;   I & D KNEE WITH POLY EXCHANGE Right 07/19/2018   Procedure: IRRIGATION AND DEBRIDEMENT KNEE WITH POLY EXCHANGE;  Surgeon: Gaynelle Arabian, MD;  [facility];  Service: Orthopedics;  Laterality: Right;   KNEE REPAIR EXTENSOR MECHANISM Right 08/16/2018   Procedure: KNEE REPAIR EXTENSOR MECHANISM;  Surgeon: Gaynelle Arabian, MD;  [facility];  Service: Orthopedics;  Laterality: Right;  5min   PARTIAL AMPUTATION LEFT THUMB AND TENDON REPAIR     TONSILLECTOMY AND ADENOIDECTOMY     TOTAL KNEE ARTHROPLASTY Right 06/19/2012   Procedure: RIGHT TOTAL KNEE ARTHROPLASTY;  Surgeon: Gearlean Alf, MD;  [facility];  Service: Orthopedics;  Laterality: Right;   TOTAL KNEE REVISION Right 07/27/2017   Procedure: RIGHT KNEE POLYETHYLENE REVISION;  Surgeon: Gaynelle Arabian, MD;  [facility];  Service: Orthopedics;  Laterality: Right;  Adductor Block    Current Medications:  Current Meds  Medication Sig   acetaminophen (TYLENOL) 325 MG tablet Take 325 mg by mouth every 6 (six) hours as needed. For surgical knee pain as needed   diphenhydramine-acetaminophen (TYLENOL PM) 25-500 MG TABS tablet Take 2 tablets by mouth at bedtime as needed (sleep).    hydrochlorothiazide (MICROZIDE) 12.5 MG capsule Take 1 capsule (12.5 mg total) by mouth 2 (two) times daily.   metoprolol tartrate (LOPRESSOR) 25 MG tablet Take 1  tablet (25 mg total) by mouth 2 (two) times daily.   rivaroxaban (XARELTO) 20 MG TABS tablet TAKE 1 TABLET DAILY WITH SUPPER     Allergies:   Morphine and related   Social History   Socioeconomic History   Marital status: Married    Spouse name: Not on file   Number of children: Not on file   Years of education: Not on file   Highest education level: Not on file  Occupational History   Occupation: Environmental consultant  Tobacco Use   Smoking status: Never   Smokeless tobacco: Never  Vaping Use   Vaping Use: Never used  Substance and Sexual Activity   Alcohol use: No   Drug use: No   Sexual activity: Not on file  Other Topics Concern   Not on file  Social History Narrative   Not on file   Social Determinants of Health   Financial Resource Strain: Not on file  Food Insecurity: Not on file  Transportation Needs: Not on file  Physical Activity: Not on file  Stress: Not on file  Social Connections: Not on file     Family History: The patient's family history includes Cancer in his mother; Cancer - Other in his father and paternal grandfather; Diabetes in his father and paternal grandfather; Heart disease in his father and paternal grandfather; Prostate cancer in his paternal grandfather. There is no history of Colon cancer, Colon polyps, Esophageal cancer, Rectal cancer, or Stomach cancer.  ROS:   Please see the history of present illness.    (+) Right knee pain All other systems reviewed and are negative.  EKGs/Labs/Other Studies Reviewed:    The following studies were reviewed today:  ETT 06/17/2017: Blood pressure demonstrated a hypertensive response to exercise. There was no ST segment deviation noted during stress. Exercise capacity moderately impaired. No ischemic changes noted. Target heart rate nearly achieved (81%).   Echo 01/27/2017: Study Conclusions   - Left ventricle: The cavity size was normal. Wall thickness was    normal. Systolic function was  normal. The estimated ejection    fraction was in the range of 55% to 60%. Wall motion was normal;    there were no regional wall motion abnormalities.  - Aortic valve: Mildly calcified annulus.   Impressions:   - technically difficult echo with generally poor image quality.    Definity contrast was used to assess the LV function .   EKG:   EKG is personally reviewed.     Recent Labs: No results found for requested labs within last 8760 hours.   Recent Lipid Panel No results found for: CHOL, TRIG, HDL, CHOLHDL, VLDL, LDLCALC, LDLDIRECT  Physical Exam:    VS:  BP 124/68    Pulse 72    Ht 6\' 1"  (1.854 m)    Wt 288 lb 6.4 oz (130.8 kg)    SpO2 96%    BMI 38.05 kg/m     Wt Readings from Last 3 Encounters:  04/13/21 288 lb 6.4 oz (130.8 kg)  03/06/21  295 lb (133.8 kg)  12/10/19 (!) 327 lb 9.6 oz (148.6 kg)     GEN: Well nourished, well developed in no acute distress.  Obese HEENT: Normal NECK: No JVD; No carotid bruits LYMPHATICS: No lymphadenopathy CARDIAC: RRR, no murmurs, rubs, gallops RESPIRATORY:  Clear to auscultation without rales, wheezing or rhonchi  ABDOMEN: Soft, non-tender, non-distended MUSCULOSKELETAL:  No edema; No deformity  SKIN: Warm and dry, LLE skin discoloration due to chronic venous insufficiency. NEUROLOGIC:  Alert and oriented x 3 PSYCHIATRIC:  Normal affect       ASSESSMENT:    1. Permanent atrial fibrillation (HCC)    PLAN:    In order of problems listed above:  #Permanent atrial fibrillation Asymptomatic.  Highly active man.  Left ventricular function normal.  Has been in atrial fibrillation for multiple years.  Failed flecainide.  I discussed the pathophysiology of permanent atrial fibrillation in detail with the patient and his wife during today's clinic appointment.  I do not think repeated attempts at rhythm control are justified.  I would recommend pursuing a rate control strategy.  He should continue Xarelto for stroke prophylaxis. I  will order a repeat echo today just to confirm his left ventricular function has remained stable despite his atrial fibrillation.  Follow-up with me on an as-needed basis.  Medication Adjustments/Labs and Tests Ordered: Current medicines are reviewed at length with the patient today.  Concerns regarding medicines are outlined above.  Orders Placed This Encounter  Procedures   ECHOCARDIOGRAM COMPLETE   No orders of the defined types were placed in this encounter.   I,Mathew Stumpf,acting as a Education administrator for Vickie Epley, MD.,have documented all relevant documentation on the behalf of Vickie Epley, MD,as directed by  Vickie Epley, MD while in the presence of Vickie Epley, MD  I, Vickie Epley, MD, have reviewed all documentation for this visit. The documentation on 04/13/21 for the exam, diagnosis, procedures, and orders are all accurate and complete.   Signed, Hilton Cork. Quentin Ore, MD, [facility], [facility] 04/13/2021 10:38 PM    Electrophysiology [facility]

## 2021-04-13 NOTE — Patient Instructions (Addendum)
Medication Instructions:  Your physician recommends that you continue on your current medications as directed. Please refer to the Current Medication list given to you today. *If you need a refill on your cardiac medications before your next appointment, please call your pharmacy*  Lab Work: None. If you have labs (blood work) drawn today and your tests are completely normal, you will receive your results only by: Reader (if you have MyChart) OR A paper copy in the mail If you have any lab test that is abnormal or we need to change your treatment, we will call you to review the results.  Testing/Procedures: Your physician has requested that you have an echocardiogram. Echocardiography is a painless test that uses sound waves to create images of your heart. It provides your doctor with information about the size and shape of your heart and how well your hearts chambers and valves are working. This procedure takes approximately one hour. There are no restrictions for this procedure.   Follow-Up: At [facility], you and your health needs are our priority.  As part of our continuing mission to provide you with exceptional heart care, we have created designated Provider Care Teams.  These Care Teams include your primary Cardiologist (physician) and Advanced Practice Providers (APPs -  Physician Assistants and Nurse Practitioners) who all work together to provide you with the care you need, when you need it.  Your physician wants you to follow-up in: As needed with Dr. Quentin Ore.   We recommend signing up for the patient portal called "MyChart".  Sign up information is provided on this After Visit Summary.  MyChart is used to connect with patients for Virtual Visits (Telemedicine).  Patients are able to view lab/test results, encounter notes, upcoming appointments, etc.  Non-urgent messages can be sent to your provider as well.   To learn more about what you can do with MyChart, go to  [email or web address].    Any Other Special Instructions Will Be Listed Below (If Applicable).

## 2021-04-30 ENCOUNTER — Other Ambulatory Visit (HOSPITAL_COMMUNITY): Payer: 59

## 2021-06-04 DIAGNOSIS — M1611 Unilateral primary osteoarthritis, right hip: Secondary | ICD-10-CM | POA: Diagnosis not present

## 2021-06-05 ENCOUNTER — Telehealth: Payer: Self-pay

## 2021-06-05 NOTE — Telephone Encounter (Signed)
Patient with diagnosis of Atrial Fibrillation on Xarelto, not Eliquis, for anticoagulation.   ? ?Procedure: Right total hip arthroplasty ?Date of procedure: 08/03/21 ? ?CHA2DS2-VASc Score = 1  ?This indicates a 0.6% annual risk of stroke. ?The patient's score is based upon: ?CHF History: 0 ?HTN History: 0 ?Diabetes History: 0 ?Stroke History: 0 ?Vascular Disease History: 0 ?Age Score: 1 ?Gender Score: 0 ?    ? ?CrCl >100 mL/min ?Platelet count 309k ? ?Per office protocol, patient can hold Xarelto for 3 days prior to procedure. ?

## 2021-06-05 NOTE — Telephone Encounter (Signed)
? ?  Pre-operative Risk Assessment  ?  ?Patient Name: Brent Dougherty  ?DOB: [birth year] ?MRN: [id number]  ? ?  ? ?Request for Surgical Clearance   ? ?Procedure:   Right total hip arthroplasty ? ?Date of Surgery:  Clearance 08/03/21                              ?   ?Surgeon:  Dr. Homero Fellers Aluisio ?Surgeon's Group or Practice Name:  [facility] ?Phone number:  [phone]  ?Fax number:  [phone] Attn: Aida Raider ?  ?Type of Clearance Requested:   ?- Medical  ?- Pharmacy:  Hold Apixaban (Eliquis) Need Eliquis instructions. ?  ?Type of Anesthesia:   Choice ?  ?Additional requests/questions:   ? ?Signed, ?Hafsah Hendler   ?06/05/2021, 10:20 AM  ? ?

## 2021-06-08 ENCOUNTER — Other Ambulatory Visit (HOSPITAL_COMMUNITY): Payer: Self-pay

## 2021-06-08 ENCOUNTER — Telehealth: Payer: Self-pay | Admitting: *Deleted

## 2021-06-08 NOTE — Telephone Encounter (Signed)
Pt is agreeable to tele pre op appt 06/29/21 @ 10 am. Med rec and consent are done.  ?

## 2021-06-08 NOTE — Telephone Encounter (Signed)
Pt is agreeable to tele pre op appt 06/29/21 @ 10 am. Med rec and consent are done.  ? ?  ?Patient Consent for Virtual Visit  ? ? ?   ? ?Brent Dougherty has provided verbal consent on 06/08/2021 for a virtual visit (video or telephone). ? ? ?CONSENT FOR VIRTUAL VISIT FOR:  Brent Dougherty  ?By participating in this virtual visit I agree to the following: ? ?I hereby voluntarily request, consent and authorize [facility] and its employed or contracted physicians, physician assistants, nurse practitioners or other licensed health care professionals (the Practitioner), to provide me with telemedicine health care services (the ?Services") as deemed necessary by the treating Practitioner. I acknowledge and consent to receive the Services by the Practitioner via telemedicine. I understand that the telemedicine visit will involve communicating with the Practitioner through live audiovisual communication technology and the disclosure of certain medical information by electronic transmission. I acknowledge that I have been given the opportunity to request an in-person assessment or other available alternative prior to the telemedicine visit and am voluntarily participating in the telemedicine visit. ? ?I understand that I have the right to withhold or withdraw my consent to the use of telemedicine in the course of my care at any time, without affecting my right to future care or treatment, and that the Practitioner or I may terminate the telemedicine visit at any time. I understand that I have the right to inspect all information obtained and/or recorded in the course of the telemedicine visit and may receive copies of available information for a reasonable fee.  I understand that some of the potential risks of receiving the Services via telemedicine include:  ?Delay or interruption in medical evaluation due to technological equipment failure or disruption; ?Information transmitted may not be sufficient (e.g. poor  resolution of images) to allow for appropriate medical decision making by the Practitioner; and/or  ?In rare instances, security protocols could fail, causing a breach of personal health information. ? ?Furthermore, I acknowledge that it is my responsibility to provide information about my medical history, conditions and care that is complete and accurate to the best of my ability. I acknowledge that Practitioner's advice, recommendations, and/or decision may be based on factors not within their control, such as incomplete or inaccurate data provided by me or distortions of diagnostic images or specimens that may result from electronic transmissions. I understand that the practice of medicine is not an exact science and that Practitioner makes no warranties or guarantees regarding treatment outcomes. I acknowledge that a copy of this consent can be made available to me via my patient portal [facility] MyChart), or I can request a printed copy by calling the office of [facility].   ? ?I understand that my insurance will be billed for this visit.  ? ?I have read or had this consent read to me. ?I understand the contents of this consent, which adequately explains the benefits and risks of the Services being provided via telemedicine.  ?I have been provided ample opportunity to ask questions regarding this consent and the Services and have had my questions answered to my satisfaction. ?I give my informed consent for the services to be provided through the use of telemedicine in my medical care ? ? ? ?

## 2021-06-08 NOTE — Telephone Encounter (Signed)
? ? ?  Name: Brent Dougherty  ?DOB: 1951-02-15  ?MRN: [id number] ? ?Primary Cardiologist: Jenkins Rouge, MD ? ? ?Preoperative team, please contact this patient and set up a phone call appointment for further preoperative risk assessment. Please obtain consent and complete medication review. Thank you for your help. ? ?I confirm that guidance regarding antiplatelet and oral anticoagulation therapy has been completed and, if necessary, noted below. ? ?Per office protocol, patient can hold Xarelto for 3 days prior to procedure. ? ?Ledora Bottcher, PA ?06/08/2021, 8:14 AM ?[facility] [place] ?[address] ?[place], [place] [address] ? ? ?

## 2021-06-19 ENCOUNTER — Ambulatory Visit (HOSPITAL_COMMUNITY): Payer: 59 | Attending: Cardiology

## 2021-06-19 DIAGNOSIS — I4821 Permanent atrial fibrillation: Secondary | ICD-10-CM | POA: Diagnosis not present

## 2021-06-19 LAB — ECHOCARDIOGRAM COMPLETE: S' Lateral: 2.3 cm

## 2021-06-19 MED ORDER — PERFLUTREN LIPID MICROSPHERE
1.0000 mL | INTRAVENOUS | Status: AC | PRN
  Administered 2021-06-19: 1 mL via INTRAVENOUS

## 2021-06-29 ENCOUNTER — Ambulatory Visit (INDEPENDENT_AMBULATORY_CARE_PROVIDER_SITE_OTHER): Payer: 59 | Admitting: Nurse Practitioner

## 2021-06-29 DIAGNOSIS — Z01810 Encounter for preprocedural cardiovascular examination: Secondary | ICD-10-CM

## 2021-06-29 NOTE — Progress Notes (Signed)
? ?Virtual Visit via Telephone Note  ? ?This visit type was conducted due to national recommendations for restrictions regarding the COVID-19 Pandemic (e.g. social distancing) in an effort to limit this patient's exposure and mitigate transmission in our community.  Due to his co-morbid illnesses, this patient is at least at moderate risk for complications without adequate follow up.  This format is felt to be most appropriate for this patient at this time.  The patient did not have access to video technology/had technical difficulties with video requiring transitioning to audio format only (telephone).  All issues noted in this document were discussed and addressed.  No physical exam could be performed with this format.  Please refer to the patient's chart for his  consent to telehealth for [facility]. ? ?Evaluation Performed:  Preoperative cardiovascular risk assessment ?_____________  ? ?Date:  06/29/2021  ? ?Patient ID:  Brent Dougherty, Brent Dougherty 07-Jun-1950, MRN [id number] ?Patient Location:  ?Home ?Provider location:   ?Office ? ?Primary Care Provider:  [name], MD ?Primary Cardiologist:  [name], MD ? ?Chief Complaint  ?  ?71 y.o. y/o male with a h/o permanent atrial fibrillation on Xarelto (CHA2DS2VASc = 2), hypertension, and OSA on CPAP, who is pending R hip total arthroplasty scheduled for 08/03/2021 with Dr. [name] [facility], and presents today for telephonic preoperative cardiovascular risk assessment. ? ?Past Medical History  ?  ?Past Medical History:  ?Diagnosis Date  ? Arthritis   ? BOTH KNEES  ? Asthma   ? uncomplicated  ? Atrial fibrillation (HCC) 01/2017  ? Bronchitis   ? LAST TIME WAS JAN 2014  ? Chronic insomnia 11/14/2015  ? Chronic left shoulder pain 01/19/2016  ? Dysrhythmia   ? Edema   ? Elevated blood pressure reading   ? History of blood transfusion 07/27/2017  ? History of kidney stones   ? Morbid obesity (HCC) 11/14/2015  ? Obstructive chronic bronchitis without  exacerbation (HCC) 03/14/2008  ? OSA on CPAP   ? Osteoarthritis of knee 11/14/2015  ? PONV (postoperative nausea and vomiting)   ? ?Past Surgical History:  ?Procedure Laterality Date  ? BILATERAL KNEE ARTHROSCOPY    ? CARDIOVERSION N/A 03/08/2017  ? Procedure: CARDIOVERSION;  Surgeon: [name], MD;  [facility];  Service: Cardiovascular;  Laterality: N/A;  ? CARDIOVERSION N/A 12/10/2019  ? Procedure: CARDIOVERSION;  Surgeon: [name], MD;  Location: [facility];  Service: Cardiovascular;  Laterality: N/A;  ? I & D KNEE WITH POLY EXCHANGE Right 07/19/2018  ? Procedure: IRRIGATION AND DEBRIDEMENT KNEE WITH POLY EXCHANGE;  Surgeon: [name], MD;  [facility];  Service: Orthopedics;  Laterality: Right;  ? KNEE REPAIR EXTENSOR MECHANISM Right 08/16/2018  ? Procedure: KNEE REPAIR EXTENSOR MECHANISM;  Surgeon: [name], MD;  [facility];  Service: Orthopedics;  Laterality: Right;   ? PARTIAL AMPUTATION LEFT THUMB AND TENDON REPAIR    ? TONSILLECTOMY AND ADENOIDECTOMY    ? TOTAL KNEE ARTHROPLASTY Right 06/19/2012  ? Procedure: RIGHT TOTAL KNEE ARTHROPLASTY;  Surgeon: [name], MD;  [facility];  Service: Orthopedics;  Laterality: Right;  ? TOTAL KNEE REVISION Right 07/27/2017  ? Procedure: RIGHT KNEE POLYETHYLENE REVISION;  Surgeon: [name], MD;  [facility];  Service: Orthopedics;  Laterality: Right;  Adductor Block  ? ? ?Allergies ? ?Allergies  ?Allergen Reactions  ? Morphine And Related Other (See Comments)  ?  MILD NAUSEA  ? ? ?History of Present Illness  ?  ?  Brent Dougherty is a 71 y.o. male who presents via audio/video conferencing for a telehealth visit today.  Pt was last seen in cardiology clinic on 04/13/2021 by Dr. Lalla Brothers. At that time Brent Dougherty was doing well from a cardiac standpoint. Repeat echocardiogram showed stable LV function. He was asymptomatic with permanent atrial fibrillation, rate control strategy was recommended. The  patient is now pending procedure as outlined above. Since his last visit, he done well from a cardiac standpoint.  He remains active, despite his need for hip surgery.  He has lost over 60 pounds since November. He denies chest pain, palpitations, dyspnea, pnd, orthopnea, n, v, dizziness, syncope, edema, weight gain, or early satiety. Overall, he reports feeling well and denies any new concerns today. ? ?Home Medications  ?  ?Prior to Admission medications   ?Medication Sig Start Date End Date Taking? Authorizing Provider  ?acetaminophen (TYLENOL) 325 MG tablet Take 325 mg by mouth every 6 (six) hours as needed. For surgical knee pain as needed    [provider]  ?diphenhydramine-acetaminophen (TYLENOL PM) 25-500 MG TABS tablet Take 2 tablets by mouth at bedtime as needed (sleep).     [provider]  ?hydrochlorothiazide (MICROZIDE) 12.5 MG capsule Take 1 capsule (12.5 mg total) by mouth 2 (two) times daily. 03/06/21   [name], MD  ?metoprolol tartrate (LOPRESSOR) 25 MG tablet Take 1 tablet (25 mg total) by mouth 2 (two) times daily. 03/06/21   [name], MD  ?rivaroxaban (XARELTO) 20 MG TABS tablet TAKE 1 TABLET DAILY WITH SUPPER 03/06/21   [name], MD  ? ? ?Physical Exam  ?  ?Vital Signs:  Brent Dougherty does not have vital signs available for review today. ? ?Given telephonic nature of communication, physical exam is limited. ?AAOx3. NAD. Normal affect.  Speech and respirations are unlabored. ? ?Accessory Clinical Findings  ?  ?None ? ?Assessment & Plan  ?  ?1.  Preoperative Cardiovascular Risk Assessment: ? ?According to the Revised Cardiac Risk Index (RCRI), his Perioperative Risk of Major Cardiac Event is (%): 0.4. His Functional Capacity in METs is: 7.25 according to the [facility] Activity Status Index (DASI). Therefore, based on ACC/AHA guidelines, patient would be at acceptable risk for the planned procedure without further cardiovascular testing. ? ?Patient with diagnosis  of Atrial Fibrillation on Xarelto, not Eliquis, for anticoagulation.   ?  ?Procedure: Right total hip arthroplasty ?Date of procedure: 08/03/21 ?  ?CHA2DS2-VASc Score = 1  ?This indicates a 0.6% annual risk of stroke. ?The patient's score is based upon: ?CHF History: 0 ?HTN History: 0 ?Diabetes History: 0 ?Stroke History: 0 ?Vascular Disease History: 0 ?Age Score: 1 ?Gender Score: 0 ?    ?  ?CrCl >100 mL/min ?Platelet count 309k ?  ?Per office protocol, patient can hold Xarelto for 3 days prior to procedure. Please resume Xarelto as soon as possible postprocedure, at the discretion of the surgeon. ? ?A copy of this note will be routed to requesting surgeon. ? ?Time:   ?Today, I have spent 9 minutes with the patient with telehealth technology discussing medical history, symptoms, and management plan.   ? ? ?Joylene Grapes, NP ? ?06/29/2021, 10:11 AM ? ?

## 2021-07-16 NOTE — H&P (Addendum)
TOTAL HIP ADMISSION H&P  Patient is admitted for right total hip arthroplasty.  Subjective:  Chief Complaint: Right hip pain  HPI: Brent Dougherty, 71 y.o. male, has a history of pain and functional disability in the right hip due to arthritis and patient has failed non-surgical conservative treatments for greater than 12 weeks to include NSAID's and/or analgesics, use of assistive devices, weight reduction as appropriate, and activity modification. Onset of symptoms was gradual, starting  several  years ago with gradually worsening course since that time. The patient noted no past surgery on the right hip. Patient currently rates pain in the right hip at 9 out of 10 with activity. Patient has night pain and worsening of pain with activity and weight bearing. Patient has evidence of  severe end-stage arthritis of the right hip, bone-on-bone, with some flattening of the femoral head  by imaging studies. This condition presents safety issues increasing the risk of falls. There is no current active infection.  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   PONV (postoperative nausea and vomiting)    Persistent atrial fibrillation ([place])    History of kidney stones    Elevated blood pressure reading    Bronchitis    Asthma    Arthritis    Chronic left shoulder pain 01/19/2016   Osteoarthritis of knee 11/14/2015   Chronic insomnia 11/14/2015   Obstructive sleep apnea 12/06/2014   Obesity, morbid ([place]) 11/05/2014   Insomnia 11/05/2014   OA (osteoarthritis) of knee 06/19/2012   Obstructive chronic bronchitis without exacerbation ([place]) 03/14/2008   OTHER GENERAL SYMPTOMS 02/29/2008   Other general symptoms and signs 02/29/2008    Past Medical History:  Diagnosis Date   Arthritis    BOTH KNEES   Asthma    uncomplicated    Atrial fibrillation ([place]) 01/2017   Bronchitis    LAST TIME WAS JAN 2014   Chronic insomnia 11/14/2015   Chronic left shoulder pain 01/19/2016   Dysrhythmia    Edema    Elevated blood pressure reading    History of blood transfusion 07/27/2017   History of kidney stones    Morbid obesity ([place]) 11/14/2015   Obstructive chronic bronchitis without exacerbation ([place]) 03/14/2008   OSA on CPAP    Osteoarthritis of knee 11/14/2015   PONV (postoperative nausea and vomiting)     Past Surgical History:  Procedure Laterality Date   BILATERAL KNEE ARTHROSCOPY     CARDIOVERSION N/A 03/08/2017   Procedure: CARDIOVERSION;  Surgeon: Josue Hector, MD;  Location: [place];  Service: Cardiovascular;  Laterality: N/A;   CARDIOVERSION N/A 12/10/2019   Procedure: CARDIOVERSION;  Surgeon: Josue Hector, MD;  Location: [place];  Service: Cardiovascular;  Laterality: N/A;   I & D KNEE WITH POLY EXCHANGE Right 07/19/2018   Procedure: IRRIGATION AND DEBRIDEMENT KNEE WITH POLY EXCHANGE;  Surgeon: Gaynelle Arabian, MD;  [facility];  Service: Orthopedics;  Laterality: Right;   KNEE REPAIR EXTENSOR MECHANISM Right 08/16/2018   Procedure: KNEE REPAIR EXTENSOR MECHANISM;  Surgeon: Gaynelle Arabian, MD;  [facility];  Service: Orthopedics;  Laterality: Right;  53min   PARTIAL AMPUTATION LEFT THUMB AND TENDON REPAIR     TONSILLECTOMY AND ADENOIDECTOMY     TOTAL KNEE ARTHROPLASTY Right 06/19/2012   Procedure: RIGHT TOTAL KNEE ARTHROPLASTY;  Surgeon: Gearlean Alf, MD;  [facility];  Service: Orthopedics;  Laterality: Right;   TOTAL KNEE REVISION Right 07/27/2017   Procedure: RIGHT KNEE POLYETHYLENE REVISION;  Surgeon: Gaynelle Arabian, MD;  [facility];  Service: Orthopedics;  Laterality: Right;  Adductor Block    Prior to Admission medications   Medication Sig Start Date End Date Taking? Authorizing Provider  acetaminophen (TYLENOL) 500 MG tablet Take 1,000 mg by mouth every 4 (four)  hours as needed (pain.). For surgical knee pain as needed   Yes [provider]  diphenhydramine-acetaminophen (TYLENOL PM) 25-500 MG TABS tablet Take 2 tablets by mouth at bedtime as needed (sleep).    Yes [provider]  hydrochlorothiazide (MICROZIDE) 12.5 MG capsule Take 1 capsule (12.5 mg total) by mouth 2 (two) times daily. 03/06/21  Yes Josue Hector, MD  metoprolol tartrate (LOPRESSOR) 25 MG tablet Take 1 tablet (25 mg total) by mouth 2 (two) times daily. 03/06/21  Yes Josue Hector, MD  rivaroxaban (XARELTO) 20 MG TABS tablet TAKE 1 TABLET DAILY WITH SUPPER 03/06/21  Yes Josue Hector, MD  sildenafil (VIAGRA) 100 MG tablet Take 50 mg by mouth daily as needed for erectile dysfunction. 05/25/21  Yes [provider]    Allergies  Allergen Reactions   Morphine And Related Other (See Comments)    MILD NAUSEA    Social History   Socioeconomic History   Marital status: Married    Spouse name: Not on file   Number of children: Not on file   Years of education: Not on file   Highest education level: Not on file  Occupational History   Occupation: Environmental consultant  Tobacco Use   Smoking status: Never   Smokeless tobacco: Never  Vaping Use   Vaping Use: Never used  Substance and Sexual Activity   Alcohol use: No   Drug use: No   Sexual activity: Not on file  Other Topics Concern   Not on file  Social History Narrative   Not on file   Social Determinants of Health   Financial Resource Strain: Not on file  Food Insecurity: Not on file  Transportation Needs: Not on file  Physical Activity: Not on file  Stress: Not on file  Social Connections: Not on file  Intimate Partner Violence: Not on file    Tobacco Use: Low Risk    Smoking Tobacco Use: Never   Smokeless Tobacco Use: Never   Passive Exposure: Not on file   Social History   Substance and Sexual Activity  Alcohol Use No    Family History  Problem Relation Age of Onset    Heart disease Father    Diabetes Father    Cancer - Other Father        SKIN   Cancer Mother        BRAIN   Heart disease Paternal Grandfather    Diabetes Paternal Grandfather    Prostate cancer Paternal Grandfather    Cancer - Other Paternal Grandfather        SKIN   Colon cancer Neg Hx    Colon polyps Neg Hx    Esophageal cancer Neg Hx    Rectal cancer Neg Hx    Stomach cancer Neg Hx     Review of Systems  Constitutional:  Negative for chills and fever.  HENT: Negative.    Eyes: Negative.   Respiratory:  Negative for cough and shortness of breath.   Cardiovascular:  Negative for chest pain and palpitations.  Gastrointestinal:  Negative for abdominal pain, constipation, diarrhea, nausea and vomiting.  Genitourinary:  Negative for dysuria, frequency and urgency.  Musculoskeletal:  Positive for joint pain.  Skin:  Negative for rash.    Objective:  Physical Exam: Well nourished and well developed.  General: Alert and oriented x3, cooperative and pleasant, no acute distress.  Head: normocephalic, atraumatic, neck supple.  Eyes: EOMI.  Abdomen: non-tender to palpation and soft, normoactive bowel sounds. Musculoskeletal: He has an antalgic gait pattern on the right  Right Hip Exam: ROM - Flexion 110, IR 0, ER 10, Abduction 20 There is no tenderness over the greater trochanteric bursa.  Calves soft and nontender. Motor function intact in LE. Strength 5/5 LE bilaterally. Neuro: Distal pulses 2+. Sensation to light touch intact in LE.  Vital signs in last 24 hours: BP: ()/()  Arterial Line BP: ()/()   Imaging Review Plain radiographs demonstrate severe degenerative joint disease of the right hip. The bone quality appears to be adequate for age and reported activity level.  Assessment/Plan:  End stage arthritis, right hip  The patient history, physical examination, clinical judgement of the provider and imaging studies are consistent with end stage degenerative joint  disease of the right hip and total hip arthroplasty is deemed medically necessary. The treatment options including medical management, injection therapy, arthroscopy and arthroplasty were discussed at length. The risks and benefits of total hip arthroplasty were presented and reviewed. The risks due to aseptic loosening, infection, stiffness, dislocation/subluxation, thromboembolic complications and other imponderables were discussed. The patient acknowledged the explanation, agreed to proceed with the plan and consent was signed. Patient is being admitted for inpatient treatment for surgery, pain control, PT, OT, prophylactic antibiotics, VTE prophylaxis, progressive ambulation and ADLs and discharge planning.The patient is planning to be discharged  home .  Therapy Plans: HEP Disposition: Home with Wife Planned DVT Prophylaxis: Xarelto (hx a-fib) DME Needed: None PCP: Kathryne Eriksson, MD (clearance received) Cardiologist: Diona Browner, NP (clearance received) TXA: IV Allergies: NKDA Anesthesia Concerns: nausea - has used patch in the past which helped BMI: 36.2 Last HgbA1c: n/a  Pharmacy: [facility]  - Patient was instructed on what medications to stop prior to surgery. - Follow-up visit in 2 weeks with Dr. Wynelle Link - Begin physical therapy following surgery - Pre-operative lab work as pre-surgical testing - Prescriptions will be provided in hospital at time of discharge  R. Jaynie Bream, PA-C Orthopedic Surgery [facility] [place]

## 2021-07-17 NOTE — Progress Notes (Addendum)
Anesthesia Review:  PCP: DR Benedetto Goad- 02/10/21  Cardiologist : Liberty Handy- preop appt on 06/29/21  Dr [name]- cardiologist  Pulmonology- Dr Jetty Duhamel -  LOV 11/12/19  Chest x-ray : EKG : 03/06/21  Echo : 06/19/21  Stress test:2019  Cardiac Cath :  Activity level: can do a flgiht of stairs without difficulty  Sleep Study/ CPAP : no longer uses cpap has loast 70 lbs since 11/2020  Fasting Blood Sugar :      / Checks Blood Sugar -- times a day:   Blood Thinner/ Instructions /Last Dose: ASA / Instructions/ Last Dose :   Xarelto - stop 3 days prior per pt  Anesthesia- PT states he requires a scopolamine patch for tne N/V.

## 2021-07-20 NOTE — Progress Notes (Signed)
DUE TO COVID-19 ONLY  2  VISITOR IS ALLOWED TO COME WITH YOU AND STAY IN THE WAITING ROOM ONLY DURING PRE OP AND PROCEDURE DAY OF SURGERY.   4  VISITOR  MAY VISIT WITH YOU AFTER SURGERY IN YOUR PRIVATE ROOM DURING VISITING HOURS ONLY! YOU MAY HAVE ONE PERSON SPEND THE NITE WITH YOU IN YOUR ROOM AFTER SURGERY.     Your procedure is scheduled on:           08/03/2021   Report to [facility] Main  Entrance   Report to admitting at     1200 noon               DO NOT BRING INSURANCE CARD, PICTURE ID OR WALLET DAY OF SURGERY.      Call this number if you have problems the morning of surgery [phone]    REMEMBER: NO  SOLID FOODS , CANDY, GUM OR MINTS AFTER MIDNITE THE NITE BEFORE SURGERY .       Marland Kitchen CLEAR LIQUIDS UNTIL   1145am             DAY OF SURGERY.      PLEASE FINISH ENSURE DRINK PER SURGEON ORDER  WHICH NEEDS TO BE COMPLETED AT       1145am     MORNING OF SURGERY.       CLEAR LIQUID DIET   Foods Allowed      WATER BLACK COFFEE ( SUGAR OK, NO MILK, CREAM OR CREAMER) REGULAR AND DECAF  TEA ( SUGAR OK NO MILK, CREAM, OR CREAMER) REGULAR AND DECAF  PLAIN JELLO ( NO RED)  FRUIT ICES ( NO RED, NO FRUIT PULP)  POPSICLES ( NO RED)  JUICE- APPLE, WHITE GRAPE AND WHITE CRANBERRY  SPORT DRINK LIKE GATORADE ( NO RED)  CLEAR BROTH ( VEGETABLE , CHICKEN OR BEEF)                                                                     BRUSH YOUR TEETH MORNING OF SURGERY AND RINSE YOUR MOUTH OUT, NO CHEWING GUM CANDY OR MINTS.     Take these medicines the morning of surgery with A SIP OF WATER:  metoprolol    DO NOT TAKE ANY DIABETIC MEDICATIONS DAY OF YOUR SURGERY                               You may not have any metal on your body including hair pins and              piercings  Do not wear jewelry, make-up, lotions, powders or perfumes, deodorant             Do not wear nail polish on your fingernails.              IF YOU ARE A MALE AND WANT TO SHAVE UNDER ARMS OR LEGS PRIOR  TO SURGERY YOU MUST DO SO AT LEAST 48 HOURS PRIOR TO SURGERY.              Men may shave face and neck.   Do not bring valuables to the hospital. [facility] IS NOT  RESPONSIBLE   FOR VALUABLES.  Contacts, dentures or bridgework may not be worn into surgery.  Leave suitcase in the car. After surgery it may be brought to your room.     Patients discharged the day of surgery will not be allowed to drive home. IF YOU ARE HAVING SURGERY AND GOING HOME THE SAME DAY, YOU MUST HAVE AN ADULT TO DRIVE YOU HOME AND BE WITH YOU FOR 24 HOURS. YOU MAY GO HOME BY TAXI OR UBER OR ORTHERWISE, BUT AN ADULT MUST ACCOMPANY YOU HOME AND STAY WITH YOU FOR 24 HOURS.                Please read over the following fact sheets you were given: _____________________________________________________________________  [facility] - Preparing for Surgery Before surgery, you can play an important role.  Because skin is not sterile, your skin needs to be as free of germs as possible.  You can reduce the number of germs on your skin by washing with CHG (chlorahexidine gluconate) soap before surgery.  CHG is an antiseptic cleaner which kills germs and bonds with the skin to continue killing germs even after washing. Please DO NOT use if you have an allergy to CHG or antibacterial soaps.  If your skin becomes reddened/irritated stop using the CHG and inform your nurse when you arrive at Short Stay. Do not shave (including legs and underarms) for at least 48 hours prior to the first CHG shower.  You may shave your face/neck. Please follow these instructions carefully:  1.  Shower with CHG Soap the night before surgery and the  morning of Surgery.  2.  If you choose to wash your hair, wash your hair first as usual with your  normal  shampoo.  3.  After you shampoo, rinse your hair and body thoroughly to remove the  shampoo.                           4.  Use CHG as you would any other liquid soap.  You can apply chg  directly  to the skin and wash                       Gently with a scrungie or clean washcloth.  5.  Apply the CHG Soap to your body ONLY FROM THE NECK DOWN.   Do not use on face/ open                           Wound or open sores. Avoid contact with eyes, ears mouth and genitals (private parts).                       Wash face,  Genitals (private parts) with your normal soap.             6.  Wash thoroughly, paying special attention to the area where your surgery  will be performed.  7.  Thoroughly rinse your body with warm water from the neck down.  8.  DO NOT shower/wash with your normal soap after using and rinsing off  the CHG Soap.                9.  Pat yourself dry with a clean towel.            10.  Wear clean pajamas.  11.  Place clean sheets on your bed the night of your first shower and do not  sleep with pets. Day of Surgery : Do not apply any lotions/deodorants the morning of surgery.  Please wear clean clothes to the hospital/surgery center.  FAILURE TO FOLLOW THESE INSTRUCTIONS MAY RESULT IN THE CANCELLATION OF YOUR SURGERY PATIENT SIGNATURE_________________________________  NURSE SIGNATURE__________________________________  ________________________________________________________________________

## 2021-07-21 ENCOUNTER — Encounter (HOSPITAL_COMMUNITY): Payer: Self-pay

## 2021-07-21 ENCOUNTER — Other Ambulatory Visit: Payer: Self-pay

## 2021-07-21 ENCOUNTER — Encounter (HOSPITAL_COMMUNITY)
Admission: RE | Admit: 2021-07-21 | Discharge: 2021-07-21 | Disposition: A | Discharge status: Home / Self Care | Payer: 59 | Source: Ambulatory Visit | Attending: Orthopedic Surgery | Admitting: Orthopedic Surgery

## 2021-07-21 VITALS — BP 120/78 | HR 73 | Temp 98.5°F | Resp 16 | Ht 73.0 in | Wt 278.0 lb

## 2021-07-21 DIAGNOSIS — Z01818 Encounter for other preprocedural examination: Secondary | ICD-10-CM | POA: Diagnosis not present

## 2021-07-21 HISTORY — DX: Essential (primary) hypertension: I10

## 2021-07-21 LAB — TYPE AND SCREEN
ABO/RH(D): A POS
Antibody Screen: NEGATIVE

## 2021-07-21 LAB — BASIC METABOLIC PANEL
Anion gap: 9 (ref 5–15)
BUN: 19 mg/dL (ref 8–23)
CO2: 27 mmol/L (ref 22–32)
Calcium: 10 mg/dL (ref 8.9–10.3)
Chloride: 101 mmol/L (ref 98–111)
Creatinine, Ser: 0.86 mg/dL (ref 0.61–1.24)
GFR, Estimated: >60 mL/min (ref >60)
Glucose, Bld: 88 mg/dL (ref 70–99)
Potassium: 3.8 mmol/L (ref 3.5–5.1)
Sodium: 137 mmol/L (ref 135–145)

## 2021-07-21 LAB — CBC
HCT: 46 % (ref 39.0–52.0)
Hemoglobin: 16 g/dL (ref 13.0–17.0)
MCH: 32 pg (ref 26.0–34.0)
MCHC: 34.8 g/dL (ref 30.0–36.0)
MCV: 92 fL (ref 80.0–100.0)
Platelets: 338 10*3/uL (ref 150–400)
RBC: 5 MIL/uL (ref 4.22–5.81)
RDW: 13.1 % (ref 11.5–15.5)
WBC: 6.4 10*3/uL (ref 4.0–10.5)
nRBC: 0 % (ref 0.0–0.2)

## 2021-07-21 LAB — SURGICAL PCR SCREEN
MRSA, PCR: NEGATIVE
Staphylococcus aureus: NEGATIVE

## 2021-07-23 NOTE — Progress Notes (Signed)
Anesthesia Chart Review:   Case: [id number] Date/Time: 08/03/21 1435   Procedure: TOTAL HIP ARTHROPLASTY ANTERIOR APPROACH (Right: Hip)   Anesthesia type: Choice   Pre-op diagnosis: right hip osteoarthritis   [facility]   Surgeons: [name], MD       DISCUSSION: Pt is 71 years old with hx permanent atrial fibrillation, HTN, OSA, asthma  Pt to stop xarelto 3 days before surgery  VS: BP 120/78   Pulse 73   Temp 36.9 C (Oral)   Resp 16   Ht 6\' 1"  (1.854 m)   Wt 126.1 kg   SpO2 98%   BMI 36.68 kg/m   PROVIDERS: - PCP is , MD (notes in care everywhere)   - Cardiologist is [name], MD. Last virtual visit 06/29/21 with 07/01/21, NP who cleared pt for surgery at acceptable risk  - Evaluated by EP cardiologist Bernadene Person, MD at office visit 04/13/21 - attempts at rhythm control not recommended, focus on rate control advised. PRN f/u recommended   LABS: Labs reviewed: Acceptable for surgery. (all labs ordered are listed, but only abnormal results are displayed)  Labs Reviewed  SURGICAL PCR SCREEN  BASIC METABOLIC PANEL  CBC  TYPE AND SCREEN    EKG 03/06/21: afib rae 82 inferior T wave inversion   CV: Echo 06/19/21:  1. Left ventricular ejection fraction, by estimation, is 60 to 65%. The left ventricle has normal function. The left ventricle has no regional wall motion abnormalities. There is mild concentric left ventricular hypertrophy. Left ventricular diastolic parameters are indeterminate.  2. Right ventricular systolic function is normal. The right ventricular size is normal. Tricuspid regurgitation signal is inadequate for assessing PA pressure.  3. The mitral valve is grossly normal. Mild mitral valve regurgitation. No evidence of mitral stenosis.  4. The aortic valve is tricuspid. Aortic valve regurgitation is not visualized.  5. The inferior vena cava is normal in size with <50% respiratory variability, suggesting  right atrial pressure of 8 mmHg.  - Comparison(s): A prior study was performed on 01/27/2017. No significant change from prior study. Prior images reviewed side by side. 01/27/17 EF 55-60%.   Past Medical History:  Diagnosis Date   Arthritis    BOTH KNEES   Asthma    uncomplicated   Atrial fibrillation ([facility]) 01/2017   Bronchitis    LAST TIME WAS JAN 2014   Chronic insomnia 11/14/2015   Chronic left shoulder pain 01/19/2016   Dysrhythmia    Edema    Elevated blood pressure reading    History of blood transfusion 07/27/2017   History of kidney stones    Hypertension    Morbid obesity ([facility]) 11/14/2015   Obstructive chronic bronchitis without exacerbation ([facility]) 03/14/2008   OSA on CPAP    not in use   Osteoarthritis of knee 11/14/2015   PONV (postoperative nausea and vomiting)     Past Surgical History:  Procedure Laterality Date   BILATERAL KNEE ARTHROSCOPY     CARDIOVERSION N/A 03/08/2017   Procedure: CARDIOVERSION;  Surgeon: 03/10/2017, MD;  Location[facility] ENDOSCOPY;  Service: Cardiovascular;  Laterality: N/A;   CARDIOVERSION N/A 12/10/2019   Procedure: CARDIOVERSION;  Surgeon: 12/12/2019, MD;  Location: [facility] ENDOSCOPY;  Service: Cardiovascular;  Laterality: N/A;   I & D KNEE WITH POLY EXCHANGE Right 07/19/2018   Procedure: IRRIGATION AND DEBRIDEMENT KNEE WITH POLY EXCHANGE;  Surgeon: 09/18/2018, MD;  [facility];  Service: Orthopedics;  Laterality: Right;   KNEE REPAIR EXTENSOR MECHANISM Right 08/16/2018   Procedure: KNEE REPAIR EXTENSOR MECHANISM;  Surgeon: [name], MD;  [facility];  Service: Orthopedics;  Laterality: Right;    PARTIAL AMPUTATION LEFT THUMB AND TENDON REPAIR     TONSILLECTOMY AND ADENOIDECTOMY     TOTAL KNEE ARTHROPLASTY Right 06/19/2012   Procedure: RIGHT TOTAL KNEE ARTHROPLASTY;  Surgeon: [name], MD;  [facility];  Service: Orthopedics;  Laterality: Right;   TOTAL KNEE REVISION Right 07/27/2017   Procedure: RIGHT  KNEE POLYETHYLENE REVISION;  Surgeon: [name], MD;  [facility];  Service: Orthopedics;  Laterality: Right;  Adductor Block    MEDICATIONS:  acetaminophen (TYLENOL) 500 MG tablet   diphenhydramine-acetaminophen (TYLENOL PM) 25-500 MG TABS tablet   hydrochlorothiazide (MICROZIDE) 12.5 MG capsule   metoprolol tartrate (LOPRESSOR) 25 MG tablet   rivaroxaban (XARELTO) 20 MG TABS tablet   sildenafil (VIAGRA) 100 MG tablet   No current facility-administered medications for this encounter.    If no changes, I anticipate pt can proceed with surgery as scheduled.   Rica Mast, PhD, FNP-BC [facility]/Anesthesiology Phone: [phone] 07/23/2021 10:00 AM

## 2021-07-23 NOTE — Anesthesia Preprocedure Evaluation (Addendum)
Anesthesia Evaluation  Patient identified by MRN, date of birth, ID band Patient awake    Reviewed: Allergy & Precautions, NPO status , Patient's Chart, lab work & pertinent test results, reviewed documented beta blocker date and time   History of Anesthesia Complications (+) PONV and history of anesthetic complications  Airway Mallampati: III  TM Distance: >3 FB Neck ROM: Full    Dental  (+) Teeth Intact, Dental Advisory Given   Pulmonary asthma (well controlled) , sleep apnea (only uses CPAP occasionally) and Continuous Positive Airway Pressure Ventilation , COPD,    Pulmonary exam normal breath sounds clear to auscultation       Cardiovascular hypertension (130/90 in preop), Pt. on medications and Pt. on home beta blockers + dysrhythmias (xarelto LD 6d ago) Atrial Fibrillation + Valvular Problems/Murmurs (mild MR) MR  Rhythm:Irregular Rate:Normal  Echo 06/2021 1. Left ventricular ejection fraction, by estimation, is 60 to 65%. The  left ventricle has normal function. The left ventricle has no regional  wall motion abnormalities. There is mild concentric left ventricular  hypertrophy. Left ventricular diastolic  parameters are indeterminate.  2. Right ventricular systolic function is normal. The right ventricular  size is normal. Tricuspid regurgitation signal is inadequate for assessing  PA pressure.  3. The mitral valve is grossly normal. Mild mitral valve regurgitation.  No evidence of mitral stenosis.  4. The aortic valve is tricuspid. Aortic valve regurgitation is not  visualized.  5. The inferior vena cava is normal in size with <50% respiratory  variability, suggesting right atrial pressure of 8 mmHg.    Neuro/Psych negative neurological ROS  negative psych ROS   GI/Hepatic negative GI ROS, Neg liver ROS,   Endo/Other  BMI 37  Renal/GU negative Renal ROS  negative genitourinary   Musculoskeletal  (+)  Arthritis , Osteoarthritis,    Abdominal (+) + obese,   Peds  Hematology negative hematology ROS (+) Hb 16, plt 338   Anesthesia Other Findings   Reproductive/Obstetrics negative OB ROS                          Anesthesia Physical Anesthesia Plan  ASA: 3  Anesthesia Plan: Spinal and MAC   Post-op Pain Management: Ofirmev IV (intra-op)*   Induction:   PONV Risk Score and Plan: 2 and Propofol infusion and TIVA  Airway Management Planned: Natural Airway and Nasal Cannula  Additional Equipment: None  Intra-op Plan:   Post-operative Plan:   Informed Consent: I have reviewed the patients History and Physical, chart, labs and discussed the procedure including the risks, benefits and alternatives for the proposed anesthesia with the patient or authorized representative who has indicated his/her understanding and acceptance.       Plan Discussed with: CRNA  Anesthesia Plan Comments: (Spinal anesthetic 2020 w/o issue)      Anesthesia Quick Evaluation

## 2021-08-03 ENCOUNTER — Other Ambulatory Visit: Payer: Self-pay

## 2021-08-03 ENCOUNTER — Encounter (HOSPITAL_COMMUNITY)
Admission: RE | Disposition: A | Discharge status: Home / Self Care | Payer: Self-pay | Source: Home / Self Care | Attending: Orthopedic Surgery

## 2021-08-03 ENCOUNTER — Encounter (HOSPITAL_COMMUNITY): Payer: Self-pay | Admitting: Orthopedic Surgery

## 2021-08-03 ENCOUNTER — Observation Stay (HOSPITAL_COMMUNITY): Payer: 59

## 2021-08-03 ENCOUNTER — Ambulatory Visit (HOSPITAL_BASED_OUTPATIENT_CLINIC_OR_DEPARTMENT_OTHER): Payer: 59 | Admitting: Anesthesiology

## 2021-08-03 ENCOUNTER — Ambulatory Visit (HOSPITAL_COMMUNITY): Payer: 59

## 2021-08-03 ENCOUNTER — Ambulatory Visit (HOSPITAL_COMMUNITY): Payer: 59 | Admitting: Emergency Medicine

## 2021-08-03 ENCOUNTER — Observation Stay (HOSPITAL_COMMUNITY)
Admission: RE | Admit: 2021-08-03 | Discharge: 2021-08-04 | Disposition: A | Discharge status: Home / Self Care | Payer: 59 | Attending: Orthopedic Surgery | Admitting: Orthopedic Surgery

## 2021-08-03 DIAGNOSIS — Z96641 Presence of right artificial hip joint: Secondary | ICD-10-CM | POA: Diagnosis not present

## 2021-08-03 DIAGNOSIS — J45909 Unspecified asthma, uncomplicated: Secondary | ICD-10-CM | POA: Insufficient documentation

## 2021-08-03 DIAGNOSIS — I4819 Other persistent atrial fibrillation: Secondary | ICD-10-CM | POA: Diagnosis not present

## 2021-08-03 DIAGNOSIS — M1611 Unilateral primary osteoarthritis, right hip: Secondary | ICD-10-CM

## 2021-08-03 DIAGNOSIS — Z96651 Presence of right artificial knee joint: Secondary | ICD-10-CM | POA: Insufficient documentation

## 2021-08-03 DIAGNOSIS — I10 Essential (primary) hypertension: Secondary | ICD-10-CM

## 2021-08-03 DIAGNOSIS — Z79899 Other long term (current) drug therapy: Secondary | ICD-10-CM | POA: Diagnosis not present

## 2021-08-03 DIAGNOSIS — Z7901 Long term (current) use of anticoagulants: Secondary | ICD-10-CM | POA: Diagnosis not present

## 2021-08-03 DIAGNOSIS — Z01818 Encounter for other preprocedural examination: Secondary | ICD-10-CM

## 2021-08-03 DIAGNOSIS — J449 Chronic obstructive pulmonary disease, unspecified: Secondary | ICD-10-CM | POA: Diagnosis not present

## 2021-08-03 DIAGNOSIS — M169 Osteoarthritis of hip, unspecified: Secondary | ICD-10-CM | POA: Diagnosis present

## 2021-08-03 DIAGNOSIS — G4733 Obstructive sleep apnea (adult) (pediatric): Secondary | ICD-10-CM | POA: Diagnosis not present

## 2021-08-03 DIAGNOSIS — I4891 Unspecified atrial fibrillation: Secondary | ICD-10-CM

## 2021-08-03 DIAGNOSIS — Z471 Aftercare following joint replacement surgery: Secondary | ICD-10-CM | POA: Diagnosis not present

## 2021-08-03 DIAGNOSIS — Z9989 Dependence on other enabling machines and devices: Secondary | ICD-10-CM | POA: Diagnosis not present

## 2021-08-03 HISTORY — PX: TOTAL HIP ARTHROPLASTY: SHX124

## 2021-08-03 SURGERY — ARTHROPLASTY, HIP, TOTAL, ANTERIOR APPROACH
Anesthesia: Monitor Anesthesia Care | Site: Hip | Laterality: Right

## 2021-08-03 MED ORDER — ALBUMIN HUMAN 5 % IV SOLN: INTRAVENOUS | Status: DC | PRN

## 2021-08-03 MED ORDER — TRAMADOL HCL 50 MG PO TABS: 50.0000 mg | ORAL_TABLET | Freq: Four times a day (QID) | ORAL | Status: DC | PRN

## 2021-08-03 MED ORDER — BUPIVACAINE-EPINEPHRINE (PF) 0.25% -1:200000 IJ SOLN
INTRAMUSCULAR | Status: AC
  Filled 2021-08-03: qty 30

## 2021-08-03 MED ORDER — ONDANSETRON HCL 4 MG/2ML IJ SOLN: 4.0000 mg | Freq: Once | INTRAMUSCULAR | Status: DC | PRN

## 2021-08-03 MED ORDER — MENTHOL 3 MG MT LOZG: 1.0000 | LOZENGE | OROMUCOSAL | Status: DC | PRN

## 2021-08-03 MED ORDER — HYDROCHLOROTHIAZIDE 12.5 MG PO TABS
12.5000 mg | ORAL_TABLET | Freq: Two times a day (BID) | ORAL | Status: DC
  Administered 2021-08-04: 12.5 mg via ORAL
  Filled 2021-08-03: qty 1

## 2021-08-03 MED ORDER — RIVAROXABAN 10 MG PO TABS
10.0000 mg | ORAL_TABLET | Freq: Every day | ORAL | Status: DC
  Administered 2021-08-04: 10 mg via ORAL
  Filled 2021-08-03: qty 1

## 2021-08-03 MED ORDER — LACTATED RINGERS IV SOLN: INTRAVENOUS | Status: DC

## 2021-08-03 MED ORDER — POLYETHYLENE GLYCOL 3350 17 G PO PACK: 17.0000 g | PACK | Freq: Every day | ORAL | Status: DC | PRN

## 2021-08-03 MED ORDER — PROPOFOL 10 MG/ML IV BOLUS
INTRAVENOUS | Status: DC | PRN
  Administered 2021-08-03: 20 mg via INTRAVENOUS
  Administered 2021-08-03: 10 mg via INTRAVENOUS

## 2021-08-03 MED ORDER — HYDROMORPHONE HCL 1 MG/ML IJ SOLN: 0.2500 mg | INTRAMUSCULAR | Status: DC | PRN

## 2021-08-03 MED ORDER — HYDROMORPHONE HCL 1 MG/ML IJ SOLN
0.5000 mg | INTRAMUSCULAR | Status: DC | PRN
  Administered 2021-08-03: 1 mg via INTRAVENOUS
  Filled 2021-08-03: qty 1

## 2021-08-03 MED ORDER — CEFAZOLIN SODIUM-DEXTROSE 2-4 GM/100ML-% IV SOLN
2.0000 g | INTRAVENOUS | Status: AC
  Administered 2021-08-03: 2 g via INTRAVENOUS
  Filled 2021-08-03: qty 100

## 2021-08-03 MED ORDER — AMISULPRIDE (ANTIEMETIC) 5 MG/2ML IV SOLN: 10.0000 mg | Freq: Once | INTRAVENOUS | Status: DC | PRN

## 2021-08-03 MED ORDER — PHENYLEPHRINE 80 MCG/ML (10ML) SYRINGE FOR IV PUSH (FOR BLOOD PRESSURE SUPPORT)
PREFILLED_SYRINGE | INTRAVENOUS | Status: DC | PRN
  Administered 2021-08-03: 80 ug via INTRAVENOUS
  Administered 2021-08-03: 160 ug via INTRAVENOUS
  Administered 2021-08-03 (×2): 80 ug via INTRAVENOUS

## 2021-08-03 MED ORDER — ONDANSETRON HCL 4 MG/2ML IJ SOLN
INTRAMUSCULAR | Status: DC | PRN
  Administered 2021-08-03: 4 mg via INTRAVENOUS

## 2021-08-03 MED ORDER — CEFAZOLIN SODIUM-DEXTROSE 2-4 GM/100ML-% IV SOLN
2.0000 g | Freq: Four times a day (QID) | INTRAVENOUS | Status: AC
  Administered 2021-08-03 – 2021-08-04 (×2): 2 g via INTRAVENOUS
  Filled 2021-08-03 (×2): qty 100

## 2021-08-03 MED ORDER — PHENOL 1.4 % MT LIQD
1.0000 | OROMUCOSAL | Status: DC | PRN
Start: 2021-08-03 — End: 2021-08-04

## 2021-08-03 MED ORDER — BUPIVACAINE IN DEXTROSE 0.75-8.25 % IT SOLN
INTRATHECAL | Status: DC | PRN
  Administered 2021-08-03: 2 mL via INTRATHECAL

## 2021-08-03 MED ORDER — HYDROCODONE-ACETAMINOPHEN 5-325 MG PO TABS
1.0000 | ORAL_TABLET | ORAL | Status: DC | PRN
  Administered 2021-08-03 – 2021-08-04 (×4): 2 via ORAL
  Filled 2021-08-03 (×5): qty 2

## 2021-08-03 MED ORDER — OXYCODONE HCL 5 MG PO TABS: 5.0000 mg | ORAL_TABLET | Freq: Once | ORAL | Status: DC | PRN

## 2021-08-03 MED ORDER — BUPIVACAINE-EPINEPHRINE (PF) 0.25% -1:200000 IJ SOLN
INTRAMUSCULAR | Status: DC | PRN
  Administered 2021-08-03: 30 mL via PERINEURAL

## 2021-08-03 MED ORDER — ONDANSETRON HCL 4 MG/2ML IJ SOLN: 4.0000 mg | Freq: Four times a day (QID) | INTRAMUSCULAR | Status: DC | PRN

## 2021-08-03 MED ORDER — PHENYLEPHRINE HCL-NACL 20-0.9 MG/250ML-% IV SOLN
INTRAVENOUS | Status: DC | PRN
  Administered 2021-08-03: 30 ug/min via INTRAVENOUS

## 2021-08-03 MED ORDER — PROPOFOL 500 MG/50ML IV EMUL
INTRAVENOUS | Status: AC
  Filled 2021-08-03: qty 50

## 2021-08-03 MED ORDER — ORAL CARE MOUTH RINSE: 15.0000 mL | Freq: Once | OROMUCOSAL | Status: AC

## 2021-08-03 MED ORDER — ACETAMINOPHEN 325 MG PO TABS: 325.0000 mg | ORAL_TABLET | Freq: Four times a day (QID) | ORAL | Status: DC | PRN

## 2021-08-03 MED ORDER — ACETAMINOPHEN 10 MG/ML IV SOLN
1000.0000 mg | Freq: Four times a day (QID) | INTRAVENOUS | Status: DC
  Administered 2021-08-03: 1000 mg via INTRAVENOUS
  Filled 2021-08-03: qty 100

## 2021-08-03 MED ORDER — PROPOFOL 500 MG/50ML IV EMUL
INTRAVENOUS | Status: DC | PRN
  Administered 2021-08-03: 50 ug/kg/min via INTRAVENOUS

## 2021-08-03 MED ORDER — METOCLOPRAMIDE HCL 5 MG PO TABS: 5.0000 mg | ORAL_TABLET | Freq: Three times a day (TID) | ORAL | Status: DC | PRN

## 2021-08-03 MED ORDER — WATER FOR IRRIGATION, STERILE IR SOLN
Status: DC | PRN
  Administered 2021-08-03: 2000 mL

## 2021-08-03 MED ORDER — METOPROLOL TARTRATE 25 MG PO TABS
25.0000 mg | ORAL_TABLET | Freq: Two times a day (BID) | ORAL | Status: DC
  Administered 2021-08-03 – 2021-08-04 (×2): 25 mg via ORAL
  Filled 2021-08-03 (×2): qty 1

## 2021-08-03 MED ORDER — SODIUM CHLORIDE 0.9 % IV SOLN
INTRAVENOUS | Status: DC
Start: 2021-08-03 — End: 2021-08-04

## 2021-08-03 MED ORDER — ONDANSETRON HCL 4 MG PO TABS: 4.0000 mg | ORAL_TABLET | Freq: Four times a day (QID) | ORAL | Status: DC | PRN

## 2021-08-03 MED ORDER — TRANEXAMIC ACID-NACL 1000-0.7 MG/100ML-% IV SOLN
1000.0000 mg | INTRAVENOUS | Status: AC
  Administered 2021-08-03: 1000 mg via INTRAVENOUS
  Filled 2021-08-03: qty 100

## 2021-08-03 MED ORDER — DEXAMETHASONE SODIUM PHOSPHATE 10 MG/ML IJ SOLN
8.0000 mg | Freq: Once | INTRAMUSCULAR | Status: AC
  Administered 2021-08-03: 8 mg via INTRAVENOUS

## 2021-08-03 MED ORDER — SCOPOLAMINE 1 MG/3DAYS TD PT72
1.0000 | MEDICATED_PATCH | TRANSDERMAL | Status: DC
  Administered 2021-08-03: 1.5 mg via TRANSDERMAL

## 2021-08-03 MED ORDER — CHLORHEXIDINE GLUCONATE 0.12 % MT SOLN
15.0000 mL | Freq: Once | OROMUCOSAL | Status: AC
  Administered 2021-08-03: 15 mL via OROMUCOSAL

## 2021-08-03 MED ORDER — METOCLOPRAMIDE HCL 5 MG/ML IJ SOLN: 5.0000 mg | Freq: Three times a day (TID) | INTRAMUSCULAR | Status: DC | PRN

## 2021-08-03 MED ORDER — DEXAMETHASONE SODIUM PHOSPHATE 10 MG/ML IJ SOLN
10.0000 mg | Freq: Once | INTRAMUSCULAR | Status: AC
  Administered 2021-08-04: 10 mg via INTRAVENOUS
  Filled 2021-08-03: qty 1

## 2021-08-03 MED ORDER — DIPHENHYDRAMINE HCL 12.5 MG/5ML PO ELIX: 12.5000 mg | ORAL_SOLUTION | ORAL | Status: DC | PRN

## 2021-08-03 MED ORDER — OXYCODONE HCL 5 MG/5ML PO SOLN: 5.0000 mg | Freq: Once | ORAL | Status: DC | PRN

## 2021-08-03 MED ORDER — SCOPOLAMINE 1 MG/3DAYS TD PT72
MEDICATED_PATCH | TRANSDERMAL | Status: AC
  Filled 2021-08-03: qty 1

## 2021-08-03 MED ORDER — BISACODYL 10 MG RE SUPP: 10.0000 mg | Freq: Every day | RECTAL | Status: DC | PRN

## 2021-08-03 MED ORDER — METHOCARBAMOL 500 MG IVPB - SIMPLE MED: 500.0000 mg | Freq: Four times a day (QID) | INTRAVENOUS | Status: DC | PRN

## 2021-08-03 MED ORDER — DOCUSATE SODIUM 100 MG PO CAPS
100.0000 mg | ORAL_CAPSULE | Freq: Two times a day (BID) | ORAL | Status: DC
  Administered 2021-08-03 – 2021-08-04 (×2): 100 mg via ORAL
  Filled 2021-08-03 (×2): qty 1

## 2021-08-03 MED ORDER — FLEET ENEMA 7-19 GM/118ML RE ENEM: 1.0000 | ENEMA | Freq: Once | RECTAL | Status: DC | PRN

## 2021-08-03 MED ORDER — 0.9 % SODIUM CHLORIDE (POUR BTL) OPTIME
TOPICAL | Status: DC | PRN
  Administered 2021-08-03: 1000 mL

## 2021-08-03 MED ORDER — POVIDONE-IODINE 10 % EX SWAB
2.0000 | Freq: Once | CUTANEOUS | Status: AC
  Administered 2021-08-03: 2 via TOPICAL

## 2021-08-03 MED ORDER — METHOCARBAMOL 500 MG PO TABS
500.0000 mg | ORAL_TABLET | Freq: Four times a day (QID) | ORAL | Status: DC | PRN
  Administered 2021-08-04: 500 mg via ORAL
  Filled 2021-08-03: qty 1

## 2021-08-03 SURGICAL SUPPLY — 47 items
BAG COUNTER SPONGE SURGICOUNT (BAG) ×1 IMPLANT
BAG DECANTER FOR FLEXI CONT (MISCELLANEOUS) IMPLANT
BAG SPEC THK2 15X12 ZIP CLS (MISCELLANEOUS)
BAG SPNG CNTER NS LX DISP (BAG) ×1
BAG ZIPLOCK 12X15 (MISCELLANEOUS) IMPLANT
BLADE SAG 18X100X1.27 (BLADE) ×3 IMPLANT
CLSR STERI-STRIP ANTIMIC 1/2X4 (GAUZE/BANDAGES/DRESSINGS) ×1 IMPLANT
COVER PERINEAL POST (MISCELLANEOUS) ×3 IMPLANT
COVER SURGICAL LIGHT HANDLE (MISCELLANEOUS) ×3 IMPLANT
CUP ACETBLR 54 OD PINNACLE (Hips) ×1 IMPLANT
DRAPE FOOT SWITCH (DRAPES) ×3 IMPLANT
DRAPE STERI IOBAN 125X83 (DRAPES) ×3 IMPLANT
DRAPE U-SHAPE 47X51 STRL (DRAPES) ×6 IMPLANT
DRSG AQUACEL AG ADV 3.5X10 (GAUZE/BANDAGES/DRESSINGS) ×3 IMPLANT
DURAPREP 26ML APPLICATOR (WOUND CARE) ×3 IMPLANT
ELECT REM PT RETURN 15FT ADLT (MISCELLANEOUS) ×3 IMPLANT
GLOVE BIO SURGEON STRL SZ 6.5 (GLOVE) IMPLANT
GLOVE BIO SURGEON STRL SZ7.5 (GLOVE) IMPLANT
GLOVE BIO SURGEON STRL SZ8 (GLOVE) ×3 IMPLANT
GLOVE BIOGEL PI IND STRL 6.5 (GLOVE) IMPLANT
GLOVE BIOGEL PI IND STRL 7.0 (GLOVE) IMPLANT
GLOVE BIOGEL PI IND STRL 8 (GLOVE) ×2 IMPLANT
GLOVE BIOGEL PI INDICATOR 6.5 (GLOVE)
GLOVE BIOGEL PI INDICATOR 7.0 (GLOVE)
GLOVE BIOGEL PI INDICATOR 8 (GLOVE) ×1
GOWN STRL REUS W/ TWL LRG LVL3 (GOWN DISPOSABLE) ×2 IMPLANT
GOWN STRL REUS W/ TWL XL LVL3 (GOWN DISPOSABLE) IMPLANT
GOWN STRL REUS W/TWL LRG LVL3 (GOWN DISPOSABLE) ×2
GOWN STRL REUS W/TWL XL LVL3 (GOWN DISPOSABLE)
HEAD CERAMIC DELTA 36 PLUS 1.5 (Hips) ×1 IMPLANT
HOLDER FOLEY CATH W/STRAP (MISCELLANEOUS) ×3 IMPLANT
KIT TURNOVER KIT A (KITS) ×1 IMPLANT
LINER MARATHON NEUT +4X54X36 (Hips) ×1 IMPLANT
MANIFOLD NEPTUNE II (INSTRUMENTS) ×3 IMPLANT
PACK ANTERIOR HIP CUSTOM (KITS) ×3 IMPLANT
PENCIL SMOKE EVACUATOR COATED (MISCELLANEOUS) ×3 IMPLANT
SPIKE FLUID TRANSFER (MISCELLANEOUS) ×3 IMPLANT
STEM FEM ACTIS HIGH SZ7 (Stem) ×1 IMPLANT
STRIP CLOSURE SKIN 1/2X4 (GAUZE/BANDAGES/DRESSINGS) ×3 IMPLANT
SUT ETHIBOND NAB CT1 #1 30IN (SUTURE) ×3 IMPLANT
SUT MNCRL AB 4-0 PS2 18 (SUTURE) ×3 IMPLANT
SUT STRATAFIX 0 PDS 27 VIOLET (SUTURE) ×2
SUT VIC AB 2-0 CT1 27 (SUTURE) ×4
SUT VIC AB 2-0 CT1 TAPERPNT 27 (SUTURE) ×4 IMPLANT
SUTURE STRATFX 0 PDS 27 VIOLET (SUTURE) ×2 IMPLANT
TRAY FOLEY MTR SLVR 16FR STAT (SET/KITS/TRAYS/PACK) ×3 IMPLANT
TUBE SUCTION HIGH CAP CLEAR NV (SUCTIONS) ×3 IMPLANT

## 2021-08-03 NOTE — Interval H&P Note (Signed)
History and Physical Interval Note:  08/03/2021 12:22 PM  Brent Dougherty  has presented today for surgery, with the diagnosis of right hip osteoarthritis.  The various methods of treatment have been discussed with the patient and family. After consideration of risks, benefits and other options for treatment, the patient has consented to  Procedure(s): TOTAL HIP ARTHROPLASTY ANTERIOR APPROACH (Right) as a surgical intervention.  The patient's history has been reviewed, patient examined, no change in status, stable for surgery.  I have reviewed the patient's chart and labs.  Questions were answered to the patient's satisfaction.     Homero Fellers Marisol Giambra

## 2021-08-03 NOTE — Anesthesia Postprocedure Evaluation (Signed)
Anesthesia Post Note  Patient: Brent Dougherty  Procedure(s) Performed: TOTAL HIP ARTHROPLASTY ANTERIOR APPROACH (Right: Hip)     Patient location during evaluation: PACU Anesthesia Type: MAC and Spinal Level of consciousness: awake and alert and oriented Pain management: pain level controlled Vital Signs Assessment: post-procedure vital signs reviewed and stable Respiratory status: spontaneous breathing, nonlabored ventilation and respiratory function stable Cardiovascular status: blood pressure returned to baseline and stable Postop Assessment: no headache, no backache, spinal receding and patient able to bend at knees Anesthetic complications: no   No notable events documented.  Last Vitals:  Vitals:   08/03/21 1620 08/03/21 1630  BP: 104/63 107/70  Pulse:  62  Resp:  13  Temp:    SpO2:  98%    Last Pain:  Vitals:   08/03/21 1241  TempSrc:   PainSc: 0-No pain                 Pervis Hocking

## 2021-08-03 NOTE — Transfer of Care (Signed)
Immediate Anesthesia Transfer of Care Note  Patient: Brent Dougherty  Procedure(s) Performed: TOTAL HIP ARTHROPLASTY ANTERIOR APPROACH (Right: Hip)  Patient Location: PACU  Anesthesia Type:Spinal  Level of Consciousness: sedated  Airway & Oxygen Therapy: Patient Spontanous Breathing and Patient connected to face mask oxygen  Post-op Assessment: Report given to RN and Post -op Vital signs reviewed and stable  Post vital signs: Reviewed and stable  Last Vitals:  Vitals Value Taken Time  BP 97/66 08/03/21 1612  Temp    Pulse 79 08/03/21 1613  Resp 18 08/03/21 1613  SpO2 98 % 08/03/21 1613  Vitals shown include unvalidated device data.  Last Pain:  Vitals:   08/03/21 1241  TempSrc:   PainSc: 0-No pain      Patients Stated Pain Goal: 4 (08/03/21 1241)  Complications: No notable events documented.

## 2021-08-03 NOTE — Anesthesia Procedure Notes (Signed)
Spinal  Patient location during procedure: OR Start time: 08/03/2021 2:13 PM End time: 08/03/2021 2:25 PM Reason for block: surgical anesthesia Staffing Performed: anesthesiologist  Anesthesiologist: Lannie Fields, DO Performed by: Lannie Fields, DO Authorized by: Lannie Fields, DO   Preanesthetic Checklist Completed: patient identified, IV checked, risks and benefits discussed, surgical consent, monitors and equipment checked, pre-op evaluation and timeout performed Spinal Block Patient position: sitting Prep: DuraPrep and site prepped and draped Patient monitoring: cardiac monitor, continuous pulse ox and blood pressure Approach: midline Location: L3-4 Injection technique: single-shot Needle Needle type: Pencan  Needle gauge: 24 G Needle length: 9 cm Assessment Sensory level: T6 Events: CSF return and second provider Additional Notes Functioning IV was confirmed and monitors were applied. Sterile prep and drape, including hand hygiene and sterile gloves were used. The patient was positioned and the spine was prepped. The skin was anesthetized with lidocaine.  Free flow of clear CSF was obtained prior to injecting local anesthetic into the CSF.  The spinal needle aspirated freely following injection.  The needle was carefully withdrawn.  The patient tolerated the procedure well.

## 2021-08-03 NOTE — Op Note (Signed)
OPERATIVE REPORT- TOTAL HIP ARTHROPLASTY   PREOPERATIVE DIAGNOSIS: Osteoarthritis of the Right hip.   POSTOPERATIVE DIAGNOSIS: Osteoarthritis of the Right  hip.   PROCEDURE: Right total hip arthroplasty, anterior approach.   SURGEON: [name], MD   ASSISTANT: Arcola Jansky, PA-C  ANESTHESIA:  Spinal  ESTIMATED BLOOD LOSS:-500 mL    DRAINS: None  COMPLICATIONS: None   CONDITION: PACU - hemodynamically stable.   BRIEF CLINICAL NOTE: Brent Dougherty is a 71 y.o. male who has advanced end-  stage arthritis of their Right  hip with progressively worsening pain and  dysfunction.The patient has failed nonoperative management and presents for  total hip arthroplasty.   PROCEDURE IN DETAIL: After successful administration of spinal  anesthetic, the traction boots for the [facility] bed were placed on both  feet and the patient was placed onto the [facility] bed, boots placed into the leg  holders. The Right hip was then isolated from the perineum with plastic  drapes and prepped and draped in the usual sterile fashion. ASIS and  greater trochanter were marked and a oblique incision was made, starting  at about 1 cm lateral and 2 cm distal to the ASIS and coursing towards  the anterior cortex of the femur. The skin was cut with a 10 blade  through subcutaneous tissue to the level of the fascia overlying the  tensor fascia lata muscle. The fascia was then incised in line with the  incision at the junction of the anterior third and posterior 2/3rd. The  muscle was teased off the fascia and then the interval between the TFL  and the rectus was developed. The Hohmann retractor was then placed at  the top of the femoral neck over the capsule. The vessels overlying the  capsule were cauterized and the fat on top of the capsule was removed.  A Hohmann retractor was then placed anterior underneath the rectus  femoris to give exposure to the entire anterior capsule. A T-shaped   capsulotomy was performed. The edges were tagged and the femoral head  was identified.       Osteophytes are removed off the superior acetabulum.  The femoral neck was then cut in situ with an oscillating saw. Traction  was then applied to the left lower extremity utilizing the [facility]  traction. The femoral head was then removed. Retractors were placed  around the acetabulum and then circumferential removal of the labrum was  performed. Osteophytes were also removed. Reaming starts at 49 mm to  medialize and  Increased in 2 mm increments to 53 mm. We reamed in  approximately 40 degrees of abduction, 20 degrees anteversion. A 54 mm  pinnacle acetabular shell was then impacted in anatomic position under  fluoroscopic guidance with excellent purchase. We did not need to place  any additional dome screws. A 36 mm neutral + 4 marathon liner was then  placed into the acetabular shell.       The femoral lift was then placed along the lateral aspect of the femur  just distal to the vastus ridge. The leg was  externally rotated and capsule  was stripped off the inferior aspect of the femoral neck down to the  level of the lesser trochanter, this was done with electrocautery. The femur was lifted after this was performed. The  leg was then placed in an extended and adducted position essentially delivering the femur. We also removed the capsule superiorly and the piriformis from the piriformis fossa to  gain excellent exposure of the  proximal femur. Rongeur was used to remove some cancellous bone to get  into the lateral portion of the proximal femur for placement of the  initial starter reamer. The starter broaches was placed  the starter broach  and was shown to go down the center of the canal. Broaching  with the Actis system was then performed starting at size 0  coursing  Up to size 7. A size 7 had excellent torsional and rotational  and axial stability. The trial high offset neck was then placed   with a 36 + 1.5 trial head. The hip was then reduced. We confirmed that  the stem was in the canal both on AP and lateral x-rays. It also has excellent sizing. The hip was reduced with outstanding stability through full extension and full external rotation.. AP pelvis was taken and the leg lengths were measured and found to be equal. Hip was then dislocated again and the femoral head and neck removed. The  femoral broach was removed. Size 7 Actis stem with a high offset  neck was then impacted into the femur following native anteversion. Has  excellent purchase in the canal. Excellent torsional and rotational and  axial stability. It is confirmed to be in the canal on AP and lateral  fluoroscopic views. The 36 + 1.5 ceramic head was placed and the hip  reduced with outstanding stability. Again AP pelvis was taken and it  confirmed that the leg lengths were equal. The wound was then copiously  irrigated with saline solution and the capsule reattached and repaired  with Ethibond suture. 30 ml of .25% Bupivicaine was  injected into the capsule and into the edge of the tensor fascia lata as well as subcutaneous tissue. The fascia overlying the tensor fascia lata was then closed with a running #1 V-Loc. Subcu was closed with interrupted 2-0 Vicryl and subcuticular running 4-0 Monocryl. Incision was cleaned  and dried. Steri-Strips and a bulky sterile dressing applied. The patient was awakened and transported to  recovery in stable condition.        Please note that a surgical assistant was a medical necessity for this procedure to perform it in a safe and expeditious manner. Assistant was necessary to provide appropriate retraction of vital neurovascular structures and to prevent femoral fracture and allow for anatomic placement of the prosthesis.  [name], M.D.

## 2021-08-03 NOTE — Discharge Instructions (Addendum)
Brent Arabian, MD Total Joint Specialist [facility] [place] [address], [address] [place], [place] [address] [phone]  ANTERIOR APPROACH TOTAL HIP REPLACEMENT POSTOPERATIVE DIRECTIONS     Hip Rehabilitation, Guidelines Following Surgery  The results of a hip operation are greatly improved after range of motion and muscle strengthening exercises. Follow all safety measures which are given to protect your hip. If any of these exercises cause increased pain or swelling in your joint, decrease the amount until you are comfortable again. Then slowly increase the exercises. Call your caregiver if you have problems or questions.   HOME CARE INSTRUCTIONS  Remove items at home which could result in a fall. This includes throw rugs or furniture in walking pathways.  ICE to the affected hip as frequently as 20-30 minutes an hour and then as needed for pain and swelling. Continue to use ice on the hip for pain and swelling from surgery. You may notice swelling that will progress down to the foot and ankle. This is normal after surgery. Elevate the leg when you are not up walking on it.   Continue to use the breathing machine which will help keep your temperature down.  It is common for your temperature to cycle up and down following surgery, especially at night when you are not up moving around and exerting yourself.  The breathing machine keeps your lungs expanded and your temperature down.  DIET You may resume your previous home diet once your are discharged from the hospital.  DRESSING / WOUND CARE / SHOWERING You have an adhesive waterproof bandage over the incision. Leave this in place until your first follow-up appointment. Once you remove this you will not need to place another bandage.  You may begin showering 3 days following surgery, but do not submerge the incision under water.  ACTIVITY For the first 3-5 days, it is important to rest and keep the operative leg elevated.  You should, as a general rule, rest for 50 minutes and walk/stretch for 10 minutes per hour. After 5 days, you may slowly increase activity as tolerated.  Perform the exercises you were provided twice a day for about 15-20 minutes each session. Begin these 2 days following surgery. Walk with your walker as instructed. Use the walker until you are comfortable transitioning to a cane. Walk with the cane in the opposite hand of the operative leg. You may discontinue the cane once you are comfortable and walking steadily. Avoid periods of inactivity such as sitting longer than an hour when not asleep. This helps prevent blood clots.  Do not drive a car for 6 weeks or until released by your surgeon.  Do not drive while taking narcotics.  TED HOSE STOCKINGS Wear the elastic stockings on both legs for three weeks following surgery during the day. You may remove them at night while sleeping.  WEIGHT BEARING Weight bearing as tolerated with assist device (walker, cane, etc) as directed, use it as long as suggested by your surgeon or therapist, typically at least 4-6 weeks.  POSTOPERATIVE CONSTIPATION PROTOCOL Constipation - defined medically as fewer than three stools per week and severe constipation as less than one stool per week.  One of the most common issues patients have following surgery is constipation.  Even if you have a regular bowel pattern at home, your normal regimen is likely to be disrupted due to multiple reasons following surgery.  Combination of anesthesia, postoperative narcotics, change in appetite and fluid intake all can affect your bowels.  In order to avoid complications following surgery, here are some recommendations in order to help you during your recovery period.  Colace (docusate) - Pick up an over-the-counter form of Colace or another stool softener and take twice a day as long as you are requiring postoperative pain medications.  Take with a full glass of water daily.  If  you experience loose stools or diarrhea, hold the colace until you stool forms back up.  If your symptoms do not get better within 1 week or if they get worse, check with your doctor. Dulcolax (bisacodyl) - Pick up over-the-counter and take as directed by the product packaging as needed to assist with the movement of your bowels.  Take with a full glass of water.  Use this product as needed if not relieved by Colace only.  MiraLax (polyethylene glycol) - Pick up over-the-counter to have on hand.  MiraLax is a solution that will increase the amount of water in your bowels to assist with bowel movements.  Take as directed and can mix with a glass of water, juice, soda, coffee, or tea.  Take if you go more than two days without a movement.Do not use MiraLax more than once per day. Call your doctor if you are still constipated or irregular after using this medication for 7 days in a row.  If you continue to have problems with postoperative constipation, please contact the office for further assistance and recommendations.  If you experience "the worst abdominal pain ever" or develop nausea or vomiting, please contact the office immediatly for further recommendations for treatment.  ITCHING  If you experience itching with your medications, try taking only a single pain pill, or even half a pain pill at a time.  You can also use Benadryl over the counter for itching or also to help with sleep.   MEDICATIONS See your medication summary on the "After Visit Summary" that the nursing staff will review with you prior to discharge.  You may have some home medications which will be placed on hold until you complete the course of blood thinner medication.  It is important for you to complete the blood thinner medication as prescribed by your surgeon.  Continue your approved medications as instructed at time of discharge.  PRECAUTIONS If you experience chest pain or shortness of breath - call 911 immediately for  transfer to the hospital emergency department.  If you develop a fever greater that 101 F, purulent drainage from wound, increased redness or drainage from wound, foul odor from the wound/dressing, or calf pain - CONTACT YOUR SURGEON.                                                   FOLLOW-UP APPOINTMENTS Make sure you keep all of your appointments after your operation with your surgeon and caregivers. You should call the office at the above phone number and make an appointment for approximately two weeks after the date of your surgery or on the date instructed by your surgeon outlined in the "After Visit Summary".  RANGE OF MOTION AND STRENGTHENING EXERCISES  These exercises are designed to help you keep full movement of your hip joint. Follow your caregiver's or physical therapist's instructions. Perform all exercises about fifteen times, three times per day or as directed. Exercise both hips, even if you have had only  one joint replacement. These exercises can be done on a training (exercise) mat, on the floor, on a table or on a bed. Use whatever works the best and is most comfortable for you. Use music or television while you are exercising so that the exercises are a pleasant break in your day. This will make your life better with the exercises acting as a break in routine you can look forward to.  Lying on your back, slowly slide your foot toward your buttocks, raising your knee up off the floor. Then slowly slide your foot back down until your leg is straight again.  Lying on your back spread your legs as far apart as you can without causing discomfort.  Lying on your side, raise your upper leg and foot straight up from the floor as far as is comfortable. Slowly lower the leg and repeat.  Lying on your back, tighten up the muscle in the front of your thigh (quadriceps muscles). You can do this by keeping your leg straight and trying to raise your heel off the floor. This helps strengthen the  largest muscle supporting your knee.  Lying on your back, tighten up the muscles of your buttocks both with the legs straight and with the knee bent at a comfortable angle while keeping your heel on the floor.   POST-OPERATIVE OPIOID TAPER INSTRUCTIONS: It is important to wean off of your opioid medication as soon as possible. If you do not need pain medication after your surgery it is ok to stop day one. Opioids include: Codeine, Hydrocodone(Norco, Vicodin), Oxycodone(Percocet, oxycontin) and hydromorphone amongst others.  Long term and even short term use of opiods can cause: Increased pain response Dependence Constipation Depression Respiratory depression And more.  Withdrawal symptoms can include Flu like symptoms Nausea, vomiting And more Techniques to manage these symptoms Hydrate well Eat regular healthy meals Stay active Use relaxation techniques(deep breathing, meditating, yoga) Do Not substitute Alcohol to help with tapering If you have been on opioids for less than two weeks and do not have pain than it is ok to stop all together.  Plan to wean off of opioids This plan should start within one week post op of your joint replacement. Maintain the same interval or time between taking each dose and first decrease the dose.  Cut the total daily intake of opioids by one tablet each day Next start to increase the time between doses. The last dose that should be eliminated is the evening dose.   IF YOU ARE TRANSFERRED TO A SKILLED REHAB FACILITY If the patient is transferred to a skilled rehab facility following release from the hospital, a list of the current medications will be sent to the facility for the patient to continue.  When discharged from the skilled rehab facility, please have the facility set up the patient's [place] prior to being released. Also, the skilled facility will be responsible for providing the patient with their medications at time of  release from the facility to include their pain medication, the muscle relaxants, and their blood thinner medication. If the patient is still at the rehab facility at time of the two week follow up appointment, the skilled rehab facility will also need to assist the patient in arranging follow up appointment in our office and any transportation needs.  MAKE SURE YOU:  Understand these instructions.  Get help right away if you are not doing well or get worse.    DENTAL ANTIBIOTICS:  In most  cases prophylactic antibiotics for Dental procdeures after total joint surgery are not necessary.  Exceptions are as follows:  1. History of prior total joint infection  2. Severely immunocompromised (Organ Transplant, cancer chemotherapy, Rheumatoid biologic meds such as Humera)  3. Poorly controlled diabetes (A1C &gt; 8.0, blood glucose over 200)  If you have one of these conditions, contact your surgeon for an antibiotic prescription, prior to your dental procedure.    Pick up stool softner and laxative for home use following surgery while on pain medications. Do not submerge incision under water. Please use good hand washing techniques while changing dressing each day. May shower starting three days after surgery. Please use a clean towel to pat the incision dry following showers. Continue to use ice for pain and swelling after surgery. Do not use any lotions or creams on the incision until instructed by your surgeon.   Information on my medicine - XARELTO (Rivaroxaban)  Why was Xarelto prescribed for you? Xarelto was prescribed for you to reduce the risk of blood clots forming after orthopedic surgery. The medical term for these abnormal blood clots is venous thromboembolism (VTE).  What do you need to know about xarelto ? Take your Xarelto ONCE DAILY at the same time every day. You may take it either with or without food.  If you have difficulty swallowing the tablet whole, you  may crush it and mix in applesauce just prior to taking your dose.  Take Xarelto exactly as prescribed by your doctor and DO NOT stop taking Xarelto without talking to the doctor who prescribed the medication.  Stopping without other VTE prevention medication to take the place of Xarelto may increase your risk of developing a clot.  After discharge, you should have regular check-up appointments with your healthcare provider that is prescribing your Xarelto.    What do you do if you miss a dose? If you miss a dose, take it as soon as you remember on the same day then continue your regularly scheduled once daily regimen the next day. Do not take two doses of Xarelto on the same day.   Important Safety Information A possible side effect of Xarelto is bleeding. You should call your healthcare provider right away if you experience any of the following: Bleeding from an injury or your nose that does not stop. Unusual colored urine (red or dark brown) or unusual colored stools (red or black). Unusual bruising for unknown reasons. A serious fall or if you hit your head (even if there is no bleeding).  Some medicines may interact with Xarelto and might increase your risk of bleeding while on Xarelto. To help avoid this, consult your healthcare provider or pharmacist prior to using any new prescription or non-prescription medications, including herbals, vitamins, non-steroidal anti-inflammatory drugs (NSAIDs) and supplements.  This website has more information on Xarelto: VisitDestination.com.br.

## 2021-08-04 ENCOUNTER — Other Ambulatory Visit (HOSPITAL_COMMUNITY): Payer: Self-pay

## 2021-08-04 ENCOUNTER — Encounter (HOSPITAL_COMMUNITY): Payer: Self-pay | Admitting: Orthopedic Surgery

## 2021-08-04 DIAGNOSIS — J449 Chronic obstructive pulmonary disease, unspecified: Secondary | ICD-10-CM | POA: Diagnosis not present

## 2021-08-04 DIAGNOSIS — Z96651 Presence of right artificial knee joint: Secondary | ICD-10-CM | POA: Diagnosis not present

## 2021-08-04 DIAGNOSIS — I4819 Other persistent atrial fibrillation: Secondary | ICD-10-CM | POA: Diagnosis not present

## 2021-08-04 DIAGNOSIS — Z7901 Long term (current) use of anticoagulants: Secondary | ICD-10-CM | POA: Diagnosis not present

## 2021-08-04 DIAGNOSIS — I10 Essential (primary) hypertension: Secondary | ICD-10-CM | POA: Diagnosis not present

## 2021-08-04 DIAGNOSIS — J45909 Unspecified asthma, uncomplicated: Secondary | ICD-10-CM | POA: Diagnosis not present

## 2021-08-04 DIAGNOSIS — Z79899 Other long term (current) drug therapy: Secondary | ICD-10-CM | POA: Diagnosis not present

## 2021-08-04 DIAGNOSIS — M1611 Unilateral primary osteoarthritis, right hip: Secondary | ICD-10-CM | POA: Diagnosis not present

## 2021-08-04 LAB — CBC
HCT: 36.2 % — ABNORMAL LOW (ref 39.0–52.0)
Hemoglobin: 12.3 g/dL — ABNORMAL LOW (ref 13.0–17.0)
MCH: 32 pg (ref 26.0–34.0)
MCHC: 34 g/dL (ref 30.0–36.0)
MCV: 94.3 fL (ref 80.0–100.0)
Platelets: 188 10*3/uL (ref 150–400)
RBC: 3.84 MIL/uL — ABNORMAL LOW (ref 4.22–5.81)
RDW: 13.1 % (ref 11.5–15.5)
WBC: 9.1 10*3/uL (ref 4.0–10.5)
nRBC: 0 % (ref 0.0–0.2)

## 2021-08-04 LAB — BASIC METABOLIC PANEL
Anion gap: 6 (ref 5–15)
BUN: 15 mg/dL (ref 8–23)
CO2: 26 mmol/L (ref 22–32)
Calcium: 9.1 mg/dL (ref 8.9–10.3)
Chloride: 107 mmol/L (ref 98–111)
Creatinine, Ser: 0.74 mg/dL (ref 0.61–1.24)
GFR, Estimated: >60 mL/min (ref >60)
Glucose, Bld: 146 mg/dL — ABNORMAL HIGH (ref 70–99)
Potassium: 4.1 mmol/L (ref 3.5–5.1)
Sodium: 139 mmol/L (ref 135–145)

## 2021-08-04 MED ORDER — METHOCARBAMOL 500 MG PO TABS
500.0000 mg | ORAL_TABLET | Freq: Four times a day (QID) | ORAL | 0 refills | Status: AC | PRN
  Filled 2021-08-04: qty 40, 10d supply, fill #0

## 2021-08-04 MED ORDER — TRAMADOL HCL 50 MG PO TABS
50.0000 mg | ORAL_TABLET | Freq: Four times a day (QID) | ORAL | 0 refills | Status: AC | PRN
  Filled 2021-08-04: qty 40, 5d supply, fill #0

## 2021-08-04 MED ORDER — HYDROCODONE-ACETAMINOPHEN 5-325 MG PO TABS
1.0000 | ORAL_TABLET | Freq: Four times a day (QID) | ORAL | 0 refills | Status: AC | PRN
  Filled 2021-08-04: qty 42, 6d supply, fill #0

## 2021-08-04 NOTE — TOC Transition Note (Signed)
Transition of Care (TOC) - CM/SW Discharge Note  Patient Details  Name: Brent Dougherty MRN: [id number] Date of Birth: 08/08/1950  Transition of Care (TOC) CM/SW Contact:  Megan S Glenn, LCSW Phone Number: 08/04/2021, 9:56 AM  Clinical Narrative: Patient is expected to discharge home after working with PT. CSW met with patient to confirm discharge plan. Patient will go home with a home exercise program (HEP). Patient has a rolling walker, rollator, and cane at home so there are no DME needs at this time. TOC signing off.  Final next level of care: Home/Self Care Barriers to Discharge: No Barriers Identified  Patient Goals and CMS Choice Patient states their goals for this hospitalization and ongoing recovery are:: Discharge home with HEP Choice offered to / list presented to : NA  Discharge Plan and Services        DME Arranged: N/A DME Agency: NA  Readmission Risk Interventions     No data to display         

## 2021-08-04 NOTE — Progress Notes (Signed)
Physical Therapy One Time Treatment Patient Details Name: Brent Dougherty MRN: [id number] DOB: 1950-03-16 Today's Date: 08/04/2021   History of Present Illness Pt is a 71 year old male s/p right THA direct anterior approach on 08/03/21.  PMHx significant for but not limited to afib and multiple right knee surgeries.    PT Comments    Pt ambulated in hallway and performed LE exercises. Pt denies any symptoms with mobility and feels ready for d/c home today.  Pt provided with HEP and had no further questions.    Recommendations for follow up therapy are one component of a multi-disciplinary discharge planning process, led by the attending physician.  Recommendations may be updated based on patient status, additional functional criteria and insurance authorization.  Follow Up Recommendations  Follow physician's recommendations for discharge plan and follow up therapies     Assistance Recommended at Discharge    Patient can return home with the following     Equipment Recommendations  None recommended by PT    Recommendations for Other Services       Precautions / Restrictions Precautions Precautions: Fall Restrictions Weight Bearing Restrictions: No Other Position/Activity Restrictions: WBAT     Mobility  Bed Mobility Overal bed mobility: Needs Assistance Bed Mobility: Supine to Sit     Supine to sit: Supervision     General bed mobility comments: pt in recliner    Transfers Overall transfer level: Needs assistance Equipment used: Rolling walker (2 wheels) Transfers: Sit to/from Stand Sit to Stand: Min guard, Supervision           General transfer comment: for safety, reliant on UE assist    Ambulation/Gait Ambulation/Gait assistance: Min guard, Supervision Gait Distance (Feet): 280 Feet Assistive device: Rolling walker (2 wheels) Gait Pattern/deviations: Step-through pattern, Decreased stride length, Decreased stance time - right       General Gait  Details: verbal cues for sequence, RW positioning, posture   Stairs             Wheelchair Mobility    Modified Rankin (Stroke Patients Only)       Balance                                            Cognition Arousal/Alertness: Awake/alert Behavior During Therapy: WFL for tasks assessed/performed Overall Cognitive Status: Within Functional Limits for tasks assessed                                          Exercises Total Joint Exercises Ankle Circles/Pumps: AROM, Both, 10 reps Quad Sets: AROM, Right, 10 reps Short Arc Quad: AROM, Right, 10 reps Heel Slides: AAROM, Right, 10 reps Hip ABduction/ADduction: AROM, Right, Supine, 10 reps Long Arc Quad: AROM, Right, 10 reps, Standing Knee Flexion: 10 reps, Right, Standing, AROM Marching in Standing: AROM, Right, 10 reps, Standing    General Comments        Pertinent Vitals/Pain Pain Assessment Pain Assessment: 0-10 Pain Score: 3  Pain Location: right knee Pain Descriptors / Indicators: Sore, Aching Pain Intervention(s): Repositioned, Monitored during session, Ice applied    Home Living Family/patient expects to be discharged to:: Private residence Living Arrangements: Spouse/significant other Available Help at Discharge: Family Type of Home: House Home Access: Level entry  Home Layout: One level Home Equipment: Agricultural consultant (2 wheels);Cane - single point      Prior Function            PT Goals (current goals can now be found in the care plan section) Acute Rehab PT Goals PT Goal Formulation: With patient Time For Goal Achievement: 08/11/21 Potential to Achieve Goals: Good Progress towards PT goals: Progressing toward goals    Frequency    7X/week      PT Plan Current plan remains appropriate    Co-evaluation              AM-PAC PT "6 Clicks" Mobility   Outcome Measure  Help needed turning from your back to your side while in a flat bed  without using bedrails?: A Little Help needed moving from lying on your back to sitting on the side of a flat bed without using bedrails?: A Little Help needed moving to and from a bed to a chair (including a wheelchair)?: A Little Help needed standing up from a chair using your arms (e.g., wheelchair or bedside chair)?: A Little Help needed to walk in hospital room?: A Little Help needed climbing 3-5 steps with a railing? : A Little 6 Click Score: 18    End of Session Equipment Utilized During Treatment: Gait belt Activity Tolerance: Patient tolerated treatment well Patient left: in chair;with call bell/phone within reach Nurse Communication: Mobility status PT Visit Diagnosis: Difficulty in walking, not elsewhere classified (R26.2)     Time: 1355-1411 PT Time Calculation (min) (ACUTE ONLY): 16 min  Charges:  $Therapeutic Exercise: 8-22 mins                     Thomasene Mohair PT, DPT Acute Rehabilitation Services Pager: [phone] Office: [phone]    Janan Halter [place] 08/04/2021, 3:05 PM

## 2021-08-04 NOTE — Progress Notes (Signed)
   Subjective: 1 Day Post-Op Procedure(s) (LRB): TOTAL HIP ARTHROPLASTY ANTERIOR APPROACH (Right) Patient reports pain as mild.   Patient seen in rounds by Dr. Lequita Halt. Patient is well, and has had no acute complaints or problems. Denies chest pain or SOB. No issues overnight. Foley catheter removed this AM. We will begin therapy today.  Objective: Vital signs in last 24 hours: Temp:  [96.8 F (36 C)-98.7 F (37.1 C)] 97.9 F (36.6 C) (06/20 0610) Pulse Rate:  [62-88] 71 (06/20 0610) Resp:  [12-19] 17 (06/20 0610) BP: (89-138)/(46-92) 105/63 (06/20 0610) SpO2:  [94 %-100 %] 96 % (06/20 0610) Weight:  [126.1 kg] 126.1 kg (06/19 1241)  Intake/Output from previous day:  Intake/Output Summary (Last 24 hours) at 08/04/2021 0804 Last data filed at 08/04/2021 0600 Gross per 24 hour  Intake 2393.3 ml  Output 2200 ml  Net 193.3 ml     Intake/Output this shift: No intake/output data recorded.  Labs: Recent Labs    08/04/21 0353  HGB 12.3*   Recent Labs    08/04/21 0353  WBC 9.1  RBC 3.84*  HCT 36.2*  PLT 188   Recent Labs    08/04/21 0353  NA 139  K 4.1  CL 107  CO2 26  BUN 15  CREATININE 0.74  GLUCOSE 146*  CALCIUM 9.1   No results for input(s): "LABPT", "INR" in the last 72 hours.  Exam: General - Patient is Alert and Oriented Extremity - Neurologically intact Neurovascular intact Sensation intact distally Dorsiflexion/Plantar flexion intact Dressing - dressing C/D/I Motor Function - intact, moving foot and toes well on exam.   Past Medical History:  Diagnosis Date   Arthritis    BOTH KNEES   Asthma    uncomplicated   Atrial fibrillation (HCC) 01/2017   Bronchitis    LAST TIME WAS JAN 2014   Chronic insomnia 11/14/2015   Chronic left shoulder pain 01/19/2016   Dysrhythmia    Edema    Elevated blood pressure reading    History of blood transfusion 07/27/2017   History of kidney stones    Hypertension    Morbid obesity (HCC) 11/14/2015    Obstructive chronic bronchitis without exacerbation (HCC) 03/14/2008   OSA on CPAP    not in use   Osteoarthritis of knee 11/14/2015   PONV (postoperative nausea and vomiting)     Assessment/Plan: 1 Day Post-Op Procedure(s) (LRB): TOTAL HIP ARTHROPLASTY ANTERIOR APPROACH (Right) Principal Problem:   OA (osteoarthritis) of hip Active Problems:   Osteoarthritis of right hip  Estimated body mass index is 36.68 kg/m as calculated from the following:   Height as of this encounter: 6\' 1"  (1.854 m).   Weight as of this encounter: 126.1 kg. Advance diet Up with therapy  DVT Prophylaxis - Xarelto Weight bearing as tolerated. Begin therapy.  BP a little soft this AM. If has any issues with dizziness with PT today will give bolus.   Plan is to go Home after hospital stay. Plan for discharge with HEP later today if progresses with therapy and meeting goals. Follow-up in the office in 2 weeks.  The PDMP database was reviewed today prior to any opioid medications being prescribed to this patient.  , PA-C Orthopedic Surgery [phone] 08/04/2021, 8:04 AM

## 2021-08-04 NOTE — Plan of Care (Signed)
All discharge medications were given to the Pt. All questions were answered. Discussed pain management. 

## 2021-08-04 NOTE — Evaluation (Signed)
Physical Therapy Evaluation Patient Details Name: Brent Dougherty MRN: [id number] DOB: 1950/03/27 Today's Date: 08/04/2021  History of Present Illness  Pt is a 71 year old male s/p right THA direct anterior approach on 08/03/21.  PMHx significant for but not limited to afib and multiple right knee surgeries.  Clinical Impression  Pt is s/p THA resulting in the deficits listed below (see PT Problem List).  Pt will benefit from skilled PT to increase their independence and safety with mobility to allow discharge to the venue listed below.  Pt assisted with ambulating in hallway and performed a few standing exercises.  Pt denies dizziness this session.  Pt anticipates d/c home later today and plans to have HEP.        Recommendations for follow up therapy are one component of a multi-disciplinary discharge planning process, led by the attending physician.  Recommendations may be updated based on patient status, additional functional criteria and insurance authorization.  Follow Up Recommendations Follow physician's recommendations for discharge plan and follow up therapies    Assistance Recommended at Discharge    Patient can return home with the following       Equipment Recommendations None recommended by PT  Recommendations for Other Services       Functional Status Assessment Patient has had a recent decline in their functional status and demonstrates the ability to make significant improvements in function in a reasonable and predictable amount of time.     Precautions / Restrictions Precautions Precautions: Fall Restrictions Weight Bearing Restrictions: No Other Position/Activity Restrictions: WBAT      Mobility  Bed Mobility Overal bed mobility: Needs Assistance Bed Mobility: Supine to Sit     Supine to sit: Supervision          Transfers Overall transfer level: Needs assistance Equipment used: Rolling walker (2 wheels) Transfers: Sit to/from Stand Sit to Stand:  Min guard           General transfer comment: min/guard for safety, cue for hand placement    Ambulation/Gait Ambulation/Gait assistance: Min guard Gait Distance (Feet): 160 Feet Assistive device: Rolling walker (2 wheels) Gait Pattern/deviations: Step-through pattern, Decreased stride length, Decreased stance time - right       General Gait Details: verbal cues for sequence, RW positioning, posture  Stairs            Wheelchair Mobility    Modified Rankin (Stroke Patients Only)       Balance                                             Pertinent Vitals/Pain Pain Assessment Pain Assessment: 0-10 Pain Score: 5  Pain Location: right hip with movement Pain Descriptors / Indicators: Sore, Aching Pain Intervention(s): Repositioned, Monitored during session    Home Living Family/patient expects to be discharged to:: Private residence Living Arrangements: Spouse/significant other Available Help at Discharge: Family Type of Home: House Home Access: Level entry       Home Layout: One level Home Equipment: Agricultural consultant (2 wheels);Cane - single point      Prior Function Prior Level of Function : Independent/Modified Independent             Mobility Comments: reports needing to use RW 2 weeks prior to surgery       Hand Dominance        Extremity/Trunk Assessment  Lower Extremity Assessment Lower Extremity Assessment: LLE deficits/detail LLE Deficits / Details: anticipated post op hip weakness observed, hip grossly at least 2+/5       Communication   Communication: No difficulties  Cognition Arousal/Alertness: Awake/alert Behavior During Therapy: WFL for tasks assessed/performed Overall Cognitive Status: Within Functional Limits for tasks assessed                                          General Comments      Exercises Total Joint Exercises Ankle Circles/Pumps: AROM, Both, 10 reps Hip  ABduction/ADduction: AROM, Standing, Right, 10 reps Long Arc Quad: AROM, Right, 10 reps, Standing Knee Flexion: 10 reps, Right, Standing, AROM Marching in Standing: AROM, Right, 10 reps, Standing   Assessment/Plan    PT Assessment Patient needs continued PT services  PT Problem List Decreased strength;Decreased activity tolerance;Decreased mobility;Decreased range of motion;Decreased knowledge of use of DME       PT Treatment Interventions Stair training;Gait training;DME instruction;Therapeutic exercise;Functional mobility training;Therapeutic activities;Patient/family education    PT Goals (Current goals can be found in the Care Plan section)  Acute Rehab PT Goals PT Goal Formulation: With patient Time For Goal Achievement: 08/11/21 Potential to Achieve Goals: Good    Frequency 7X/week     Co-evaluation               AM-PAC PT "6 Clicks" Mobility  Outcome Measure Help needed turning from your back to your side while in a flat bed without using bedrails?: A Little Help needed moving from lying on your back to sitting on the side of a flat bed without using bedrails?: A Little Help needed moving to and from a bed to a chair (including a wheelchair)?: A Little Help needed standing up from a chair using your arms (e.g., wheelchair or bedside chair)?: A Little Help needed to walk in hospital room?: A Little Help needed climbing 3-5 steps with a railing? : A Little 6 Click Score: 18    End of Session Equipment Utilized During Treatment: Gait belt Activity Tolerance: Patient tolerated treatment well Patient left: in chair;with call bell/phone within reach   PT Visit Diagnosis: Difficulty in walking, not elsewhere classified (R26.2)    Time: 7510-2585 PT Time Calculation (min) (ACUTE ONLY): 13 min   Charges:   PT Evaluation $PT Eval Low Complexity: 1 Low        Kati PT, DPT Acute Rehabilitation Services Pager: [phone] Office: [phone]   Kati L  Payson 08/04/2021, 1:25 PM

## 2021-08-05 NOTE — Discharge Summary (Signed)
Patient ID: Brent Dougherty MRN: [id number] DOB/AGE: 1950/07/17 71 y.o.  Admit date: 08/03/2021 Discharge date: 08/04/2021  Admission Diagnoses:  Principal Problem:   OA (osteoarthritis) of hip Active Problems:   Osteoarthritis of right hip   Discharge Diagnoses:  Same  Past Medical History:  Diagnosis Date   Arthritis    BOTH KNEES   Asthma    uncomplicated   Atrial fibrillation ([place]) 01/2017   Bronchitis    LAST TIME WAS JAN 2014   Chronic insomnia 11/14/2015   Chronic left shoulder pain 01/19/2016   Dysrhythmia    Edema    Elevated blood pressure reading    History of blood transfusion 07/27/2017   History of kidney stones    Hypertension    Morbid obesity ([place]) 11/14/2015   Obstructive chronic bronchitis without exacerbation ([place]) 03/14/2008   OSA on CPAP    not in use   Osteoarthritis of knee 11/14/2015   PONV (postoperative nausea and vomiting)     Surgeries: Procedure(s): TOTAL HIP ARTHROPLASTY ANTERIOR APPROACH on 08/03/2021   Consultants:   Discharged Condition: Improved  Hospital Course: Brent Dougherty is an 71 y.o. male who was admitted 08/03/2021 for operative treatment ofOA (osteoarthritis) of hip. Patient has severe unremitting pain that affects sleep, daily activities, and work/hobbies. After pre-op clearance the patient was taken to the operating room on 08/03/2021 and underwent  Procedure(s): TOTAL HIP ARTHROPLASTY ANTERIOR APPROACH.    Patient was given perioperative antibiotics:  Anti-infectives (From admission, onward)    Start     Dose/Rate Route Frequency Ordered Stop   08/03/21 2030  ceFAZolin (ANCEF) IVPB 2g/100 mL premix        2 g 200 mL/hr over 30 Minutes Intravenous Every 6 hours 08/03/21 1916 08/04/21 0400   08/03/21 1215  ceFAZolin (ANCEF) IVPB 2g/100 mL premix        2 g 200 mL/hr over 30 Minutes Intravenous On call to O.R. 08/03/21 1208 08/03/21 1457        Patient was given sequential compression devices, early ambulation,  and chemoprophylaxis to prevent DVT.  Patient benefited maximally from hospital stay and there were no complications.    Recent vital signs: Patient Vitals for the past 24 hrs:  BP Temp Temp src Pulse Resp SpO2  08/04/21 1318 112/64 98.1 F (36.7 C) Oral 78 18 96 %  08/04/21 0948 104/65 97.7 F (36.5 C) Oral 78 17 99 %     Recent laboratory studies:  Recent Labs    08/04/21 0353  WBC 9.1  HGB 12.3*  HCT 36.2*  PLT 188  NA 139  K 4.1  CL 107  CO2 26  BUN 15  CREATININE 0.74  GLUCOSE 146*  CALCIUM 9.1     Discharge Medications:   Allergies as of 08/04/2021       Reactions   Morphine And Related Other (See Comments)   MILD NAUSEA        Medication List     TAKE these medications    acetaminophen 500 MG tablet Commonly known as: TYLENOL Take 1,000 mg by mouth every 4 (four) hours as needed (pain.). For surgical knee pain as needed   diphenhydramine-acetaminophen 25-500 MG Tabs tablet Commonly known as: TYLENOL PM Take 2 tablets by mouth at bedtime as needed (sleep).   hydrochlorothiazide 12.5 MG capsule Commonly known as: MICROZIDE Take 1 capsule (12.5 mg total) by mouth 2 (two) times daily.   HYDROcodone-acetaminophen 5-325 MG tablet Commonly known as: NORCO/VICODIN Take 1-2  tablets by mouth every 6 (six) hours as needed for severe pain.   methocarbamol 500 MG tablet Commonly known as: ROBAXIN Take 1 tablet (500 mg total) by mouth every 6 (six) hours as needed for muscle spasms.   metoprolol tartrate 25 MG tablet Commonly known as: LOPRESSOR Take 1 tablet (25 mg total) by mouth 2 (two) times daily.   sildenafil 100 MG tablet Commonly known as: VIAGRA Take 50 mg by mouth daily as needed for erectile dysfunction.   traMADol 50 MG tablet Commonly known as: ULTRAM Take 1-2 tablets (50-100 mg total) by mouth every 6 (six) hours as needed for moderate pain.   Xarelto 20 MG Tabs tablet Generic drug: rivaroxaban TAKE 1 TABLET DAILY WITH SUPPER                Discharge Care Instructions  (From admission, onward)           Start     Ordered   08/04/21 0000  Weight bearing as tolerated        08/04/21 0807   08/04/21 0000  Change dressing       Comments: You have an adhesive waterproof bandage over the incision. Leave this in place until your first follow-up appointment. Once you remove this you will not need to place another bandage.   08/04/21 0807            Diagnostic Studies: DG Pelvis Portable  Result Date: 08/03/2021 CLINICAL DATA:  Status post right hip replacement. EXAM: PORTABLE PELVIS 1-2 VIEWS COMPARISON:  None available FINDINGS: Frontal view of the bilateral hips. Status post right hip arthroplasty. No perihardware lucency is seen to indicate hardware failure or loosening. Expected postoperative changes including lateral hip and thigh subcutaneous air and soft tissue swelling. No acute fracture or dislocation. Mild left femoroacetabular joint space narrowing. IMPRESSION: Status post recent total right hip arthroplasty without evidence of hardware failure. Electronically Signed   By: Neita Garnet M.D.   On: 08/03/2021 16:56   DG HIP UNILAT WITH PELVIS 1V RIGHT  Result Date: 08/03/2021 CLINICAL DATA:  Right total hip arthroplasty. EXAM: DG HIP (WITH OR WITHOUT PELVIS) 1V RIGHT COMPARISON:  None Available. FINDINGS: Three images obtained portably in the operating room via C-arm radiography show right total hip arthroplasty. Hardware components appear to be in anatomic alignment without signs of periprosthetic fracture or dislocation. IMPRESSION: Status post right total hip arthroplasty. Electronically Signed   By: Signa Kell M.D.   On: 08/03/2021 16:01   DG C-Arm 1-60 Min-No Report  Result Date: 08/03/2021 Fluoroscopy was utilized by the requesting physician.  No radiographic interpretation.   DG C-Arm 1-60 Min-No Report  Result Date: 08/03/2021 Fluoroscopy was utilized by the requesting physician.  No  radiographic interpretation.    Disposition: Discharge disposition: 01-Home or Self Care       Discharge Instructions     Call MD / Call 911   Complete by: As directed    If you experience chest pain or shortness of breath, CALL 911 and be transported to the hospital emergency room.  If you develope a fever above 101 F, pus (white drainage) or increased drainage or redness at the wound, or calf pain, call your surgeon's office.   Change dressing   Complete by: As directed    You have an adhesive waterproof bandage over the incision. Leave this in place until your first follow-up appointment. Once you remove this you will not need to place another bandage.  Constipation Prevention   Complete by: As directed    Drink plenty of fluids.  Prune juice may be helpful.  You may use a stool softener, such as Colace (over the counter) 100 mg twice a day.  Use MiraLax (over the counter) for constipation as needed.   Diet - low sodium heart healthy   Complete by: As directed    Do not sit on low chairs, stoools or toilet seats, as it may be difficult to get up from low surfaces   Complete by: As directed    Driving restrictions   Complete by: As directed    No driving for two weeks   Post-operative opioid taper instructions:   Complete by: As directed    POST-OPERATIVE OPIOID TAPER INSTRUCTIONS: It is important to wean off of your opioid medication as soon as possible. If you do not need pain medication after your surgery it is ok to stop day one. Opioids include: Codeine, Hydrocodone(Norco, Vicodin), Oxycodone(Percocet, oxycontin) and hydromorphone amongst others.  Long term and even short term use of opiods can cause: Increased pain response Dependence Constipation Depression Respiratory depression And more.  Withdrawal symptoms can include Flu like symptoms Nausea, vomiting And more Techniques to manage these symptoms Hydrate well Eat regular healthy meals Stay active Use  relaxation techniques(deep breathing, meditating, yoga) Do Not substitute Alcohol to help with tapering If you have been on opioids for less than two weeks and do not have pain than it is ok to stop all together.  Plan to wean off of opioids This plan should start within one week post op of your joint replacement. Maintain the same interval or time between taking each dose and first decrease the dose.  Cut the total daily intake of opioids by one tablet each day Next start to increase the time between doses. The last dose that should be eliminated is the evening dose.      TED hose   Complete by: As directed    Use stockings (TED hose) for three weeks on both leg(s).  You may remove them at night for sleeping.   Weight bearing as tolerated   Complete by: As directed         Follow-up Information     Aluisio, Homero Fellers, MD. Schedule an appointment as soon as possible for a visit in 2 week(s).   Specialty: Orthopedic Surgery Contact information: [address] [place] [address] [phone]                  Signed: Arther Abbott 08/05/2021, 8:19 AM

## 2021-09-10 DIAGNOSIS — Z4789 Encounter for other orthopedic aftercare: Secondary | ICD-10-CM | POA: Diagnosis not present

## 2021-09-15 ENCOUNTER — Other Ambulatory Visit (HOSPITAL_COMMUNITY): Payer: Self-pay

## 2021-10-07 DIAGNOSIS — H5203 Hypermetropia, bilateral: Secondary | ICD-10-CM | POA: Diagnosis not present

## 2021-12-14 ENCOUNTER — Other Ambulatory Visit: Payer: Self-pay | Admitting: Cardiovascular Disease

## 2021-12-14 ENCOUNTER — Other Ambulatory Visit (HOSPITAL_COMMUNITY): Payer: Self-pay

## 2021-12-14 MED ORDER — HYDROCHLOROTHIAZIDE 12.5 MG PO CAPS
12.5000 mg | ORAL_CAPSULE | Freq: Two times a day (BID) | ORAL | 1 refills | Status: AC
  Filled 2021-12-14: qty 120, 60d supply, fill #0

## 2021-12-18 ENCOUNTER — Other Ambulatory Visit (HOSPITAL_COMMUNITY): Payer: Self-pay

## 2022-02-01 DIAGNOSIS — Z125 Encounter for screening for malignant neoplasm of prostate: Secondary | ICD-10-CM | POA: Diagnosis not present

## 2022-02-01 DIAGNOSIS — Z0000 Encounter for general adult medical examination without abnormal findings: Secondary | ICD-10-CM | POA: Diagnosis not present

## 2022-02-16 DIAGNOSIS — I10 Essential (primary) hypertension: Secondary | ICD-10-CM | POA: Diagnosis not present

## 2022-02-16 DIAGNOSIS — N5201 Erectile dysfunction due to arterial insufficiency: Secondary | ICD-10-CM | POA: Diagnosis not present

## 2022-02-16 DIAGNOSIS — I4819 Other persistent atrial fibrillation: Secondary | ICD-10-CM | POA: Diagnosis not present

## 2022-02-16 DIAGNOSIS — J4489 Other specified chronic obstructive pulmonary disease: Secondary | ICD-10-CM | POA: Diagnosis not present

## 2022-02-16 DIAGNOSIS — Z0000 Encounter for general adult medical examination without abnormal findings: Secondary | ICD-10-CM | POA: Diagnosis not present

## 2022-08-31 ENCOUNTER — Other Ambulatory Visit (HOSPITAL_COMMUNITY): Payer: Self-pay

## 2023-01-04 IMAGING — DX DG PORTABLE PELVIS
1 series · 1 of 1 positions shown · non-contrast
Comparison: None available

CLINICAL DATA: Status post right hip replacement.

EXAM:
PORTABLE PELVIS 1-2 VIEWS

[pelvis ap]
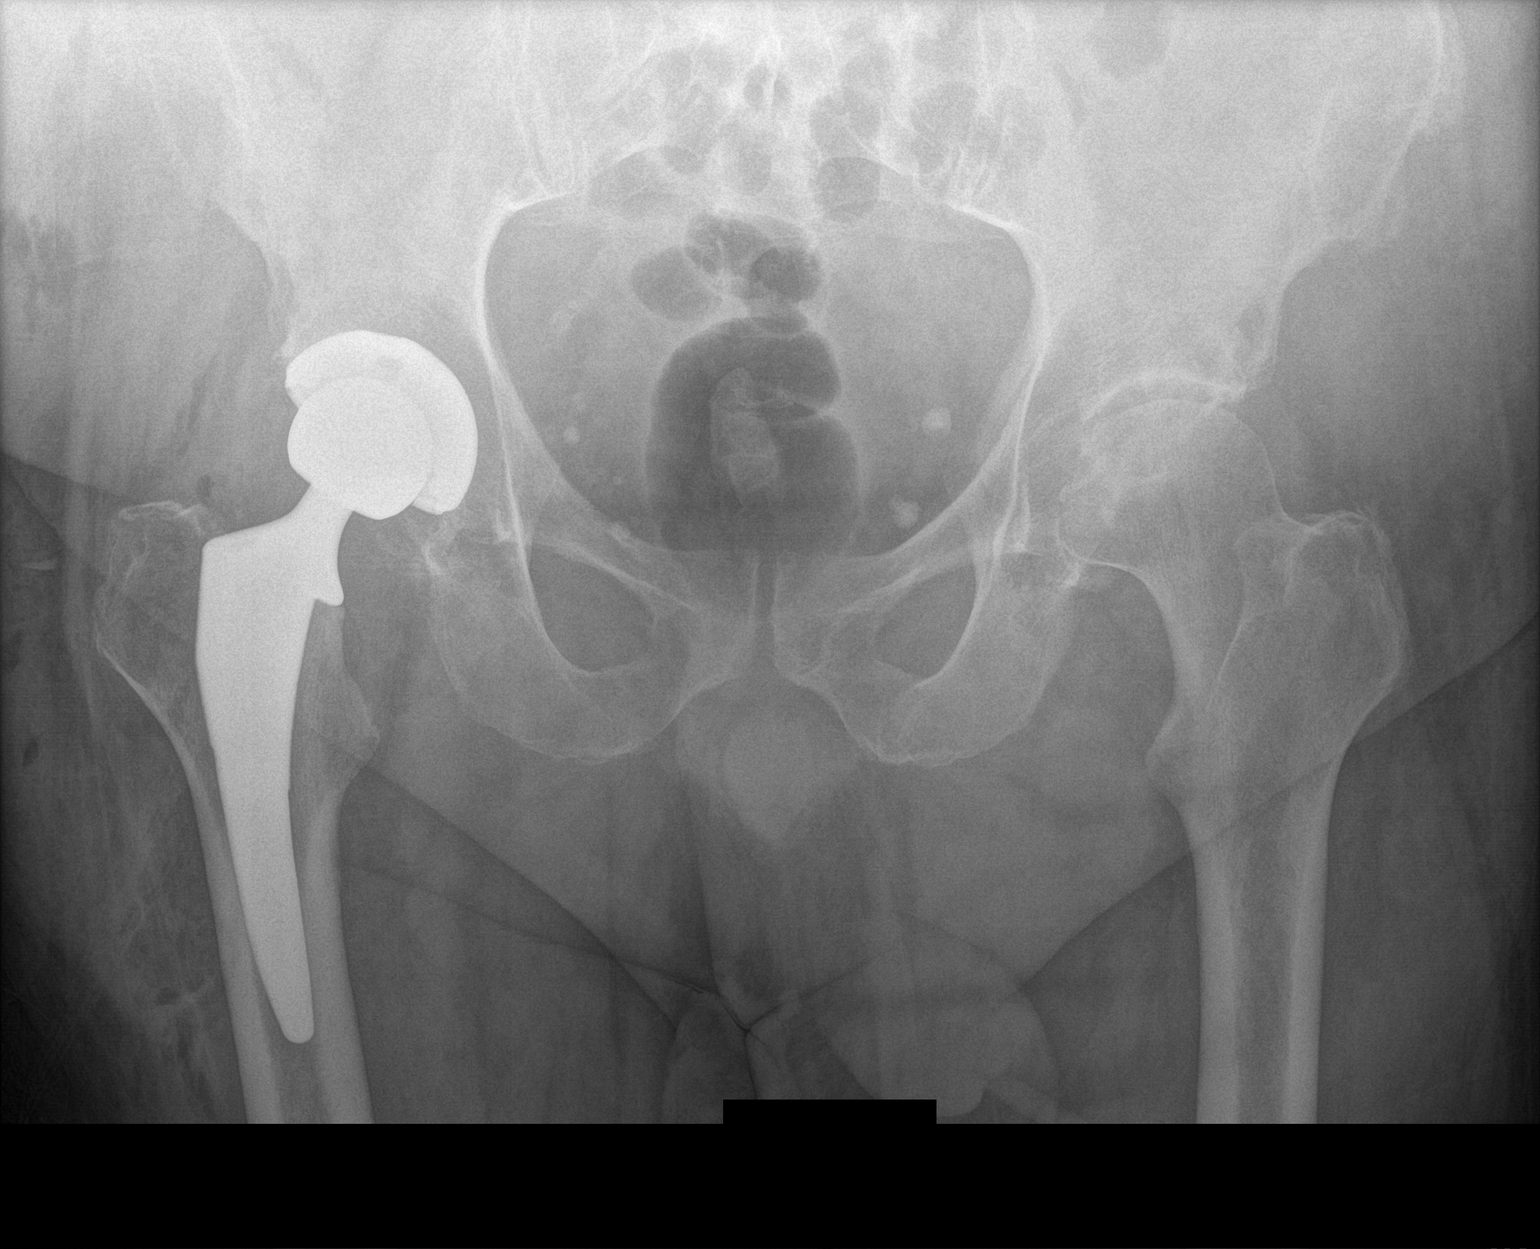

[1 of 1 positions shown; findings below may reference images not displayed]

FINDINGS: Frontal view of the bilateral hips. Status post right hip
arthroplasty. No perihardware lucency is seen to indicate hardware
failure or loosening. Expected postoperative changes including
lateral hip and thigh subcutaneous air and soft tissue swelling. No
acute fracture or dislocation. Mild left femoroacetabular joint
space narrowing.
IMPRESSION: Status post recent total right hip arthroplasty without evidence of
hardware failure.

## 2023-03-08 DIAGNOSIS — Z0000 Encounter for general adult medical examination without abnormal findings: Secondary | ICD-10-CM | POA: Diagnosis not present

## 2023-03-10 ENCOUNTER — Other Ambulatory Visit (HOSPITAL_COMMUNITY): Payer: Self-pay

## 2023-03-10 DIAGNOSIS — Z0000 Encounter for general adult medical examination without abnormal findings: Secondary | ICD-10-CM | POA: Diagnosis not present

## 2023-03-10 DIAGNOSIS — E66812 Obesity, class 2: Secondary | ICD-10-CM | POA: Diagnosis not present

## 2023-03-10 DIAGNOSIS — Z6836 Body mass index (BMI) 36.0-36.9, adult: Secondary | ICD-10-CM | POA: Diagnosis not present

## 2023-03-10 DIAGNOSIS — N529 Male erectile dysfunction, unspecified: Secondary | ICD-10-CM | POA: Diagnosis not present

## 2023-03-10 DIAGNOSIS — I4819 Other persistent atrial fibrillation: Secondary | ICD-10-CM | POA: Diagnosis not present

## 2023-03-10 DIAGNOSIS — I10 Essential (primary) hypertension: Secondary | ICD-10-CM | POA: Diagnosis not present

## 2023-03-10 MED ORDER — TADALAFIL 20 MG PO TABS
20.0000 mg | ORAL_TABLET | Freq: Every day | ORAL | 3 refills | Status: DC | PRN
Start: 2023-03-10 — End: 2023-03-10
  Filled 2023-03-10: qty 30, 30d supply, fill #0

## 2023-03-17 LAB — COLOGUARD

## 2023-09-02 ENCOUNTER — Encounter: Payer: Self-pay | Admitting: Advanced Practice Midwife
